# Patient Record
Sex: Female | Born: 1990
Health system: Southern US, Community
[De-identification: ages and names within clinical notes are randomized; demographics above are authoritative.]

## PROBLEM LIST (undated history)

## (undated) ENCOUNTER — Inpatient Hospital Stay (HOSPITAL_COMMUNITY): Payer: Self-pay

## (undated) DIAGNOSIS — Z3481 Encounter for supervision of other normal pregnancy, first trimester: Secondary | ICD-10-CM

## (undated) DIAGNOSIS — F329 Major depressive disorder, single episode, unspecified: Secondary | ICD-10-CM

## (undated) DIAGNOSIS — O1490 Unspecified pre-eclampsia, unspecified trimester: Secondary | ICD-10-CM

## (undated) DIAGNOSIS — F32A Depression, unspecified: Secondary | ICD-10-CM

## (undated) DIAGNOSIS — R011 Cardiac murmur, unspecified: Secondary | ICD-10-CM

## (undated) DIAGNOSIS — E7800 Pure hypercholesterolemia, unspecified: Secondary | ICD-10-CM

## (undated) HISTORY — DX: Cardiac murmur, unspecified: R01.1

## (undated) HISTORY — DX: Major depressive disorder, single episode, unspecified: F32.9

## (undated) HISTORY — DX: Unspecified pre-eclampsia, unspecified trimester: O14.90

## (undated) HISTORY — DX: Encounter for supervision of other normal pregnancy, first trimester: Z34.81

## (undated) HISTORY — DX: Pure hypercholesterolemia, unspecified: E78.00

## (undated) HISTORY — PX: WISDOM TOOTH EXTRACTION: SHX21

## (undated) HISTORY — DX: Depression, unspecified: F32.A

---
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
## 2012-04-23 ENCOUNTER — Emergency Department (HOSPITAL_COMMUNITY)
Admission: EM | Admit: 2012-04-23 | Discharge: 2012-04-23 | Disposition: A | Discharge status: Home / Self Care | Payer: Medicaid Other | Source: Home / Self Care

## 2012-06-05 ENCOUNTER — Encounter: Payer: Medicaid Other | Admitting: Family

## 2012-12-11 ENCOUNTER — Encounter: Payer: Self-pay | Admitting: Obstetrics & Gynecology

## 2012-12-11 ENCOUNTER — Ambulatory Visit (INDEPENDENT_AMBULATORY_CARE_PROVIDER_SITE_OTHER): Payer: Medicaid Other

## 2012-12-30 ENCOUNTER — Ambulatory Visit (INDEPENDENT_AMBULATORY_CARE_PROVIDER_SITE_OTHER): Payer: Medicaid Other

## 2012-12-30 ENCOUNTER — Encounter: Payer: Self-pay | Admitting: Advanced Practice Midwife

## 2012-12-30 ENCOUNTER — Ambulatory Visit (INDEPENDENT_AMBULATORY_CARE_PROVIDER_SITE_OTHER): Payer: Medicaid Other | Admitting: Advanced Practice Midwife

## 2012-12-30 ENCOUNTER — Other Ambulatory Visit: Payer: Medicaid Other

## 2012-12-30 ENCOUNTER — Other Ambulatory Visit: Payer: Self-pay | Admitting: Advanced Practice Midwife

## 2012-12-30 VITALS — BP 116/69 | Temp 98.3°F | Ht 63.5 in | Wt 148.2 lb

## 2012-12-30 DIAGNOSIS — Z3481 Encounter for supervision of other normal pregnancy, first trimester: Secondary | ICD-10-CM

## 2012-12-30 DIAGNOSIS — O365990 Maternal care for other known or suspected poor fetal growth, unspecified trimester, not applicable or unspecified: Secondary | ICD-10-CM

## 2012-12-30 DIAGNOSIS — N926 Irregular menstruation, unspecified: Secondary | ICD-10-CM

## 2012-12-30 DIAGNOSIS — O365911 Maternal care for other known or suspected poor fetal growth, first trimester, fetus 1: Secondary | ICD-10-CM

## 2012-12-30 DIAGNOSIS — Z8349 Family history of other endocrine, nutritional and metabolic diseases: Secondary | ICD-10-CM

## 2012-12-30 DIAGNOSIS — Z3480 Encounter for supervision of other normal pregnancy, unspecified trimester: Secondary | ICD-10-CM | POA: Insufficient documentation

## 2012-12-30 DIAGNOSIS — Z3201 Encounter for pregnancy test, result positive: Secondary | ICD-10-CM

## 2012-12-30 LAB — POCT URINALYSIS DIPSTICK
Blood, UA: NEGATIVE
Glucose, UA: NEGATIVE
Ketones, UA: NEGATIVE
Spec Grav, UA: 1.01
Urobilinogen, UA: NEGATIVE

## 2012-12-30 LAB — US OB COMP LESS 14 WKS

## 2012-12-30 NOTE — Addendum Note (Signed)
Addended by: George Hugh on: 12/30/2012 03:50 PM   Modules accepted: Orders

## 2012-12-30 NOTE — Progress Notes (Signed)
  Pulse- 83  Subjective:    Erin Ruiz is a G2P1001 [redacted]w[redacted]d being seen today for her first obstetrical visit.  Her obstetrical history is significant for NA. Patient does intend to breast feed. Pregnancy history fully reviewed.  Patient reports no complaints.  Filed Vitals:   12/30/12 1321 12/30/12 1322  BP: 116/69   Temp: 98.3 F (36.8 C)   Height:  5' 3.5" (1.613 m)  Weight: 148 lb 3.2 oz (67.223 kg)     HISTORY: OB History  Gravida Para Term Preterm AB SAB TAB Ectopic Multiple Living  2 1 1       1     # Outcome Date GA Lbr Len/2nd Weight Sex Delivery Anes PTL Lv  2 CUR           1 TRM 12/30/09 [redacted]w[redacted]d  7 lb 1 oz (3.204 kg) F SVD None  Y     Comments: No complications     Past Medical History  Diagnosis Date  . Heart murmur    Past Surgical History  Procedure Laterality Date  . Tonsillectomy     Family History  Problem Relation Age of Onset  . Hypertension Mother   . Asthma Father      Exam    Filed Vitals:   12/30/12 1321  BP: 116/69  Temp: 98.3 F (36.8 C)   Filed Vitals:   12/30/12 1322  Height: 5' 3.5" (1.613 m)  Weight:    Pelvic US pending   Assessment:    Pregnancy: G2P1001 Patient Active Problem List   Diagnosis Date Noted  . Irregular menses 12/30/2012  . Supervision of other normal pregnancy 12/30/2012        Plan:     Initial labs drawn. Prenatal vitamins. Problem list reviewed and updated. Genetic Screening discussed Quad Screen: at subsequent visits.  Ultrasound discussed; fetal survey: requested.  Follow up in 4 weeks. 80% of 30 min visit spent on counseling and coordination of care.  Patient needs GC/Ct and Pap NV (unable to today due to toddler in room) Dating [place] pending CF ordered w/ NOB labs due to family hx Make certain to assess murmur w/ PE NV.    Erin Ruiz 12/30/2012

## 2012-12-31 ENCOUNTER — Encounter: Payer: Self-pay | Admitting: Advanced Practice Midwife

## 2012-12-31 LAB — OBSTETRIC PANEL
Basophils Absolute: 0 10*3/uL (ref 0.0–0.1)
Eosinophils Relative: 1 % (ref 0–5)
Hemoglobin: 12.2 g/dL (ref 12.0–15.0)
Hepatitis B Surface Ag: NEGATIVE
Lymphocytes Relative: 19 % (ref 12–46)
Lymphs Abs: 2.1 10*3/uL (ref 0.7–4.0)
MCV: 88.3 fL (ref 78.0–100.0)
Neutro Abs: 7.7 10*3/uL (ref 1.7–7.7)
Neutrophils Relative %: 73 % (ref 43–77)
Platelets: 214 10*3/uL (ref 150–400)
RBC: 4.03 MIL/uL (ref 3.87–5.11)
RDW: 13.3 % (ref 11.5–15.5)
Rubella: 1.92 Index — ABNORMAL HIGH (ref <0.90)
WBC: 10.6 10*3/uL — ABNORMAL HIGH (ref 4.0–10.5)

## 2012-12-31 LAB — CULTURE, OB URINE: Colony Count: NO GROWTH

## 2012-12-31 LAB — VITAMIN D 25 HYDROXY (VIT D DEFICIENCY, FRACTURES): Vit D, 25-Hydroxy: 22 ng/mL — ABNORMAL LOW (ref 30–89)

## 2012-12-31 LAB — HIV ANTIBODY (ROUTINE TESTING W REFLEX): HIV: NONREACTIVE

## 2013-01-01 LAB — CYSTIC FIBROSIS DIAGNOSTIC STUDY

## 2013-01-06 ENCOUNTER — Other Ambulatory Visit: Payer: Medicaid Other

## 2013-01-08 ENCOUNTER — Other Ambulatory Visit: Payer: Self-pay

## 2013-01-23 LAB — OB RESULTS CONSOLE GC/CHLAMYDIA
Chlamydia: NEGATIVE
Gonorrhea: NEGATIVE

## 2013-01-27 ENCOUNTER — Ambulatory Visit (INDEPENDENT_AMBULATORY_CARE_PROVIDER_SITE_OTHER): Payer: Medicaid Other | Admitting: Advanced Practice Midwife

## 2013-01-27 VITALS — BP 113/72 | Temp 98.3°F | Wt 155.6 lb

## 2013-01-27 DIAGNOSIS — Z3480 Encounter for supervision of other normal pregnancy, unspecified trimester: Secondary | ICD-10-CM

## 2013-01-27 DIAGNOSIS — Z3481 Encounter for supervision of other normal pregnancy, first trimester: Secondary | ICD-10-CM

## 2013-01-27 LAB — POCT URINALYSIS DIPSTICK
Blood, UA: NEGATIVE
Ketones, UA: NEGATIVE
Leukocytes, UA: NEGATIVE
Protein, UA: NEGATIVE
Urobilinogen, UA: NEGATIVE
pH, UA: 5

## 2013-01-27 NOTE — Progress Notes (Signed)
Pulse: 91

## 2013-01-27 NOTE — Progress Notes (Signed)
Routine Obstetrical Visit  Subjective:    Erin Ruiz is being seen today for her routine obstetrical visit. She is at [redacted]w[redacted]d gestation.   Just returned from visiting her family in [place].  Had a normal pap smear 1 year ago, no history of abnormals.   Patient reports no complaints.   Objective:     BP 113/72  Temp(Src) 98.3 F (36.8 C)  Wt 155 lb 9.6 oz (70.58 kg)  LMP 10/27/2012 Physical Exam  Exam Physical Examination: General appearance - alert, well appearing, and in no distress Mental status - alert, oriented to person, place, and time Eyes - pupils equal and reactive, extraocular eye movements intact Nose - normal and patent, no erythema, discharge or polyps Mouth - mucous membranes moist, pharynx normal without lesions Neck - supple, no significant adenopathy Lymphatics - no palpable lymphadenopathy, no hepatosplenomegaly Chest - clear to auscultation, no wheezes, rales or rhonchi, symmetric air entry Heart - normal rate, regular rhythm, normal S1, S2, no murmurs, rubs, clicks or gallops Abdomen - soft, nontender, nondistended, no masses or organomegaly Breasts - breasts appear normal, no suspicious masses, no skin or nipple changes or axillary nodes Musculoskeletal - no joint tenderness, deformity or swelling Skin - normal coloration and turgor, no rashes, no suspicious skin lesions noted    Assessment:    Pregnancy: G2P1001 Patient Active Problem List   Diagnosis Date Noted  . Irregular menses 12/30/2012  . Supervision of other normal pregnancy 12/30/2012       Plan:     Prenatal vitamins. Problem list reviewed and updated.  Plan quad NV. Follow up in 4 weeks. Urine GC/CT today Pap deferred, due in 2 years.   80% of 20 min visit spent on counseling and coordination of care.     Erin Ruiz 01/27/2013

## 2013-01-28 LAB — GC/CHLAMYDIA PROBE AMP: CT Probe RNA: NEGATIVE

## 2013-02-24 ENCOUNTER — Encounter: Payer: Self-pay | Admitting: Advanced Practice Midwife

## 2013-02-24 ENCOUNTER — Ambulatory Visit (INDEPENDENT_AMBULATORY_CARE_PROVIDER_SITE_OTHER): Payer: Medicaid Other | Admitting: Advanced Practice Midwife

## 2013-02-24 VITALS — BP 118/63 | Temp 98.1°F | Wt 164.0 lb

## 2013-02-24 DIAGNOSIS — Z3480 Encounter for supervision of other normal pregnancy, unspecified trimester: Secondary | ICD-10-CM

## 2013-02-24 DIAGNOSIS — Z3482 Encounter for supervision of other normal pregnancy, second trimester: Secondary | ICD-10-CM

## 2013-02-24 LAB — POCT URINALYSIS DIPSTICK
Blood, UA: NEGATIVE
Glucose, UA: NEGATIVE
Protein, UA: NEGATIVE
Spec Grav, UA: 1.02
Urobilinogen, UA: NEGATIVE

## 2013-02-24 MED ORDER — OB COMPLETE PETITE 35-5-1-200 MG PO CAPS: 1.0000 | ORAL_CAPSULE | Freq: Every day | ORAL | Status: DC

## 2013-02-24 NOTE — Progress Notes (Signed)
Patient doing well. Discussed comfort measures. Quad screen today and scheduled [place]. RTC in 4 weeks. Daily Doe Wilson Singer CNM

## 2013-02-24 NOTE — Progress Notes (Signed)
HR - 101 Pt in office today for routine OB visit, reports having frequent headaches recently, having lower abd pressure

## 2013-03-02 LAB — AFP, QUAD SCREEN
AFP: 30.6 IU/mL
Age Alone: 1:1150 {titer}
Curr Gest Age: 16.1 wks.days
Down Syndrome Scr Risk Est: 1:35500 {titer}
HCG, Total: 20458 m[IU]/mL
INH: 133.5 pg/mL
MoM for INH: 0.77
MoM for hCG: 0.89
Open Spina bifida: NEGATIVE
Osb Risk: 1:11600 {titer}

## 2013-03-05 NOTE — L&D Delivery Note (Signed)
Delivery Note At 2:24 PM a viable female was delivered via Vaginal, Spontaneous Delivery (Presentation: Left Occiput Anterior).  APGAR: 9, 9; weight .   Placenta status: Intact, Spontaneous.  Cord: 3 vessels with the following complications: None.  Cord pH: none  Anesthesia: Epidural  Episiotomy: None Lacerations: none Suture Repair: none Est. Blood Loss (mL): 350  Mom to postpartum.  Baby to Couplet care / Skin to Skin.  Brock Bad 07/30/2013, 2:54 PM

## 2013-03-09 ENCOUNTER — Other Ambulatory Visit: Payer: Self-pay | Admitting: *Deleted

## 2013-03-09 DIAGNOSIS — Z1389 Encounter for screening for other disorder: Secondary | ICD-10-CM

## 2013-03-11 ENCOUNTER — Other Ambulatory Visit: Payer: Medicaid Other

## 2013-03-11 ENCOUNTER — Ambulatory Visit (INDEPENDENT_AMBULATORY_CARE_PROVIDER_SITE_OTHER): Payer: Medicaid Other

## 2013-03-11 ENCOUNTER — Encounter: Payer: Self-pay | Admitting: Obstetrics & Gynecology

## 2013-03-11 DIAGNOSIS — Z363 Encounter for antenatal screening for malformations: Secondary | ICD-10-CM

## 2013-03-11 DIAGNOSIS — Z1389 Encounter for screening for other disorder: Secondary | ICD-10-CM

## 2013-03-11 DIAGNOSIS — O365990 Maternal care for other known or suspected poor fetal growth, unspecified trimester, not applicable or unspecified: Secondary | ICD-10-CM

## 2013-03-11 DIAGNOSIS — Z3481 Encounter for supervision of other normal pregnancy, first trimester: Secondary | ICD-10-CM

## 2013-03-11 LAB — US OB DETAIL + 14 WK

## 2013-03-24 ENCOUNTER — Ambulatory Visit (INDEPENDENT_AMBULATORY_CARE_PROVIDER_SITE_OTHER): Payer: Medicaid Other | Admitting: Advanced Practice Midwife

## 2013-03-24 VITALS — BP 121/74 | Temp 98.1°F | Wt 171.0 lb

## 2013-03-24 DIAGNOSIS — R51 Headache: Secondary | ICD-10-CM

## 2013-03-24 DIAGNOSIS — R519 Headache, unspecified: Secondary | ICD-10-CM

## 2013-03-24 DIAGNOSIS — Z3480 Encounter for supervision of other normal pregnancy, unspecified trimester: Secondary | ICD-10-CM

## 2013-03-24 DIAGNOSIS — O26899 Other specified pregnancy related conditions, unspecified trimester: Secondary | ICD-10-CM

## 2013-03-24 LAB — POCT URINALYSIS DIPSTICK
Bilirubin, UA: NEGATIVE
GLUCOSE UA: NEGATIVE
Ketones, UA: NEGATIVE
NITRITE UA: NEGATIVE
PH UA: 8
Protein, UA: NEGATIVE
RBC UA: NEGATIVE
Spec Grav, UA: 1.01
Urobilinogen, UA: NEGATIVE

## 2013-03-24 MED ORDER — BUTALBITAL-APAP-CAFFEINE 50-325-40 MG PO TABS: 1.0000 | ORAL_TABLET | Freq: Four times a day (QID) | ORAL | Status: DC | PRN

## 2013-03-24 NOTE — Progress Notes (Signed)
Subjective: Erin Ruiz is a 23 y.o. at 20 weeks by early ultrasound  Patient denies vaginal leaking of fluid or bleeding, denies contractions.  Reports positive fetal movment. Expecting a girl.  Patient reports increased anxiety thinking about delivery. Reports she doesn't want anyone other than her husband with her in the delivery room. She reports her in laws expect to be there and that is terrifying to her. Reports she had a bad first experience where the nursing staff was mean to her, she was crying and felt very anxious. She would like the epidural to help w/ her pain and anxiety but her partner does not want her to get one because it is bad for the baby.   Her husband has been incarcerated. Her mother is currently incarcerated and should get out by April. Her immediate family is not very helpful.   Patient reports daily HA, in the back of her neck and occasionally on her forehead. It last most of the day. Not relieved w/ 1000 mg of tylenol, she generally takes that once daily.   Objective: Filed Vitals:   03/24/13 1043  BP: 121/74  Temp: 98.1 F (36.7 C)   140 FHR 20 Fundal Height Fetal Position unknown  Assessment: Patient Active Problem List   Diagnosis Date Noted  . Irregular menses 12/30/2012  . Supervision of other normal pregnancy 12/30/2012    Plan: Patient to return to clinic in 4 weeks Reviewed measures to help w/ HA, encouraged hydration. Fioricet #10 given. Avoid w/ tylenol, don't take at bedtime.  Reviewed warning signs in pregnancy. Patient to call with concerns PRN. Reviewed triage location. Patient may ask hospital staff to ask family to leave if necessary during delivery. Discussed pros and cons of epidural in labor and discussed if it helped reduce her anxiety it may be beneficial in the labor process. Discuss GCT NV. Reviewed US.  Offie Pickron Wilson SingerWren CNM

## 2013-03-24 NOTE — Progress Notes (Signed)
Pulse 96 Pt states that she is having headaches daily.  Pt has been taking tylenol that is having little relief now. Pt is having some pain in her lower right groin pain.

## 2013-04-21 ENCOUNTER — Encounter: Payer: Medicaid Other | Admitting: Advanced Practice Midwife

## 2013-04-23 ENCOUNTER — Ambulatory Visit (INDEPENDENT_AMBULATORY_CARE_PROVIDER_SITE_OTHER): Payer: Medicaid Other | Admitting: Obstetrics

## 2013-04-23 VITALS — BP 123/70 | Temp 97.3°F | Wt 180.0 lb

## 2013-04-23 DIAGNOSIS — Z3480 Encounter for supervision of other normal pregnancy, unspecified trimester: Secondary | ICD-10-CM

## 2013-04-23 LAB — POCT URINALYSIS DIPSTICK
Bilirubin, UA: NEGATIVE
Blood, UA: NEGATIVE
Glucose, UA: NEGATIVE
Ketones, UA: NEGATIVE
Leukocytes, UA: NEGATIVE
Nitrite, UA: NEGATIVE
Protein, UA: NEGATIVE
SPEC GRAV UA: 1.015
Urobilinogen, UA: NEGATIVE
pH, UA: 5

## 2013-04-23 NOTE — Progress Notes (Signed)
Pulse: 90

## 2013-04-24 ENCOUNTER — Encounter: Payer: Self-pay | Admitting: Obstetrics

## 2013-04-27 ENCOUNTER — Other Ambulatory Visit (INDEPENDENT_AMBULATORY_CARE_PROVIDER_SITE_OTHER): Payer: Medicaid Other

## 2013-04-27 ENCOUNTER — Other Ambulatory Visit: Payer: Self-pay | Admitting: Obstetrics

## 2013-04-27 VITALS — BP 111/72 | HR 88 | Temp 98.2°F | Ht 63.0 in | Wt 184.0 lb

## 2013-04-27 DIAGNOSIS — N390 Urinary tract infection, site not specified: Secondary | ICD-10-CM

## 2013-04-27 DIAGNOSIS — R399 Unspecified symptoms and signs involving the genitourinary system: Secondary | ICD-10-CM

## 2013-04-27 DIAGNOSIS — R3989 Other symptoms and signs involving the genitourinary system: Secondary | ICD-10-CM

## 2013-04-27 LAB — POCT URINALYSIS DIPSTICK: pH, UA: 8

## 2013-04-27 MED ORDER — NITROFURANTOIN MONOHYD MACRO 100 MG PO CAPS: 100.0000 mg | ORAL_CAPSULE | Freq: Two times a day (BID) | ORAL | Status: DC

## 2013-04-27 NOTE — Progress Notes (Signed)
Patient states she is having burning with urination and when she stands up. Patient denies any itching, irritation, or vaginal odor. Patient states her vaginal discharge is the same it has been. Patient states urgency and frequency is no more then normal. Patient states it feels like she has to go but she doesn't. Patient states she has been feeling very dehydrated lately. Patient states she drinks 4-6 of the 12oz bottles of water a day, a glass of milk and 2 glasses of juice.

## 2013-04-27 NOTE — Addendum Note (Signed)
Addended by: Marya LandryFOSTER, Ules Marsala D on: 04/27/2013 01:41 PM   Modules accepted: Orders

## 2013-04-28 LAB — CULTURE, OB URINE
Colony Count: NO GROWTH
Organism ID, Bacteria: NO GROWTH

## 2013-05-05 ENCOUNTER — Other Ambulatory Visit: Payer: Medicaid Other

## 2013-05-05 ENCOUNTER — Ambulatory Visit (INDEPENDENT_AMBULATORY_CARE_PROVIDER_SITE_OTHER): Payer: Medicaid Other | Admitting: Advanced Practice Midwife

## 2013-05-05 VITALS — BP 103/66 | Temp 98.1°F | Wt 188.0 lb

## 2013-05-05 DIAGNOSIS — Z3480 Encounter for supervision of other normal pregnancy, unspecified trimester: Secondary | ICD-10-CM

## 2013-05-05 DIAGNOSIS — R300 Dysuria: Secondary | ICD-10-CM

## 2013-05-05 LAB — CBC
HCT: 35.3 % — ABNORMAL LOW (ref 36.0–46.0)
HEMOGLOBIN: 11.8 g/dL — AB (ref 12.0–15.0)
MCH: 29.8 pg (ref 26.0–34.0)
MCHC: 33.4 g/dL (ref 30.0–36.0)
MCV: 89.1 fL (ref 78.0–100.0)
Platelets: 187 10*3/uL (ref 150–400)
RBC: 3.96 MIL/uL (ref 3.87–5.11)
RDW: 13 % (ref 11.5–15.5)
WBC: 10 10*3/uL (ref 4.0–10.5)

## 2013-05-05 LAB — POCT URINALYSIS DIPSTICK
Bilirubin, UA: NEGATIVE
Glucose, UA: NEGATIVE
Ketones, UA: NEGATIVE
Leukocytes, UA: NEGATIVE
Nitrite, UA: NEGATIVE
PROTEIN UA: NEGATIVE
RBC UA: NEGATIVE
SPEC GRAV UA: 1.01
UROBILINOGEN UA: NEGATIVE
pH, UA: 7

## 2013-05-05 NOTE — Progress Notes (Signed)
Pulse:80 Patient denise any concerns.

## 2013-05-05 NOTE — Progress Notes (Signed)
Subjective: Erin Ruiz is a 23 y.o. at 26 weeks by early ultrasound  Patient denies vaginal leaking of fluid or bleeding, denies contractions.  Reports positive fetal movment.  Denies concerns today. Reports improvement of symptoms following antibiotic.  Objective: Filed Vitals:   05/05/13 0929  BP: 103/66  Temp: 98.1 F (36.7 C)   150 FHR 26 Fundal Height Fetal Position cephalic  Assessment: Patient Active Problem List   Diagnosis Date Noted  . Urinary tract infection, site not specified 04/27/2013  . Irregular menses 12/30/2012  . Supervision of other normal pregnancy 12/30/2012    Plan: Patient to return to clinic in 2-4 weeks S/P treatment for suspected UTI, improvement of symptoms. (UC was negative) Continue to monitor.  Glucose screening today, review NV Reviewed comfort measures in pregnancy Reviewed warning signs in pregnancy. Patient to call with concerns PRN. Reviewed triage location.  Susa Bones Wilson SingerWren CNM

## 2013-05-06 LAB — GLUCOSE TOLERANCE, 2 HOURS W/ 1HR
GLUCOSE, 2 HOUR: 106 mg/dL (ref 70–139)
Glucose, 1 hour: 107 mg/dL (ref 70–170)
Glucose, Fasting: 63 mg/dL — ABNORMAL LOW (ref 70–99)

## 2013-05-06 LAB — CULTURE, OB URINE
Colony Count: NO GROWTH
Organism ID, Bacteria: NO GROWTH

## 2013-05-06 LAB — HIV ANTIBODY (ROUTINE TESTING W REFLEX): HIV: NONREACTIVE

## 2013-05-06 LAB — RPR

## 2013-05-07 ENCOUNTER — Other Ambulatory Visit: Payer: Medicaid Other

## 2013-05-19 ENCOUNTER — Encounter: Payer: Self-pay | Admitting: Obstetrics

## 2013-05-19 ENCOUNTER — Encounter: Payer: Medicaid Other | Admitting: Advanced Practice Midwife

## 2013-05-19 ENCOUNTER — Ambulatory Visit (INDEPENDENT_AMBULATORY_CARE_PROVIDER_SITE_OTHER): Payer: Medicaid Other | Admitting: Obstetrics

## 2013-05-19 VITALS — BP 116/73 | Temp 97.6°F | Wt 193.0 lb

## 2013-05-19 DIAGNOSIS — Z3480 Encounter for supervision of other normal pregnancy, unspecified trimester: Secondary | ICD-10-CM

## 2013-05-19 LAB — POCT URINALYSIS DIPSTICK
Bilirubin, UA: NEGATIVE
Blood, UA: NEGATIVE
Ketones, UA: NEGATIVE
Leukocytes, UA: NEGATIVE
Nitrite, UA: NEGATIVE
PH UA: 6.5
Spec Grav, UA: 1.01
UROBILINOGEN UA: NEGATIVE

## 2013-05-19 NOTE — Progress Notes (Signed)
Pulse: 91 Patient states she is having some pelvic pain. Patient states she was having some period like cramps Thursday, Patient states she went home and laid down and felt better. Patient states she hasn't had any since.

## 2013-06-02 ENCOUNTER — Ambulatory Visit (INDEPENDENT_AMBULATORY_CARE_PROVIDER_SITE_OTHER): Payer: Medicaid Other | Admitting: Obstetrics

## 2013-06-02 VITALS — BP 123/77 | Temp 98.2°F | Wt 192.0 lb

## 2013-06-02 DIAGNOSIS — Z3480 Encounter for supervision of other normal pregnancy, unspecified trimester: Secondary | ICD-10-CM

## 2013-06-02 LAB — POCT URINALYSIS DIPSTICK
Bilirubin, UA: NEGATIVE
Blood, UA: NEGATIVE
Glucose, UA: NEGATIVE
Ketones, UA: NEGATIVE
Nitrite, UA: NEGATIVE
PROTEIN UA: NEGATIVE
SPEC GRAV UA: 1.01
UROBILINOGEN UA: NEGATIVE
pH, UA: 6

## 2013-06-02 NOTE — Progress Notes (Signed)
Pulse 93 Pt states that she has been having increase in lower pelvic cramps.  Pt has also had some contractions.  Pt states that this has gotten worse over the past few days.  Pt states as though the cramps are like bad menstral cramps.

## 2013-06-06 ENCOUNTER — Encounter: Payer: Self-pay | Admitting: Obstetrics

## 2013-06-14 ENCOUNTER — Encounter (HOSPITAL_COMMUNITY): Payer: Self-pay | Admitting: Emergency Medicine

## 2013-06-14 ENCOUNTER — Emergency Department (HOSPITAL_COMMUNITY)
Admission: EM | Admit: 2013-06-14 | Discharge: 2013-06-14 | Disposition: A | Discharge status: Home / Self Care | Payer: Medicaid Other | Source: Home / Self Care

## 2013-06-14 DIAGNOSIS — H918X9 Other specified hearing loss, unspecified ear: Secondary | ICD-10-CM

## 2013-06-14 DIAGNOSIS — H6120 Impacted cerumen, unspecified ear: Secondary | ICD-10-CM

## 2013-06-14 NOTE — Discharge Instructions (Signed)
Cerumen Impaction A cerumen impaction is when the wax in your ear forms a plug. This plug usually causes reduced hearing. Sometimes it also causes an earache or dizziness. Removing a cerumen impaction can be difficult and painful. The wax sticks to the ear canal. The canal is sensitive and bleeds easily. If you try to remove a heavy wax buildup with a cotton tipped swab, you may push it in further. Irrigation with water, suction, and small ear curettes may be used to clear out the wax. If the impaction is fixed to the skin in the ear canal, ear drops may be needed for a few days to loosen the wax. People who build up a lot of wax frequently can use ear wax removal products available in your local drugstore. SEEK MEDICAL CARE IF:  You develop an earache, increased hearing loss, or marked dizziness. Document Released: 03/29/2004 Document Revised: 05/14/2011 Document Reviewed: 05/19/2009 Exit[employer] Patient Information 2014 [employer].  

## 2013-06-14 NOTE — ED Provider Notes (Signed)
Medical screening examination/treatment/procedure(s) were performed by resident physician or non-physician practitioner and as supervising physician I was immediately available for consultation/collaboration.   Ancelmo Hunt DOUGLAS MD.   Gustave Lindeman D Joson Sapp, MD 06/14/13 2043 

## 2013-06-14 NOTE — ED Provider Notes (Signed)
CSN: [id number]     Arrival date & time 06/14/13  1031 History   First MD Initiated Contact with Patient 06/14/13 1224     Chief Complaint  Patient presents with  . Cerumen Impaction   (Consider location/radiation/quality/duration/timing/severity/associated sxs/prior Treatment) HPI Comments: Decreased hearing L ear after cleaning L ear with Q tip last night. No pain   Past Medical History  Diagnosis Date  . Heart murmur    Past Surgical History  Procedure Laterality Date  . Tonsillectomy     Family History  Problem Relation Age of Onset  . Hypertension Mother   . Asthma Father    History  Substance Use Topics  . Smoking status: Former Games developermoker  . Smokeless tobacco: Not on file  . Alcohol Use: No   OB History   Grav Para Term Preterm Abortions TAB SAB Ect Mult Living   2 1 1       1      Review of Systems  Constitutional: Negative.   HENT: Positive for hearing loss. Negative for ear discharge and ear pain.   Respiratory: Negative.     Allergies  Review of patient's allergies indicates no known allergies.  Home Medications   Current Outpatient Rx  Name  Route  Sig  Dispense  Refill  . Prenat-FeCbn-FeAspGl-FA-Omega (OB COMPLETE PETITE) 35-5-1-200 MG CAPS   Oral   Take 1 tablet by mouth daily.   60 capsule   6   . acetaminophen (TYLENOL) 500 MG tablet   Oral   Take 500 mg by mouth every 6 (six) hours as needed.         . butalbital-acetaminophen-caffeine (FIORICET) 50-325-40 MG per tablet   Oral   Take 1-2 tablets by mouth every 6 (six) hours as needed for headache.   10 tablet   0    BP 116/73  Pulse 96  Temp(Src) 98.2 F (36.8 C) (Oral)  Resp 18  SpO2 100%  LMP 10/27/2012 Physical Exam  Nursing note and vitals reviewed. Constitutional: She is oriented to person, place, and time. She appears well-developed and well-nourished. No distress.  HENT:  Right Ear: External ear normal.  Mouth/Throat: Oropharynx is clear and moist. No oropharyngeal  exudate.  L TM, post irrigation, mildly and partially injected. No effusion. EAC clear.  Neck: Neck supple.  Neurological: She is alert and oriented to person, place, and time.  Skin: Skin is warm and dry.    ED Course  Procedures (including critical care time) Labs Review Labs Reviewed - No data to display Imaging Review No results found.   MDM   1. Hearing loss secondary to cerumen impaction     L ear irrigated till clear Partial TM with minor erythema most likely due to pronged pressure of cerumen. EAC now clear    Hayden Rasmussenavid Riki Gehring, NP 06/14/13 1236

## 2013-06-14 NOTE — ED Notes (Signed)
States used Q-tip yesterday to clean left ear when she had sudden onset left hearing loss.  Has used olive oil without any relief.  Ear canal completely blocked.

## 2013-06-15 ENCOUNTER — Ambulatory Visit (INDEPENDENT_AMBULATORY_CARE_PROVIDER_SITE_OTHER): Payer: Medicaid Other | Admitting: Obstetrics

## 2013-06-15 ENCOUNTER — Encounter: Payer: Self-pay | Admitting: Obstetrics

## 2013-06-15 VITALS — BP 115/72 | Temp 98.4°F | Wt 195.0 lb

## 2013-06-15 DIAGNOSIS — Z3480 Encounter for supervision of other normal pregnancy, unspecified trimester: Secondary | ICD-10-CM

## 2013-06-15 LAB — POCT URINALYSIS DIPSTICK
BILIRUBIN UA: NEGATIVE
Glucose, UA: NEGATIVE
KETONES UA: NEGATIVE
Leukocytes, UA: NEGATIVE
Nitrite, UA: NEGATIVE
Protein, UA: NEGATIVE
RBC UA: NEGATIVE
SPEC GRAV UA: 1.01
Urobilinogen, UA: NEGATIVE
pH, UA: 6.5

## 2013-06-15 NOTE — Progress Notes (Signed)
Pulse 88 Pt is doing well

## 2013-06-16 ENCOUNTER — Encounter: Payer: Medicaid Other | Admitting: Obstetrics

## 2013-06-29 ENCOUNTER — Encounter: Payer: Self-pay | Admitting: Obstetrics & Gynecology

## 2013-06-29 ENCOUNTER — Ambulatory Visit (INDEPENDENT_AMBULATORY_CARE_PROVIDER_SITE_OTHER): Payer: Medicaid Other | Admitting: Obstetrics & Gynecology

## 2013-06-29 VITALS — BP 124/78 | HR 102 | Temp 98.0°F | Wt 197.0 lb

## 2013-06-29 DIAGNOSIS — Z3480 Encounter for supervision of other normal pregnancy, unspecified trimester: Secondary | ICD-10-CM

## 2013-06-29 DIAGNOSIS — O365990 Maternal care for other known or suspected poor fetal growth, unspecified trimester, not applicable or unspecified: Secondary | ICD-10-CM

## 2013-06-29 NOTE — Addendum Note (Signed)
Addended by: Glendell DockerKNIGHT, Ezrie Bunyan on: 06/29/2013 03:54 PM   Modules accepted: Orders

## 2013-06-29 NOTE — Progress Notes (Signed)
Subjective:    Erin Ruiz is a 23 y.o. female being seen today for her obstetrical visit. She is at 3655w0d gestation. Patient reports no complaints. Fetal movement: normal.  Problem List Items Addressed This Visit   Supervision of other normal pregnancy - Primary     Patient Active Problem List   Diagnosis Date Noted  . Urinary tract infection, site not specified 04/27/2013  . Irregular menses 12/30/2012  . Supervision of other normal pregnancy 12/30/2012   Objective:    BP 124/78  Pulse 102  Temp(Src) 98 F (36.7 C)  Wt 89.359 kg (197 lb)  LMP 10/27/2012 FHT:  120 BPM  Uterine Size: size equals dates  Presentation: cephalic     Assessment:    Pregnancy @ 9055w0d weeks   Plan:     labs reviewed, problem list updated Pediatrician: discussed. Infant feeding: plans to breastfeed. Maternity leave: discussed. Cigarette smoking: quit.  Follow up in 2 Weeks.

## 2013-07-07 ENCOUNTER — Ambulatory Visit (INDEPENDENT_AMBULATORY_CARE_PROVIDER_SITE_OTHER): Payer: Medicaid Other

## 2013-07-07 ENCOUNTER — Other Ambulatory Visit: Payer: Self-pay | Admitting: Obstetrics & Gynecology

## 2013-07-07 DIAGNOSIS — O365990 Maternal care for other known or suspected poor fetal growth, unspecified trimester, not applicable or unspecified: Secondary | ICD-10-CM

## 2013-07-07 DIAGNOSIS — Z3480 Encounter for supervision of other normal pregnancy, unspecified trimester: Secondary | ICD-10-CM

## 2013-07-08 ENCOUNTER — Encounter: Payer: Self-pay | Admitting: Obstetrics & Gynecology

## 2013-07-08 LAB — US OB FOLLOW UP

## 2013-07-09 ENCOUNTER — Encounter: Payer: Self-pay | Admitting: Advanced Practice Midwife

## 2013-07-09 LAB — US OB FOLLOW UP

## 2013-07-14 ENCOUNTER — Encounter: Payer: Self-pay | Admitting: Advanced Practice Midwife

## 2013-07-14 ENCOUNTER — Encounter: Payer: Medicaid Other | Admitting: Advanced Practice Midwife

## 2013-07-14 ENCOUNTER — Ambulatory Visit (INDEPENDENT_AMBULATORY_CARE_PROVIDER_SITE_OTHER): Payer: Medicaid Other | Admitting: Advanced Practice Midwife

## 2013-07-14 VITALS — BP 132/81 | HR 94 | Temp 98.0°F | Wt 202.0 lb

## 2013-07-14 DIAGNOSIS — Z3480 Encounter for supervision of other normal pregnancy, unspecified trimester: Secondary | ICD-10-CM

## 2013-07-14 LAB — POCT URINALYSIS DIPSTICK
Blood, UA: NEGATIVE
GLUCOSE UA: NEGATIVE
Ketones, UA: NEGATIVE
Leukocytes, UA: NEGATIVE
Nitrite, UA: NEGATIVE
Protein, UA: NEGATIVE
SPEC GRAV UA: 1.02
pH, UA: 6

## 2013-07-14 NOTE — Progress Notes (Signed)
Subjective: Erin Ruiz is a 23 y.o. at 36 weeks by early ultrasound  Patient denies vaginal leaking of fluid or bleeding, denies contractions.  Reports positive fetal movment.  Denies concerns today. Doing well. Ready for delivery.  Objective: Filed Vitals:   07/14/13 1515  BP: 132/81  Pulse: 94  Temp: 98 F (36.7 C)   140 FHR 36 Fundal Height Fetal Position cephalic 2.5/70/-3  Assessment: Patient Active Problem List   Diagnosis Date Noted  . Urinary tract infection, site not specified 04/27/2013  . Supervision of other normal pregnancy 12/30/2012    Plan: Patient to return to clinic in 1 weeks GBS today Reviewed warning signs in pregnancy. Patient to call with concerns PRN. Reviewed triage location.   Laverta Harnisch Wilson SingerWren CNM

## 2013-07-16 LAB — STREP B DNA PROBE: STREP GROUP B AG: NOT DETECTED

## 2013-07-18 ENCOUNTER — Inpatient Hospital Stay (HOSPITAL_COMMUNITY)
Admission: AD | Admit: 2013-07-18 | Discharge: 2013-07-18 | Disposition: A | Discharge status: Home / Self Care | Payer: Medicaid Other | Source: Ambulatory Visit | Attending: Obstetrics | Admitting: Obstetrics

## 2013-07-18 ENCOUNTER — Encounter (HOSPITAL_COMMUNITY): Payer: Self-pay

## 2013-07-18 DIAGNOSIS — O99891 Other specified diseases and conditions complicating pregnancy: Secondary | ICD-10-CM | POA: Insufficient documentation

## 2013-07-18 DIAGNOSIS — Z87891 Personal history of nicotine dependence: Secondary | ICD-10-CM | POA: Insufficient documentation

## 2013-07-18 DIAGNOSIS — O9989 Other specified diseases and conditions complicating pregnancy, childbirth and the puerperium: Principal | ICD-10-CM

## 2013-07-18 DIAGNOSIS — L0591 Pilonidal cyst without abscess: Secondary | ICD-10-CM | POA: Insufficient documentation

## 2013-07-18 DIAGNOSIS — M549 Dorsalgia, unspecified: Secondary | ICD-10-CM | POA: Insufficient documentation

## 2013-07-18 MED ORDER — OXYCODONE-ACETAMINOPHEN 5-325 MG PO TABS
2.0000 | ORAL_TABLET | ORAL | Status: AC
  Administered 2013-07-18: 2 via ORAL
  Filled 2013-07-18: qty 2

## 2013-07-18 MED ORDER — OXYCODONE-ACETAMINOPHEN 5-325 MG PO TABS: 1.0000 | ORAL_TABLET | Freq: Four times a day (QID) | ORAL | Status: DC | PRN

## 2013-07-18 NOTE — Discharge Instructions (Signed)
Pilonidal Cyst A pilonidal cyst occurs when hairs get trapped (ingrown) beneath the skin in the crease between the buttocks over your sacrum (the bone under that crease). Pilonidal cysts are most common in young men with a lot of body hair. When the cyst is ruptured (breaks) or leaking, fluid from the cyst may cause burning and itching. If the cyst becomes infected, it causes a painful swelling filled with pus (abscess). The pus and trapped hairs need to be removed (often by lancing) so that the infection can heal. However, recurrence is common and an operation may be needed to remove the cyst. HOME CARE INSTRUCTIONS   If the cyst was NOT INFECTED:  Keep the area clean and dry. Bathe or shower daily. Wash the area well with a germ-killing soap. Warm tub baths may help prevent infection and help with drainage. Dry the area well with a towel.  Avoid tight clothing to keep area as moisture free as possible.  Keep area between buttocks as free of hair as possible. A depilatory may be used.  If the cyst WAS INFECTED and needed to be drained:  Your caregiver packed the wound with gauze to keep the wound open. This allows the wound to heal from the inside outwards and continue draining.  Return for a wound check in 1 day or as suggested.  If you take tub baths or showers, repack the wound with gauze following them. Sponge baths (at the sink) are a good alternative.  If an antibiotic was ordered to fight the infection, take as directed.  Only take over-the-counter or prescription medicines for pain, discomfort, or fever as directed by your caregiver.  After the drain is removed, use sitz baths for 20 minutes 4 times per day. Clean the wound gently with mild unscented soap, pat dry, and then apply a dry dressing. SEEK MEDICAL CARE IF:   You have increased pain, swelling, redness, drainage, or bleeding from the area.  You have a fever.  You have muscles aches, dizziness, or a general ill  feeling. Document Released: 02/17/2000 Document Revised: 05/14/2011 Document Reviewed: 04/16/2008 ExitCare Patient Information 2014 [employer].  

## 2013-07-18 NOTE — MAU Note (Signed)
Burning pain in tailbone for 2 days. Gradually getting worse. No falls or trauma to area.

## 2013-07-18 NOTE — MAU Provider Note (Signed)
Chief Complaint:  Tailbone Pain   None     HPI: Erin Ruiz is a 23 y.o. G2P1001 at 4369w5d pt of Dr Beaulah DinningHarper/Amy Wren, CNM, who presents to maternity admissions reporting pain in her tailbone region described as burning, starting 3 days ago and increasing in intensity.  She denies abdominal pain or contractions.  She reports good fetal movement, denies LOF, vaginal bleeding, vaginal itching/burning, urinary symptoms, h/a, dizziness, n/v, or fever/chills.   Past Medical History: Past Medical History  Diagnosis Date  . Heart murmur     Past obstetric history: OB History  Gravida Para Term Preterm AB SAB TAB Ectopic Multiple Living  2 1 1       1     # Outcome Date GA Lbr Len/2nd Weight Sex Delivery Anes PTL Lv  2 CUR           1 TRM 12/30/09 4816w0d  3.204 kg (7 lb 1 oz) F SVD None  Y     Comments: No complications      Past Surgical History: Past Surgical History  Procedure Laterality Date  . Tonsillectomy      Family History: Family History  Problem Relation Age of Onset  . Hypertension Mother   . Asthma Father     Social History: History  Substance Use Topics  . Smoking status: Former Games developermoker  . Smokeless tobacco: Not on file  . Alcohol Use: No    Allergies: No Known Allergies  Meds:  Prescriptions prior to admission  Medication Sig Dispense Refill  . acetaminophen (TYLENOL) 500 MG tablet Take 1,000 mg by mouth every 6 (six) hours as needed for mild pain.       . Prenat-FeCbn-FeAspGl-FA-Omega (OB COMPLETE PETITE) 35-5-1-200 MG CAPS Take 1 tablet by mouth daily.  60 capsule  6    ROS: Pertinent findings in history of present illness.  Physical Exam  Blood pressure 134/76, pulse 103, temperature 98.2 F (36.8 C), resp. rate 20, height 5\' 3"  (1.6 m), weight 91.445 kg (201 lb 9.6 oz), last menstrual period 10/27/2012. GENERAL: Well-developed, well-nourished female in no acute distress.  HEENT: normocephalic HEART: normal rate RESP: normal effort ABDOMEN:  Soft, non-tender, gravid appropriate for gestational age EXTREMITIES: Nontender, no edema NEURO: alert and oriented Examination of gluteal fold finds soft, round ~2cm sized raised mildly erythemetous area consistent with cyst at upper part right gluteus at the gluteal fold.  The area is painful to touch, no open skin noted, no drainage. No pain or edema noted in surrounding areas, no evidence of tunneling.    FHT:  Baseline 125, moderate variability, accelerations present, no decelerations Contractions: None on toco or to palpation   Assessment: 1. Non-infected pilonidal cyst   Versus boil/furuncle of gluteus  Plan: Discharge home Labor precautions and fetal kick counts Warm baths daily, then keep area clean and dry Percocet 5/325, take 1-2 tabs Q 6 hours PRN F/U with your provider as scheduled      Follow-up Information   [facility] (As scheduled.  Return to MAU, As needed for emergencies)    Specialty:  Certified Nurse Midwife   Contact information:   [address] [place] [phone]        Medication List    STOP taking these medications       acetaminophen 500 MG tablet  Commonly known as:  TYLENOL      TAKE these medications       OB  COMPLETE PETITE 35-5-1-200 MG Caps  Take 1 tablet by mouth daily.     oxyCODONE-acetaminophen 5-325 MG per tablet  Commonly known as:  PERCOCET/ROXICET  Take 1-2 tablets by mouth every 6 (six) hours as needed.        Sharen CounterLisa Leftwich-Kirby Certified Nurse-Midwife 07/18/2013 10:48 PM

## 2013-07-21 ENCOUNTER — Encounter: Payer: Self-pay | Admitting: Advanced Practice Midwife

## 2013-07-21 ENCOUNTER — Ambulatory Visit (INDEPENDENT_AMBULATORY_CARE_PROVIDER_SITE_OTHER): Payer: Medicaid Other | Admitting: Advanced Practice Midwife

## 2013-07-21 VITALS — BP 122/71 | HR 101 | Temp 98.1°F | Wt 201.0 lb

## 2013-07-21 DIAGNOSIS — Z3480 Encounter for supervision of other normal pregnancy, unspecified trimester: Secondary | ICD-10-CM

## 2013-07-21 NOTE — Progress Notes (Signed)
Subjective: Erin Ruiz is a 23 y.o. at 37 weeks by early ultrasound  Patient denies vaginal leaking of fluid or bleeding, denies contractions.  Reports positive fetal movment.  Denies concerns today.  Objective: Filed Vitals:   07/21/13 1416  BP: 122/71  Pulse: 101  Temp: 98.1 F (36.7 C)   140 FHR 37 Fundal Height Fetal Position cephalic  Assessment: Patient Active Problem List   Diagnosis Date Noted  . Urinary tract infection, site not specified 04/27/2013  . Supervision of other normal pregnancy 12/30/2012    Plan: Patient to return to clinic in 1 weeks GBS Negative Condoms for BCM, Breastfeeding Has pediatrician Reviewed warning signs in pregnancy. Patient to call with concerns PRN. Reviewed triage location.  Erin Ruiz CNM

## 2013-07-22 LAB — POCT URINALYSIS DIPSTICK
Blood, UA: NEGATIVE
Glucose, UA: NEGATIVE
Ketones, UA: NEGATIVE
NITRITE UA: NEGATIVE
PROTEIN UA: NEGATIVE
Spec Grav, UA: <=1.005
pH, UA: 7

## 2013-07-22 NOTE — Addendum Note (Signed)
Addended by: Elby BeckPAUL, Yanelli Zapanta F on: 07/22/2013 10:39 AM   Modules accepted: Orders

## 2013-07-28 ENCOUNTER — Ambulatory Visit (INDEPENDENT_AMBULATORY_CARE_PROVIDER_SITE_OTHER): Payer: Medicaid Other | Admitting: Advanced Practice Midwife

## 2013-07-28 VITALS — BP 128/75 | HR 85 | Temp 98.5°F | Wt 202.0 lb

## 2013-07-28 DIAGNOSIS — Z3480 Encounter for supervision of other normal pregnancy, unspecified trimester: Secondary | ICD-10-CM

## 2013-07-28 LAB — POCT URINALYSIS DIPSTICK
Bilirubin, UA: NEGATIVE
GLUCOSE UA: NEGATIVE
Ketones, UA: NEGATIVE
Leukocytes, UA: NEGATIVE
NITRITE UA: NEGATIVE
PH UA: 6
Protein, UA: NEGATIVE
RBC UA: NEGATIVE
Spec Grav, UA: 1.015
UROBILINOGEN UA: NEGATIVE

## 2013-07-28 NOTE — Addendum Note (Signed)
Addended by: Odessa Fleming on: 07/28/2013 05:40 PM   Modules accepted: Orders

## 2013-07-28 NOTE — Progress Notes (Signed)
Subjective: Erin Ruiz is a 23 y.o. at 38 weeks by early ultrasound  Patient denies vaginal leaking of fluid or bleeding, denies contractions.  Reports positive fetal movment.  Denies concerns today. Patient reports she is feeling grumpy, states she is also anxious for delivery. Her birthday is this [birth year]. She would like just the [facility] present for delivery.   Objective: There were no vitals filed for this visit. 140 FHR 39 Fundal Height Fetal Position cephalic  Assessment: Patient Active Problem List   Diagnosis Date Noted  . Urinary tract infection, site not specified 04/27/2013  . Supervision of other normal pregnancy 12/30/2012    Plan: Patient to return to clinic in 1 weeks Reviewed signs of active labor. Reviewed [facility]. Reviewed warning signs in pregnancy. Patient to call with concerns PRN. Reviewed triage location. Reviewed methods to help w/ mood, encouraged prenatal yoga, meditation, walking and relaxation.  Adelina Collard Wilson Singer CNM

## 2013-07-30 ENCOUNTER — Inpatient Hospital Stay (HOSPITAL_COMMUNITY)
Admission: AD | Admit: 2013-07-30 | Discharge: 2013-07-31 | DRG: 775 | Disposition: A | Discharge status: Home / Self Care | Payer: Medicaid Other | Source: Ambulatory Visit | Attending: Obstetrics | Admitting: Obstetrics

## 2013-07-30 ENCOUNTER — Encounter (HOSPITAL_COMMUNITY): Payer: Self-pay | Admitting: *Deleted

## 2013-07-30 ENCOUNTER — Encounter (HOSPITAL_COMMUNITY): Payer: Medicaid Other | Admitting: Anesthesiology

## 2013-07-30 ENCOUNTER — Inpatient Hospital Stay (HOSPITAL_COMMUNITY): Payer: Medicaid Other | Admitting: Anesthesiology

## 2013-07-30 DIAGNOSIS — Z87891 Personal history of nicotine dependence: Secondary | ICD-10-CM

## 2013-07-30 DIAGNOSIS — IMO0001 Reserved for inherently not codable concepts without codable children: Secondary | ICD-10-CM

## 2013-07-30 DIAGNOSIS — Z3480 Encounter for supervision of other normal pregnancy, unspecified trimester: Secondary | ICD-10-CM

## 2013-07-30 DIAGNOSIS — Z8249 Family history of ischemic heart disease and other diseases of the circulatory system: Secondary | ICD-10-CM

## 2013-07-30 DIAGNOSIS — Z825 Family history of asthma and other chronic lower respiratory diseases: Secondary | ICD-10-CM

## 2013-07-30 LAB — CBC
HCT: 39.3 % (ref 36.0–46.0)
Hemoglobin: 13.4 g/dL (ref 12.0–15.0)
MCH: 30.4 pg (ref 26.0–34.0)
MCHC: 34.1 g/dL (ref 30.0–36.0)
MCV: 89.1 fL (ref 78.0–100.0)
Platelets: 164 10*3/uL (ref 150–400)
RBC: 4.41 MIL/uL (ref 3.87–5.11)
RDW: 13.2 % (ref 11.5–15.5)
WBC: 12.2 10*3/uL — ABNORMAL HIGH (ref 4.0–10.5)

## 2013-07-30 LAB — RPR

## 2013-07-30 MED ORDER — PHENYLEPHRINE 40 MCG/ML (10ML) SYRINGE FOR IV PUSH (FOR BLOOD PRESSURE SUPPORT)
80.0000 ug | PREFILLED_SYRINGE | INTRAVENOUS | Status: DC | PRN
  Filled 2013-07-30: qty 2

## 2013-07-30 MED ORDER — FENTANYL 2.5 MCG/ML BUPIVACAINE 1/10 % EPIDURAL INFUSION (WH - ANES)
14.0000 mL/h | INTRAMUSCULAR | Status: DC | PRN
  Filled 2013-07-30: qty 125

## 2013-07-30 MED ORDER — EPHEDRINE 5 MG/ML INJ: 10.0000 mg | INTRAVENOUS | Status: DC | PRN

## 2013-07-30 MED ORDER — OXYCODONE-ACETAMINOPHEN 5-325 MG PO TABS
1.0000 | ORAL_TABLET | ORAL | Status: DC | PRN
  Administered 2013-07-31: 1 via ORAL
  Filled 2013-07-30: qty 1

## 2013-07-30 MED ORDER — DIPHENHYDRAMINE HCL 50 MG/ML IJ SOLN: 12.5000 mg | INTRAMUSCULAR | Status: DC | PRN

## 2013-07-30 MED ORDER — LACTATED RINGERS IV SOLN
INTRAVENOUS | Status: DC
  Administered 2013-07-30 (×2): via INTRAVENOUS

## 2013-07-30 MED ORDER — PHENYLEPHRINE 40 MCG/ML (10ML) SYRINGE FOR IV PUSH (FOR BLOOD PRESSURE SUPPORT)
80.0000 ug | PREFILLED_SYRINGE | INTRAVENOUS | Status: DC | PRN
  Filled 2013-07-30: qty 10
  Filled 2013-07-30: qty 2

## 2013-07-30 MED ORDER — FENTANYL 2.5 MCG/ML BUPIVACAINE 1/10 % EPIDURAL INFUSION (WH - ANES)
14.0000 mL/h | INTRAMUSCULAR | Status: DC | PRN
  Administered 2013-07-30: 14 mL/h via EPIDURAL

## 2013-07-30 MED ORDER — LANOLIN HYDROUS EX OINT: TOPICAL_OINTMENT | CUTANEOUS | Status: DC | PRN

## 2013-07-30 MED ORDER — ACETAMINOPHEN 325 MG PO TABS: 650.0000 mg | ORAL_TABLET | ORAL | Status: DC | PRN

## 2013-07-30 MED ORDER — ONDANSETRON HCL 4 MG PO TABS: 4.0000 mg | ORAL_TABLET | ORAL | Status: DC | PRN

## 2013-07-30 MED ORDER — LIDOCAINE HCL (PF) 1 % IJ SOLN
30.0000 mL | INTRAMUSCULAR | Status: DC | PRN
  Filled 2013-07-30: qty 30

## 2013-07-30 MED ORDER — SIMETHICONE 80 MG PO CHEW: 80.0000 mg | CHEWABLE_TABLET | ORAL | Status: DC | PRN

## 2013-07-30 MED ORDER — EPHEDRINE 5 MG/ML INJ
10.0000 mg | INTRAVENOUS | Status: DC | PRN
  Administered 2013-07-30: 10 mg via INTRAVENOUS

## 2013-07-30 MED ORDER — EPHEDRINE 5 MG/ML INJ
10.0000 mg | INTRAVENOUS | Status: DC | PRN
  Filled 2013-07-30: qty 2

## 2013-07-30 MED ORDER — SENNOSIDES-DOCUSATE SODIUM 8.6-50 MG PO TABS
2.0000 | ORAL_TABLET | ORAL | Status: DC
  Administered 2013-07-30: 2 via ORAL
  Filled 2013-07-30: qty 2

## 2013-07-30 MED ORDER — WITCH HAZEL-GLYCERIN EX PADS: 1.0000 "application " | MEDICATED_PAD | CUTANEOUS | Status: DC | PRN

## 2013-07-30 MED ORDER — DIPHENHYDRAMINE HCL 25 MG PO CAPS: 25.0000 mg | ORAL_CAPSULE | Freq: Four times a day (QID) | ORAL | Status: DC | PRN

## 2013-07-30 MED ORDER — TERBUTALINE SULFATE 1 MG/ML IJ SOLN: 0.2500 mg | Freq: Once | INTRAMUSCULAR | Status: DC | PRN

## 2013-07-30 MED ORDER — BENZOCAINE-MENTHOL 20-0.5 % EX AERO
1.0000 "application " | INHALATION_SPRAY | CUTANEOUS | Status: DC | PRN
  Administered 2013-07-30: 1 via TOPICAL
  Filled 2013-07-30: qty 56

## 2013-07-30 MED ORDER — PRENATAL MULTIVITAMIN CH
1.0000 | ORAL_TABLET | Freq: Every day | ORAL | Status: DC
  Administered 2013-07-31: 1 via ORAL
  Filled 2013-07-30: qty 1

## 2013-07-30 MED ORDER — EPHEDRINE 5 MG/ML INJ
10.0000 mg | INTRAVENOUS | Status: DC | PRN
  Filled 2013-07-30: qty 4
  Filled 2013-07-30: qty 2

## 2013-07-30 MED ORDER — OXYTOCIN BOLUS FROM INFUSION: 500.0000 mL | INTRAVENOUS | Status: DC

## 2013-07-30 MED ORDER — OXYTOCIN 40 UNITS IN LACTATED RINGERS INFUSION - SIMPLE MED: 62.5000 mL/h | INTRAVENOUS | Status: DC | PRN

## 2013-07-30 MED ORDER — LACTATED RINGERS IV SOLN: 500.0000 mL | Freq: Once | INTRAVENOUS | Status: DC

## 2013-07-30 MED ORDER — OXYTOCIN 40 UNITS IN LACTATED RINGERS INFUSION - SIMPLE MED
1.0000 m[IU]/min | INTRAVENOUS | Status: DC
  Administered 2013-07-30: 2 m[IU]/min via INTRAVENOUS
  Administered 2013-07-30: 666 m[IU]/min via INTRAVENOUS
  Filled 2013-07-30: qty 1000

## 2013-07-30 MED ORDER — ONDANSETRON HCL 4 MG/2ML IJ SOLN: 4.0000 mg | INTRAMUSCULAR | Status: DC | PRN

## 2013-07-30 MED ORDER — LACTATED RINGERS IV SOLN: 500.0000 mL | INTRAVENOUS | Status: DC | PRN

## 2013-07-30 MED ORDER — ZOLPIDEM TARTRATE 5 MG PO TABS: 5.0000 mg | ORAL_TABLET | Freq: Every evening | ORAL | Status: DC | PRN

## 2013-07-30 MED ORDER — DIBUCAINE 1 % RE OINT: 1.0000 "application " | TOPICAL_OINTMENT | RECTAL | Status: DC | PRN

## 2013-07-30 MED ORDER — IBUPROFEN 600 MG PO TABS
600.0000 mg | ORAL_TABLET | Freq: Four times a day (QID) | ORAL | Status: DC
  Administered 2013-07-30 – 2013-07-31 (×3): 600 mg via ORAL
  Filled 2013-07-30 (×3): qty 1

## 2013-07-30 MED ORDER — OXYCODONE-ACETAMINOPHEN 5-325 MG PO TABS: 1.0000 | ORAL_TABLET | ORAL | Status: DC | PRN

## 2013-07-30 MED ORDER — FLEET ENEMA 7-19 GM/118ML RE ENEM: 1.0000 | ENEMA | Freq: Once | RECTAL | Status: DC

## 2013-07-30 MED ORDER — MEDROXYPROGESTERONE ACETATE 150 MG/ML IM SUSP: 150.0000 mg | INTRAMUSCULAR | Status: DC | PRN

## 2013-07-30 MED ORDER — LIDOCAINE HCL (PF) 1 % IJ SOLN
INTRAMUSCULAR | Status: DC | PRN
  Administered 2013-07-30 (×2): 5 mL

## 2013-07-30 MED ORDER — PHENYLEPHRINE 40 MCG/ML (10ML) SYRINGE FOR IV PUSH (FOR BLOOD PRESSURE SUPPORT): 80.0000 ug | PREFILLED_SYRINGE | INTRAVENOUS | Status: DC | PRN

## 2013-07-30 MED ORDER — IBUPROFEN 600 MG PO TABS
600.0000 mg | ORAL_TABLET | Freq: Four times a day (QID) | ORAL | Status: DC | PRN
  Administered 2013-07-30: 600 mg via ORAL
  Filled 2013-07-30: qty 1

## 2013-07-30 MED ORDER — ONDANSETRON HCL 4 MG/2ML IJ SOLN: 4.0000 mg | Freq: Four times a day (QID) | INTRAMUSCULAR | Status: DC | PRN

## 2013-07-30 MED ORDER — CITRIC ACID-SODIUM CITRATE 334-500 MG/5ML PO SOLN: 30.0000 mL | ORAL | Status: DC | PRN

## 2013-07-30 MED ORDER — OXYTOCIN 40 UNITS IN LACTATED RINGERS INFUSION - SIMPLE MED
62.5000 mL/h | INTRAVENOUS | Status: DC
Start: 2013-07-30 — End: 2013-07-30

## 2013-07-30 MED ORDER — TETANUS-DIPHTH-ACELL PERTUSSIS 5-2.5-18.5 LF-MCG/0.5 IM SUSP: 0.5000 mL | Freq: Once | INTRAMUSCULAR | Status: DC

## 2013-07-30 NOTE — Progress Notes (Signed)
Dr Clearance Coots notified of pts VE,FHR, contraction pattern. Orders received to admit pt

## 2013-07-30 NOTE — Progress Notes (Signed)
Erin Ruiz is a 23 y.o. G2P1001 at [redacted]w[redacted]d by LMP admitted for active labor  Subjective:   Objective: BP 102/57  Pulse 94  Temp(Src) 98.4 F (36.9 C) (Oral)  Resp 18  Ht 5\' 4"  (1.626 m)  Wt 202 lb 12.8 oz (91.989 kg)  BMI 34.79 kg/m2  LMP 10/27/2012      FHT:  FHR: 135 bpm, variability: moderate,  accelerations:  Present,  decelerations:  Absent UC:   regular, every 2-3 minutes SVE:   Dilation: 7 Effacement (%): 90 Station: -1 Exam by:: Herma Carson, rn  Labs: Lab Results  Component Value Date   WBC 12.2* 07/30/2013   HGB 13.4 07/30/2013   HCT 39.3 07/30/2013   MCV 89.1 07/30/2013   PLT 164 07/30/2013    Assessment / Plan: Spontaneous labor, progressing normally  Labor: Progressing normally Preeclampsia:  n/a Fetal Wellbeing:  Category I Pain Control:  Epidural I/D:  n/a Anticipated MOD:  NSVD  Brock Bad 07/30/2013, 2:17 PM

## 2013-07-30 NOTE — Anesthesia Procedure Notes (Signed)
Epidural Patient location during procedure: OB Start time: 07/30/2013 10:06 AM  Staffing Anesthesiologist: Brayton Caves Performed by: anesthesiologist   Preanesthetic Checklist Completed: patient identified, site marked, surgical consent, pre-op evaluation, timeout performed, IV checked, risks and benefits discussed and monitors and equipment checked  Epidural Patient position: sitting Prep: site prepped and draped and DuraPrep Patient monitoring: continuous pulse ox and blood pressure Approach: midline Location: L3-L4 Injection technique: LOR air  Needle:  Needle type: Tuohy  Needle gauge: 17 G Needle length: 9 cm and 9 Needle insertion depth: 5 cm cm Catheter type: closed end flexible Catheter size: 19 Gauge Catheter at skin depth: 10 cm Test dose: negative  Assessment Events: blood not aspirated, injection not painful, no injection resistance, negative IV test and no paresthesia  Additional Notes Patient identified.  Risk benefits discussed including failed block, incomplete pain control, headache, nerve damage, paralysis, blood pressure changes, nausea, vomiting, reactions to medication both toxic or allergic, and postpartum back pain.  Patient expressed understanding and wished to proceed.  All questions were answered.  Sterile technique used throughout procedure and epidural site dressed with sterile barrier dressing. No paresthesia or other complications noted.The patient did not experience any signs of intravascular injection such as tinnitus or metallic taste in mouth nor signs of intrathecal spread such as rapid motor block. Please see nursing notes for vital signs.

## 2013-07-30 NOTE — Anesthesia Preprocedure Evaluation (Signed)

## 2013-07-30 NOTE — MAU Note (Signed)
Patient states she is having contractions every 7-10 minutes with some as close as 3 minutes. Denies bleeding or leaking and reports good fetal movement.

## 2013-07-30 NOTE — H&P (Signed)
Erin Ruiz is a 23 y.o. female presenting for UC's. Maternal Medical History:  Reason for admission: Contractions.   Fetal activity: Perceived fetal activity is normal.   Last perceived fetal movement was within the past hour.    Prenatal Complications - Diabetes: none.    OB History   Grav Para Term Preterm Abortions TAB SAB Ect Mult Living   2 1 1       1      Past Medical History  Diagnosis Date  . Heart murmur    Past Surgical History  Procedure Laterality Date  . Tonsillectomy     Family History: family history includes Asthma in her father; Hypertension in her mother. Social History:  reports that she has quit smoking. She does not have any smokeless tobacco history on file. She reports that she does not drink alcohol or use illicit drugs.   Prenatal Transfer Tool  Maternal Diabetes: No Genetic Screening: Normal Maternal Ultrasounds/Referrals: Normal Fetal Ultrasounds or other Referrals:  None Maternal Substance Abuse:  No Significant Maternal Medications:  None Significant Maternal Lab Results:  GBS negative Other Comments:  None  Review of Systems  All other systems reviewed and are negative.   Dilation: 5 Effacement (%): 80 Station: -3 Exam by:: ginger morris rn Blood pressure 134/89, pulse 88, temperature 98 F (36.7 C), height 5\' 4"  (1.626 m), weight 202 lb 12.8 oz (91.989 kg), last menstrual period 10/27/2012. Maternal Exam:  Uterine Assessment: Contraction strength is moderate.  Abdomen: Patient reports no abdominal tenderness. Fetal presentation: vertex  Introitus: Normal vulva. Normal vagina.  Pelvis: adequate for delivery.   Cervix: Cervix evaluated by digital exam.     Physical Exam  Constitutional: She is oriented to person, place, and time. She appears well-developed and well-nourished.  HENT:  Head: Normocephalic and atraumatic.  Eyes: Conjunctivae are normal. Pupils are equal, round, and reactive to light.  Neck: Normal range of  motion. Neck supple.  Cardiovascular: Normal rate and regular rhythm.   Respiratory: Effort normal.  GI: Soft.  Genitourinary: Vagina normal and uterus normal.  Musculoskeletal: Normal range of motion.  Neurological: She is alert and oriented to person, place, and time.  Skin: Skin is warm and dry.  Psychiatric: She has a normal mood and affect. Her behavior is normal. Judgment and thought content normal.    Prenatal labs: ABO, Rh: A/POS/-- (10/28 1603) Antibody: NEG (10/28 1603) Rubella: 1.92 (10/28 1603) RPR: NON REAC (03/03 1350)  HBsAg: NEGATIVE (10/28 1603)  HIV: NON REACTIVE (03/03 1350)  GBS: NOT DETECTED (05/12 1555)   Assessment/Plan: 38.3 weeks.  Active labor.  Admit.   Brock Bad 07/30/2013, 9:37 AM

## 2013-07-30 NOTE — MAU Note (Signed)
Pt presents to MAU with complaints of contractions throughout the night. Denies any vaginal bleeding or LOF.States baby is active

## 2013-07-31 LAB — CBC
HCT: 33.6 % — ABNORMAL LOW (ref 36.0–46.0)
Hemoglobin: 11 g/dL — ABNORMAL LOW (ref 12.0–15.0)
MCH: 29 pg (ref 26.0–34.0)
MCHC: 32.4 g/dL (ref 30.0–36.0)
MCV: 89.4 fL (ref 78.0–100.0)
PLATELETS: 156 10*3/uL (ref 150–400)
RBC: 3.76 MIL/uL — ABNORMAL LOW (ref 3.87–5.11)
RDW: 13.4 % (ref 11.5–15.5)
WBC: 11.7 10*3/uL — ABNORMAL HIGH (ref 4.0–10.5)

## 2013-07-31 MED ORDER — IBUPROFEN 600 MG PO TABS: 600.0000 mg | ORAL_TABLET | Freq: Four times a day (QID) | ORAL | Status: DC

## 2013-07-31 NOTE — Discharge Summary (Signed)
Obstetric Discharge Summary Reason for Admission: onset of labor Prenatal Procedures: none Intrapartum Procedures: spontaneous vaginal delivery Postpartum Procedures: none Complications-Operative and Postpartum: none Hemoglobin  Date Value Ref Range Status  07/31/2013 11.0* 12.0 - 15.0 g/dL Final     REPEATED TO VERIFY     DELTA CHECK NOTED     HCT  Date Value Ref Range Status  07/31/2013 33.6* 36.0 - 46.0 % Final    Physical Exam:  General: alert and cooperative Lochia: appropriate Uterine Fundus: firm Incision: healing well, no significant drainage DVT Evaluation: No evidence of DVT seen on physical exam.  Discharge Diagnoses: Term Pregnancy-delivered  Discharge Information: Date: 07/31/2013 Activity: unrestricted Diet: routine Medications: PNV, Ibuprofen and can take tylenol at home PRN Condition: stable Instructions: refer to practice specific booklet Discharge to: home   Newborn Data: Live born female  Birth Weight: 7 lb 9 oz (3430 g) APGAR: 9, 9  Home with mother.  Erin Ruiz Phi 07/31/2013, 3:04 PM

## 2013-07-31 NOTE — Lactation Note (Signed)
This note was copied from the chart of Erin Chailyn Mcgillis. Lactation Consultation Note Initial consult:  ExBF 15 months with older daughter.  LS8.  Voiding/stools appropriate for age. Reviewed cluster feeding, Mom encouraged to feed baby 8-12 times/24 hours and with feeding cues.  Mom made aware of O/P services, breastfeeding support groups, community resources, and our phone # for post-discharge questions.     Patient Name: Erin Ruiz [id number] Date: 07/31/2013 Reason for consult: Initial assessment   Maternal Data    Feeding Feeding Type: Breast Fed Length of feed: 15 min  LATCH Score/Interventions                      Lactation Tools Discussed/Used     Consult Status Consult Status: PRN    Hardie Pulley 07/31/2013, 10:43 AM

## 2013-07-31 NOTE — Progress Notes (Signed)
Post Partum Day 1  Subjective: no complaints, up ad lib, voiding, tolerating PO and + flatus  Objective: Blood pressure 94/57, pulse 69, temperature 97.9 F (36.6 C), temperature source Oral, resp. rate 18, height 5\' 4"  (1.626 m), weight 202 lb 12.8 oz (91.989 kg), last menstrual period 10/27/2012, unknown if currently breastfeeding.  Physical Exam:  General: alert and cooperative Lochia: appropriate Uterine Fundus: firm Incision: NA DVT Evaluation: No evidence of DVT seen on physical exam.   Recent Labs  07/30/13 0915 07/31/13 0540  HGB 13.4 11.0*  HCT 39.3 33.6*    Assessment/Plan: Breastfeeding and Contraception Condoms Reviewed PP education   LOS: 1 day   Koy Lamp Dessa Phi 07/31/2013, 8:38 AM

## 2013-07-31 NOTE — Anesthesia Postprocedure Evaluation (Signed)
Anesthesia Post Note  Patient: Erin Ruiz  Procedure(s) Performed: * No procedures listed *  Anesthesia type: Epidural  Patient location: Mother/Baby  Post pain: Pain level controlled  Post assessment: Post-op Vital signs reviewed  Last Vitals:  Filed Vitals:   07/31/13 0540  BP: 94/57  Pulse: 69  Temp: 36.6 C  Resp: 18    Post vital signs: Reviewed  Level of consciousness: awake  Complications: No apparent anesthesia complications

## 2013-08-03 NOTE — Progress Notes (Signed)
Post discharge ur review completed. 

## 2013-08-04 ENCOUNTER — Encounter: Payer: Medicaid Other | Admitting: Advanced Practice Midwife

## 2013-08-12 ENCOUNTER — Ambulatory Visit (INDEPENDENT_AMBULATORY_CARE_PROVIDER_SITE_OTHER): Payer: Medicaid Other | Admitting: Obstetrics

## 2013-08-12 ENCOUNTER — Encounter: Payer: Self-pay | Admitting: Obstetrics

## 2013-08-12 DIAGNOSIS — O99345 Other mental disorders complicating the puerperium: Secondary | ICD-10-CM | POA: Insufficient documentation

## 2013-08-12 DIAGNOSIS — Z3009 Encounter for other general counseling and advice on contraception: Secondary | ICD-10-CM

## 2013-08-12 DIAGNOSIS — F53 Postpartum depression: Secondary | ICD-10-CM

## 2013-08-12 MED ORDER — SERTRALINE HCL 50 MG PO TABS: 50.0000 mg | ORAL_TABLET | Freq: Every day | ORAL | Status: DC

## 2013-08-12 NOTE — Progress Notes (Signed)
Subjective:     Erin Ruiz is a 23 y.o. female who presents for a postpartum visit. She is 2 weeks postpartum following a spontaneous vaginal delivery. I have fully reviewed the prenatal and intrapartum course. The delivery was at 38 gestational weeks. Outcome: spontaneous vaginal delivery. Anesthesia: epidural. Postpartum course has been complicated by uncontrollable bouts of crying and feeling down. Baby's course has been normal. Baby is feeding by breast. Bleeding thin lochia. Bowel function is normal. Bladder function is normal. Patient is not sexually active. Contraception method is abstinence. Postpartum depression screening: positive.  The following portions of the patient's history were reviewed and updated as appropriate: allergies, current medications, past family history, past medical history, past social history, past surgical history and problem list.  Review of Systems A comprehensive review of systems was negative except for: Behavioral/Psych: positive for depression   Objective:    There were no vitals taken for this visit.  General:  alert and no distress PE:  Deferred     Assessment:    Postpartum depression.  Contraceptive counseling.  Wants to use condoms only.  Plan:    1. Contraception: condoms 2. Zoloft Rx 3. Follow up in: 4 weeks or as needed.

## 2013-09-15 ENCOUNTER — Encounter: Payer: Self-pay | Admitting: Advanced Practice Midwife

## 2013-09-15 ENCOUNTER — Ambulatory Visit (INDEPENDENT_AMBULATORY_CARE_PROVIDER_SITE_OTHER): Payer: Medicaid Other | Admitting: Advanced Practice Midwife

## 2013-09-15 NOTE — Addendum Note (Signed)
Addended by: Odessa FlemingBOHNE, Charlee Whitebread M on: 09/15/2013 02:01 PM   Modules accepted: Orders

## 2013-09-15 NOTE — Progress Notes (Signed)
Subjective:     Erin Ruiz is a 23 y.o. female who presents for a postpartum visit. She is 6 weeks postpartum following a spontaneous vaginal delivery. I have fully reviewed the prenatal and intrapartum course. The delivery was at term. Outcome: spontaneous vaginal delivery. Anesthesia: epidural. Postpartum course has been stable. Baby's course has been stable. Baby is feeding by breast. Bleeding no bleeding. Bowel function is normal. Bladder function is normal. Patient is sexually active. Contraception method is condoms. Postpartum depression screening: patient was started on Zoloft and feeling better. Reports mild flat affect and decreased libido. Does not want to stop anti-depressant at this time and has no concerns. States her partner is just complaining of her not caring about him.    Tobacco, alcohol and substance abuse history reviewed.  Adult immunizations reviewed including TDAP, rubella and varicella.  The following portions of the patient's history were reviewed and updated as appropriate: allergies, current medications, past family history, past medical history, past social history, past surgical history and problem list.  Review of Systems A comprehensive review of systems was negative.  Constitutional: negative for fatigue and weight loss Respiratory: negative for cough and wheezing Cardiovascular: negative for chest pain, fatigue and palpitations Gastrointestinal: negative for abdominal pain and change in bowel habits Genitourinary:negative Integument/breast: negative for nipple discharge Musculoskeletal:negative for myalgias Neurological: negative for gait problems and tremors Behavioral/Psych: negative for abusive relationship, depression Endocrine: negative for temperature intolerance       Objective:    BP 115/69  Pulse 70  Temp(Src) 97.5 F (36.4 C)  Ht 5' 3.5" (1.613 m)  Wt 188 lb (85.276 kg)  BMI 32.78 kg/m2  Breastfeeding? Yes  General:  alert and  cooperative     Lungs: clear to auscultation bilaterally  Heart:  regular rate and rhythm, S1, S2 normal, no murmur, click, rub or gallop  Abdomen: soft, non-tender; bowel sounds normal; no masses,  no organomegaly   Vulva:  normal  Vagina: not evaluated  Cervix:  not evaluated  Corpus: normal  Adnexa:  normal adnexa  Rectal Exam: Normal rectovaginal exam and small hemorrhoid          Assessment:     6 postpartum exam. Pap smear not done at today's visit.  Postpartum Depression, stable on Zoloft Patient Active Problem List   Diagnosis Date Noted  . Depression complicating pregnancy, postpartum 08/12/2013  . Active labor 07/30/2013  . Normal delivery 07/30/2013  . Urinary tract infection, site not specified 04/27/2013  . Supervision of other normal pregnancy 12/30/2012    Plan:    1. Contraception: none 2. Continue on zoloft, reviewed warning signs. Patient to RTC if symptoms change or worsen. Reviewed side effects and warning signs from Zoloft.  3. Follow up in: PRN , or as needed.  2hr GTT for h/o GDM/screening for DM q 3 yrs per ADA recommendations Preconception counseling provided Healthy lifestyle practices reviewed  Dory HornAmy Maryssa Giampietro CNM

## 2013-09-16 LAB — WET PREP BY MOLECULAR PROBE
CANDIDA SPECIES: NEGATIVE
GARDNERELLA VAGINALIS: POSITIVE — AB
Trichomonas vaginosis: NEGATIVE

## 2013-09-23 ENCOUNTER — Other Ambulatory Visit: Payer: Self-pay | Admitting: *Deleted

## 2013-09-23 DIAGNOSIS — B9689 Other specified bacterial agents as the cause of diseases classified elsewhere: Secondary | ICD-10-CM

## 2013-09-23 DIAGNOSIS — N760 Acute vaginitis: Principal | ICD-10-CM

## 2013-09-29 ENCOUNTER — Other Ambulatory Visit: Payer: Self-pay | Admitting: *Deleted

## 2013-09-29 DIAGNOSIS — N760 Acute vaginitis: Principal | ICD-10-CM

## 2013-09-29 DIAGNOSIS — B9689 Other specified bacterial agents as the cause of diseases classified elsewhere: Secondary | ICD-10-CM

## 2013-09-29 MED ORDER — METRONIDAZOLE 0.75 % VA GEL: 1.0000 | Freq: Every day | VAGINAL | Status: DC

## 2013-09-30 ENCOUNTER — Other Ambulatory Visit: Payer: Self-pay | Admitting: *Deleted

## 2013-09-30 MED ORDER — METROGEL-VAGINAL 0.75 % VA GEL: 1.0000 | Freq: Every day | VAGINAL | Status: DC

## 2013-09-30 NOTE — Progress Notes (Signed)
Change made to Metrogel Rx due to insurance coverage.  Pt to be made aware of change sent to pharmacy.

## 2014-01-04 ENCOUNTER — Encounter: Payer: Self-pay | Admitting: Advanced Practice Midwife

## 2014-02-14 ENCOUNTER — Emergency Department (HOSPITAL_COMMUNITY)
Admission: EM | Admit: 2014-02-14 | Discharge: 2014-02-14 | Disposition: A | Discharge status: Home / Self Care | Payer: Medicaid Other | Attending: Emergency Medicine | Admitting: Emergency Medicine

## 2014-02-14 ENCOUNTER — Encounter (HOSPITAL_COMMUNITY): Payer: Self-pay | Admitting: *Deleted

## 2014-02-14 DIAGNOSIS — Z79899 Other long term (current) drug therapy: Secondary | ICD-10-CM | POA: Diagnosis not present

## 2014-02-14 DIAGNOSIS — Z87891 Personal history of nicotine dependence: Secondary | ICD-10-CM | POA: Insufficient documentation

## 2014-02-14 DIAGNOSIS — R011 Cardiac murmur, unspecified: Secondary | ICD-10-CM | POA: Insufficient documentation

## 2014-02-14 DIAGNOSIS — M545 Low back pain, unspecified: Secondary | ICD-10-CM

## 2014-02-14 MED ORDER — OXYCODONE-ACETAMINOPHEN 5-325 MG PO TABS: 1.0000 | ORAL_TABLET | ORAL | Status: DC | PRN

## 2014-02-14 NOTE — ED Notes (Signed)
Declined W/C at D/C and was escorted to lobby by RN. 

## 2014-02-14 NOTE — ED Provider Notes (Signed)
CSN: [id number]     Arrival date & time 02/14/14  1141 History  This chart was scribed for Oswaldo ConroyVictoria Adilene Areola, PA-C, working with Derwood KaplanAnkit Nanavati, MD by Chestine SporeSoijett Blue, ED Scribe. The patient was seen in room TR10C/TR10C at 12:38 PM.    Chief Complaint  Patient presents with  . Back Pain     The history is provided by the patient. No language interpreter was used.   HPI Comments: Erin Ruiz is a 23 y.o. female who presents to the Emergency Department complaining of worsening right low back pain onset 1 month. The back pain goes down to her tailbone. She thought that the pain initially came from her working out. The pain is worsened at night. She reports that the pain increases with movement and bending down. She reports that the pain is also cramping as if her period wants to come. She states that she has tried ibuprofen with mild relief for her symptoms. The pain feels good when it is touched and it relieves the pain. She denies abdominal pain, dysuria, frequency, foul smelling urine, loss of control of bladder/bowels, numbness, tingling, weakness, and any other symptoms.    Past Medical History  Diagnosis Date  . Heart murmur    Past Surgical History  Procedure Laterality Date  . Tonsillectomy     Family History  Problem Relation Age of Onset  . Hypertension Mother   . Asthma Father    History  Substance Use Topics  . Smoking status: Former Games developermoker  . Smokeless tobacco: Not on file  . Alcohol Use: No   OB History    Gravida Para Term Preterm AB TAB SAB Ectopic Multiple Living   2 2 2       2      Review of Systems  Gastrointestinal: Negative for abdominal pain.       No loss of bowel  Genitourinary: Negative for dysuria and frequency.       No loss of urine  Musculoskeletal: Positive for myalgias and back pain.  Neurological: Negative for weakness and numbness.      Allergies  Review of patient's allergies indicates no known allergies.  Home Medications   Prior  to Admission medications   Medication Sig Start Date End Date Taking? Authorizing Provider  METROGEL VAGINAL 0.75 % vaginal gel Place 1 Applicatorful vaginally at bedtime. For 5 days. 09/30/13   Amy Dessa PhiHowell Wren, CNM  oxyCODONE-acetaminophen (PERCOCET/ROXICET) 5-325 MG per tablet Take 1 tablet by mouth every 4 (four) hours as needed for severe pain. 02/14/14   Louann SjogrenVictoria L Louana Fontenot, PA-C  Prenatal Vit-Fe Fumarate-FA (PRENATAL MULTIVITAMIN) TABS tablet Take 1 tablet by mouth daily at 12 noon.    Historical Provider, MD  sertraline (ZOLOFT) 50 MG tablet Take 1 tablet (50 mg total) by mouth daily. 08/12/13   Brock Badharles A [name], MD   BP 117/73 mmHg  Pulse 79  Temp(Src) 97.9 F (36.6 C)  Resp 18  Ht 5' 3.5" (1.613 m)  Wt 193 lb (87.544 kg)  BMI 33.65 kg/m2  SpO2 100%  Physical Exam  Constitutional: She appears well-developed and well-nourished. No distress.  HENT:  Head: Normocephalic and atraumatic.  Eyes: Conjunctivae are normal. Right eye exhibits no discharge. Left eye exhibits no discharge.  Cardiovascular: Normal rate, regular rhythm and normal heart sounds.   Pulmonary/Chest: Effort normal and breath sounds normal. No respiratory distress. She has no wheezes.  Abdominal: Soft. Bowel sounds are normal. She exhibits no distension. There is no tenderness.  Musculoskeletal:  No midline back tenderness, step off or crepitus. Right and left sided lower back tenderness. No CVA tenderness.   Neurological: She is alert. Coordination normal.  Equal muscle tone. 5/5 strength in lower extremities. DTR equal and intact. Negative straight leg test. Normal gait.   Skin: Skin is warm and dry. She is not diaphoretic.  Nursing note and vitals reviewed.   ED Course  Procedures (including critical care time) DIAGNOSTIC STUDIES: Oxygen Saturation is 100% on room air, normal by my interpretation.    COORDINATION OF CARE: 12:49 PM-Discussed treatment plan which includes Percocet Rx, ibuprofen, heating  pads, and referral to chiropractor with pt at bedside and pt agreed to plan.   Labs Review Labs Reviewed - No data to display  Imaging Review No results found.   EKG Interpretation None      MDM   Final diagnoses:  Bilateral low back pain without sciatica   Patient with back pain. No loss of bowel or bladder control. No saddle anesthesia. No fever, night sweats, weight loss, h/o cancer, IVDU. VSS. No neurological deficits and normal neuro exam. Patient can walk but states is painful. No concern for cauda equina.  RICE protocol and pain medicine, percocet indicated and discussed with patient. Patient currently lactating. Discussed the risks versus benefits of taking narcotic pain medicine while breast-feeding. Patient instructed to call her OB/GYN and discussed the risks further before starting the medication. Driving and sedation precautions provided. Patient is afebrile, nontoxic, and in no acute distress. Patient is appropriate for outpatient management and is stable for discharge.  Discussed return precautions with patient. Discussed all results and patient verbalizes understanding and agrees with plan.  I personally performed the services described in this documentation, which was scribed in my presence. The recorded information has been reviewed and is accurate.    Louann SjogrenVictoria L Rupa Lagan, PA-C 02/14/14 1710  Derwood KaplanAnkit Nanavati, MD 02/14/14 [id number]

## 2014-02-14 NOTE — Discharge Instructions (Signed)
Return to the emergency room with worsening of symptoms, new symptoms or with symptoms that are concerning , especially fevers, loss of control of bladder or bowels, numbness or tingling around genital region or anus, weakness. RICE: Rest, Ice (three cycles of 20 mins on, 13mins off at least twice a day), compression/brace, elevation. Heating pad works well for back pain. Ibuprofen $RemoveBeforeD'400mg'[id number]$  (2 tablets $RemoveBe'200mg'NCDOKaxLl$ ) every 5-6 hours for 3-5 days and then as needed for pain. Percocet for severe pain. Call to discuss use of Percocet during breast-feeding with your OB/GYN before taking this medication. Do not operate machinery, drive or drink alcohol while taking narcotics or muscle relaxers. Follow up with PCP/orthopedist if symptoms worsen or are persistent.   Back Injury Prevention Back injuries can be extremely painful and difficult to heal. After having one back injury, you are much more likely to experience another later on. It is important to learn how to avoid injuring or re-injuring your back. The following tips can help you to prevent a back injury. PHYSICAL FITNESS  Exercise regularly and try to develop good tone in your abdominal muscles. Your abdominal muscles provide a lot of the support needed by your back.  Do aerobic exercises (walking, jogging, biking, swimming) regularly.  Do exercises that increase balance and strength (tai chi, yoga) regularly. This can decrease your risk of falling and injuring your back.  Stretch before and after exercising.  Maintain a healthy weight. The more you weigh, the more stress is placed on your back. For every pound of weight, 10 times that amount of pressure is placed on the back. DIET  Talk to your caregiver about how much calcium and vitamin D you need per day. These nutrients help to prevent weakening of the bones (osteoporosis). Osteoporosis can cause broken (fractured) bones that lead to back pain.  Include good sources of calcium in your diet, such  as dairy products, green, leafy vegetables, and products with calcium added (fortified).  Include good sources of vitamin D in your diet, such as milk and foods that are fortified with vitamin D.  Consider taking a nutritional supplement or a multivitamin if needed.  Stop smoking if you smoke. POSTURE  Sit and stand up straight. Avoid leaning forward when you sit or hunching over when you stand.  Choose chairs with good low back (lumbar) support.  If you work at a desk, sit close to your work so you do not need to lean over. Keep your chin tucked in. Keep your neck drawn back and elbows bent at a right angle. Your arms should look like the letter "L."  Sit high and close to the steering wheel when you drive. Add a lumbar support to your car seat if needed.  Avoid sitting or standing in one position for too long. Take breaks to get up, stretch, and walk around at least once every hour. Take breaks if you are driving for long periods of time.  Sleep on your side with your knees slightly bent, or sleep on your back with a pillow under your knees. Do not sleep on your stomach. LIFTING, TWISTING, AND REACHING  Avoid heavy lifting, especially repetitive lifting. If you must do heavy lifting:  Stretch before lifting.  Work slowly.  Rest between lifts.  Use carts and dollies to move objects when possible.  Make several small trips instead of carrying 1 heavy load.  Ask for help when you need it.  Ask for help when moving big, awkward objects.  Follow these  steps when lifting:  Stand with your feet shoulder-width apart.  Get as close to the object as you can. Do not try to pick up heavy objects that are far from your body.  Use handles or lifting straps if they are available.  Bend at your knees. Squat down, but keep your heels off the floor.  Keep your shoulders pulled back, your chin tucked in, and your back straight.  Lift the object slowly, tightening the muscles in your  legs, abdomen, and buttocks. Keep the object as close to the center of your body as possible.  When you put a load down, use these same guidelines in reverse.  Do not:  Lift the object above your waist.  Twist at the waist while lifting or carrying a load. Move your feet if you need to turn, not your waist.  Bend over without bending at your knees.  Avoid reaching over your head, across a table, or for an object on a high surface. OTHER TIPS  Avoid wet floors and keep sidewalks clear of ice to prevent falls.  Do not sleep on a mattress that is too soft or too hard.  Keep items that are used frequently within easy reach.  Put heavier objects on shelves at waist level and lighter objects on lower or higher shelves.  Find ways to decrease your stress, such as exercise, massage, or relaxation techniques. Stress can build up in your muscles. Tense muscles are more vulnerable to injury.  Seek treatment for depression or anxiety if needed. These conditions can increase your risk of developing back pain. SEEK MEDICAL CARE IF:  You injure your back.  You have questions about diet, exercise, or other ways to prevent back injuries. MAKE SURE YOU:  Understand these instructions.  Will watch your condition.  Will get help right away if you are not doing well or get worse. Document Released: 03/29/2004 Document Revised: 05/14/2011 Document Reviewed: 04/02/2011 [facility] Patient Information 2015 [place], [place]. This information is not intended to replace advice given to you by your health care provider. Make sure you discuss any questions you have with your health care provider.

## 2014-02-14 NOTE — ED Notes (Signed)
Pt reports right side lower back pain for weeks, increases with movement and bending down. Denies any urinary symptoms.

## 2014-02-28 ENCOUNTER — Encounter (HOSPITAL_COMMUNITY): Payer: Self-pay | Admitting: *Deleted

## 2014-02-28 ENCOUNTER — Emergency Department (INDEPENDENT_AMBULATORY_CARE_PROVIDER_SITE_OTHER)
Admission: EM | Admit: 2014-02-28 | Discharge: 2014-02-28 | Disposition: A | Discharge status: Home / Self Care | Payer: Medicaid Other | Source: Home / Self Care | Attending: Family Medicine | Admitting: Family Medicine

## 2014-02-28 DIAGNOSIS — M545 Low back pain, unspecified: Secondary | ICD-10-CM

## 2014-02-28 LAB — POCT URINALYSIS DIP (DEVICE)
Bilirubin Urine: NEGATIVE
GLUCOSE, UA: NEGATIVE mg/dL
Ketones, ur: NEGATIVE mg/dL
Nitrite: NEGATIVE
PROTEIN: NEGATIVE mg/dL
SPECIFIC GRAVITY, URINE: 1.025 (ref 1.005–1.030)
Urobilinogen, UA: 0.2 mg/dL (ref 0.0–1.0)
pH: 5.5 (ref 5.0–8.0)

## 2014-02-28 LAB — POCT PREGNANCY, URINE: Preg Test, Ur: NEGATIVE

## 2014-02-28 MED ORDER — NAPROXEN 500 MG PO TABS: 500.0000 mg | ORAL_TABLET | Freq: Two times a day (BID) | ORAL | Status: DC

## 2014-02-28 NOTE — Discharge Instructions (Signed)
Back Exercises °Back exercises help treat and prevent back injuries. The goal of back exercises is to increase the strength of your abdominal and back muscles and the flexibility of your back. These exercises should be started when you no longer have back pain. Back exercises include: °· Pelvic Tilt. Lie on your back with your knees bent. Tilt your pelvis until the lower part of your back is against the floor. Hold this position 5 to 10 sec and repeat 5 to 10 times. °· Knee to Chest. Pull first 1 knee up against your chest and hold for 20 to 30 seconds, repeat this with the other knee, and then both knees. This may be done with the other leg straight or bent, whichever feels better. °· Sit-Ups or Curl-Ups. Bend your knees 90 degrees. Start with tilting your pelvis, and do a partial, slow sit-up, lifting your trunk only 30 to 45 degrees off the floor. Take at least 2 to 3 seconds for each sit-up. Do not do sit-ups with your knees out straight. If partial sit-ups are difficult, simply do the above but with only tightening your abdominal muscles and holding it as directed. °· Hip-Lift. Lie on your back with your knees flexed 90 degrees. Push down with your feet and shoulders as you raise your hips a couple inches off the floor; hold for 10 seconds, repeat 5 to 10 times. °· Back arches. Lie on your stomach, propping yourself up on bent elbows. Slowly press on your hands, causing an arch in your low back. Repeat 3 to 5 times. Any initial stiffness and discomfort should lessen with repetition over time. °· Shoulder-Lifts. Lie face down with arms beside your body. Keep hips and torso pressed to floor as you slowly lift your head and shoulders off the floor. °Do not overdo your exercises, especially in the beginning. Exercises may cause you some mild back discomfort which lasts for a few minutes; however, if the pain is more severe, or lasts for more than 15 minutes, do not continue exercises until you see your caregiver.  Improvement with exercise therapy for back problems is slow.  °See your caregivers for assistance with developing a proper back exercise program. °Document Released: 03/29/2004 Document Revised: 05/14/2011 Document Reviewed: 12/21/2010 °ExitCare® Patient Information ©2015 [employer]. This information is not intended to replace advice given to you by your health care provider. Make sure you discuss any questions you have with your health care provider. ° °Back Pain, Adult °Low back pain is very common. About 1 in 5 people have back pain. The cause of low back pain is rarely dangerous. The pain often gets better over time. About half of people with a sudden onset of back pain feel better in just 2 weeks. About 8 in 10 people feel better by 6 weeks.  °CAUSES °Some common causes of back pain include: °· Strain of the muscles or ligaments supporting the spine. °· Wear and tear (degeneration) of the spinal discs. °· Arthritis. °· Direct injury to the back. °DIAGNOSIS °Most of the time, the direct cause of low back pain is not known. However, back pain can be treated effectively even when the exact cause of the pain is unknown. Answering your caregiver's questions about your overall health and symptoms is one of the most accurate ways to make sure the cause of your pain is not dangerous. If your caregiver needs more information, he or she may order lab work or imaging tests (X-rays or MRIs). However, even if imaging tests show changes in your   back, this usually does not require surgery. HOME CARE INSTRUCTIONS For many people, back pain returns.Since low back pain is rarely dangerous, it is often a condition that people can learn to [facility] LLCmanageon their own.   Remain active. It is stressful on the back to sit or stand in one place. Do not sit, drive, or stand in one place for more than 30 minutes at a time. Take short walks on level surfaces as soon as pain allows.Try to increase the length of time you walk each  day.  Do not stay in bed.Resting more than 1 or 2 days can delay your recovery.  Do not avoid exercise or work.Your body is made to move.It is not dangerous to be active, even though your back may hurt.Your back will likely heal faster if you return to being active before your pain is gone.  Pay attention to your body when you bend and lift. Many people have less discomfortwhen lifting if they bend their knees, keep the load close to their bodies,and avoid twisting. Often, the most comfortable positions are those that put less stress on your recovering back.  Find a comfortable position to sleep. Use a firm mattress and lie on your side with your knees slightly bent. If you lie on your back, put a pillow under your knees.  Only take over-the-counter or prescription medicines as directed by your caregiver. Over-the-counter medicines to reduce pain and inflammation are often the most helpful.Your caregiver may prescribe muscle relaxant drugs.These medicines help dull your pain so you can more quickly return to your normal activities and healthy exercise.  Put ice on the injured area.  Put ice in a plastic bag.  Place a towel between your skin and the bag.  Leave the ice on for 15-20 minutes, 03-04 times a day for the first 2 to 3 days. After that, ice and heat may be alternated to reduce pain and spasms.  Ask your caregiver about trying back exercises and gentle massage. This may be of some benefit.  Avoid feeling anxious or stressed.Stress increases muscle tension and can worsen back pain.It is important to recognize when you are anxious or stressed and learn ways to manage it.Exercise is a great option. SEEK MEDICAL CARE IF:  You have pain that is not relieved with rest or medicine.  You have pain that does not improve in 1 week.  You have new symptoms.  You are generally not feeling well. SEEK IMMEDIATE MEDICAL CARE IF:   You have pain that radiates from your back into  your legs.  You develop new bowel or bladder control problems.  You have unusual weakness or numbness in your arms or legs.  You develop nausea or vomiting.  You develop abdominal pain.  You feel faint. Document Released: 02/19/2005 Document Revised: 08/21/2011 Document Reviewed: 06/23/2013 [facility] Patient Information 2015 [place], [place]. This information is not intended to replace advice given to you by your health care provider. Make sure you discuss any questions you have with your health care provider.  Sacroiliac Joint Dysfunction The sacroiliac joint connects the lower part of the spine (the sacrum) with the bones of the pelvis. CAUSES  Sometimes, there is no obvious reason for sacroiliac joint dysfunction. Other times, it may occur   During pregnancy.  After injury, such as:  Car accidents.  Sport-related injuries.  Work-related injuries.  Due to one leg being shorter than the other.  Due to other conditions that affect the joints, such as:  Rheumatoid arthritis.  Gout.  Psoriasis.  Joint infection (septic arthritis). SYMPTOMS  Symptoms may include:  Pain in the:  Lower back.  Buttocks.  Groin.  Thighs and legs.  Difficult sitting, standing, walking, lying, bending or lifting. DIAGNOSIS  A number of tests may be used to help diagnose the cause of sacroiliac joint dysfunction, including:  Imaging tests to look for other causes of pain, including:  MRI.  CT scan.  Bone scan.  Diagnostic injection: During a special x-ray (called fluoroscopy), a needle is put into the sacroiliac joint. A numbing medicine is injected into the joint. If the pain is improved or stopped, the diagnosis of sacroiliac joint dysfunction is more likely. TREATMENT  There are a number of types of treatment used for sacroiliac joint dysfunction, including:  Only take over-the-counter or prescription medicines for pain, discomfort, or fever as directed by your  caregiver.  Medications to relax muscles.  Rest. Decreasing activity can help cut down on painful muscle spasms and allow the back to heal.  Application of heat or ice to the lower back may improve muscle spasms and soothe pain.  Brace. A special back brace, called a sacroiliac belt, can help support the joint while your back is healing.  Physical therapy can help teach comfortable positions and exercises to strengthen muscles that support the sacroiliac joint.  Cortisone injections. Injections of steroid medicine into the joint can help decrease swelling and improve pain.  Hyaluronic acid injections. This chemical improves lubrication within the sacroiliac joint, thereby decreasing pain.  Radiofrequency ablation. A special needle is placed into the joint, where it burns away nerves that are carrying pain messages from the joint.  Surgery. Because pain occurs during movement of the joint, screws and plates may be installed in order to limit or prevent joint motion. HOME CARE INSTRUCTIONS   Take all medications exactly as directed.  Follow instructions regarding both rest and physical activity, to avoid worsening the pain.  Do physical therapy exercises exactly as prescribed. SEEK IMMEDIATE MEDICAL CARE IF:  You experience increasingly severe pain.  You develop new symptoms, such as numbness or tingling in your legs or feet.  You lose bladder or bowel control. Document Released: 05/18/2008 Document Revised: 05/14/2011 Document Reviewed: 05/18/2008 [facility]ExitCare Patient Information 2015 [place]ExitCare, [place]LLC. This information is not intended to replace advice given to you by your health care provider. Make sure you discuss any questions you have with your health care provider.

## 2014-02-28 NOTE — ED Notes (Signed)
C/o low back pain onset 6 weeks ago. Has 6 mos. Infant.  No known injury. Had to stop her work outs. Pain is sharp and radiates down back of both thighs to her distal thigh.  Has tried icy hot, heating pads and Ibuprofen.  Went to ED 2 weeks ago and was told it was muscle pain.  Given Rx. Percocet that helped the pain but not the problem.  [facility]

## 2014-02-28 NOTE — ED Provider Notes (Signed)
CSN: [id number]     Arrival date & time 02/28/14  1143 History   First MD Initiated Contact with Patient 02/28/14 1152     Chief Complaint  Patient presents with  . Back Pain   (Consider location/radiation/quality/duration/timing/severity/associated sxs/prior Treatment) HPI Comments: [facility] Reports no changes in her symptoms since ER evaluation.  Occasional vaginal spotting and is spotting currently. Currently breast feeding s/p delivery May 2015.   Patient is a 23 y.o. female presenting with back pain. The history is provided by the patient.  Back Pain Location:  Lumbar spine Quality:  Aching Radiates to:  L posterior upper leg and R posterior upper leg Onset quality:  Gradual Duration:  6 weeks Progression:  Waxing and waning Chronicity:  New Worsened by:  Bending, ambulation, movement, sitting, standing and twisting Ineffective treatments:  Narcotics Associated symptoms: no abdominal pain, no abdominal swelling, no bladder incontinence, no bowel incontinence, no dysuria, no fever, no headaches, no leg pain, no numbness, no paresthesias, no pelvic pain, no perianal numbness, no tingling, no weakness and no weight loss     Past Medical History  Diagnosis Date  . Heart murmur    Past Surgical History  Procedure Laterality Date  . Tonsillectomy  2004   Family History  Problem Relation Age of Onset  . Hypertension Mother   . Asthma Father    History  Substance Use Topics  . Smoking status: Former Games developermoker  . Smokeless tobacco: Not on file  . Alcohol Use: No   OB History    Gravida Para Term Preterm AB TAB SAB Ectopic Multiple Living   2 2 2       2      Review of Systems  Constitutional: Negative for fever, chills and weight loss.  HENT: Negative.   Eyes: Negative.   Respiratory: Negative.   Cardiovascular: Negative.   Gastrointestinal: Negative.  Negative for abdominal  pain and bowel incontinence.  Genitourinary: Negative for bladder incontinence, dysuria, urgency, frequency, hematuria, flank pain, decreased urine volume, vaginal bleeding, vaginal discharge, difficulty urinating, genital sores, vaginal pain, menstrual problem and pelvic pain.  Musculoskeletal: Positive for back pain. Negative for neck pain.  Skin: Negative.   Neurological: Negative for dizziness, tingling, weakness, numbness, headaches and paresthesias.    Allergies  Review of patient's allergies indicates no known allergies.  Home Medications   Prior to Admission medications   Medication Sig Start Date End Date Taking? Authorizing Provider  b complex vitamins tablet Take 1 tablet by mouth daily.   Yes Historical Provider, MD  sertraline (ZOLOFT) 50 MG tablet Take 1 tablet (50 mg total) by mouth daily. 08/12/13  Yes Brock Badharles A [name], MD  METROGEL VAGINAL 0.75 % vaginal gel Place 1 Applicatorful vaginally at bedtime. For 5 days. 09/30/13   Amy Dessa PhiHowell Wren, CNM  naproxen (NAPROSYN) 500 MG tablet Take 1 tablet (500 mg total) by mouth 2 (two) times daily with a meal. For back pain Do not use this medication in combination with ibuprofen. 02/28/14   Mathis FareJennifer Lee H Garnetta Fedrick, PA  oxyCODONE-acetaminophen (PERCOCET/ROXICET) 5-325 MG per tablet Take 1 tablet by mouth every 4 (four) hours as needed for severe pain. 02/14/14   Louann SjogrenVictoria L Creech, PA-C  Prenatal Vit-Fe Fumarate-FA (PRENATAL MULTIVITAMIN) TABS tablet Take 1 tablet by mouth daily at 12 noon.    Historical Provider, MD   BP 125/68 mmHg  Pulse 74  Temp(Src) 98.9 F (37.2 C) (Oral)  Resp 16  SpO2 97%  LMP 02/27/2014  Breastfeeding? Yes Physical Exam  Constitutional: She is oriented to person, place, and time. She appears well-developed.  HENT:  Head: Normocephalic and atraumatic.  Eyes: Conjunctivae are normal. No scleral icterus.  Cardiovascular: Normal rate, regular rhythm and normal heart sounds.   Pulmonary/Chest: Effort normal  and breath sounds normal.  Abdominal: Soft. Normal appearance and bowel sounds are normal. She exhibits no distension and no mass. There is no tenderness. There is no CVA tenderness.  Musculoskeletal: Normal range of motion.       Lumbar back: She exhibits tenderness. She exhibits normal range of motion, no bony tenderness, no swelling, no edema, no deformity, no laceration, no pain, no spasm and normal pulse.       Back:  Outlined area is region of discomfort with movement.  No midline tenderness +SLR on left -SLR on right  No SI joint point tenderness.  CSM exam of bilateral lower extremities intact  Neurological: She is alert and oriented to person, place, and time.  Skin: Skin is warm and dry. No rash noted. No erythema.  Psychiatric: She has a normal mood and affect. Her behavior is normal.  Nursing note and vitals reviewed.   ED Course  Procedures (including critical care time) Labs Review Labs Reviewed  POCT URINALYSIS DIP (DEVICE) - Abnormal; Notable for the following:    Hgb urine dipstick MODERATE (*)    Leukocytes, UA SMALL (*)    All other components within normal limits  POCT PREGNANCY, URINE    Imaging Review No results found.   MDM   1. Bilateral low back pain without sciatica    Persistent lumbago without neuromuscular deficit or clinical indication of cauda equina syndrome. Oral corticosteroid generally regarded as contraindicated in breastfeeding, therefore, will not prescribe. Naprosyn generally regarded as safe in breastfeeding.  Will advise using Naproxen as prescribed with PCP follow up as scheduled. May need referral for PT.    Ria ClockJennifer Lee H Nicosha Struve, PA 02/28/14 1251

## 2014-03-01 ENCOUNTER — Encounter: Payer: Self-pay | Admitting: *Deleted

## 2014-03-02 ENCOUNTER — Encounter: Payer: Self-pay | Admitting: Obstetrics & Gynecology

## 2014-03-03 ENCOUNTER — Ambulatory Visit: Payer: Medicaid Other | Attending: Internal Medicine | Admitting: Internal Medicine

## 2014-03-03 ENCOUNTER — Encounter: Payer: Self-pay | Admitting: Internal Medicine

## 2014-03-03 VITALS — BP 128/65 | HR 83 | Temp 98.2°F | Resp 16 | Ht 63.5 in | Wt 193.0 lb

## 2014-03-03 DIAGNOSIS — M545 Low back pain, unspecified: Secondary | ICD-10-CM

## 2014-03-03 DIAGNOSIS — Z79899 Other long term (current) drug therapy: Secondary | ICD-10-CM | POA: Insufficient documentation

## 2014-03-03 DIAGNOSIS — R251 Tremor, unspecified: Secondary | ICD-10-CM

## 2014-03-03 DIAGNOSIS — Z87891 Personal history of nicotine dependence: Secondary | ICD-10-CM | POA: Diagnosis not present

## 2014-03-03 DIAGNOSIS — Z2821 Immunization not carried out because of patient refusal: Secondary | ICD-10-CM

## 2014-03-03 LAB — POCT GLYCOSYLATED HEMOGLOBIN (HGB A1C): Hemoglobin A1C: 5.3

## 2014-03-03 LAB — GLUCOSE, POCT (MANUAL RESULT ENTRY): POC Glucose: 88 mg/dl (ref 70–99)

## 2014-03-03 NOTE — Progress Notes (Signed)
Pt is here to establish care. Pt reports having lower back pain for 7 weeks. Pt with to an UC and was prescribed naproxen and she said that it really help controlled her pain.

## 2014-03-03 NOTE — Progress Notes (Signed)
Patient ID: Erin Ruiz, female   DOB: May 13, 1990, 23 y.o.   MRN: [id number]  ZHY:[id number]:[id number]  EXB:[id number]  DOB - May 13, 1990  CC:  Chief Complaint  Patient presents with  . Establish Care       HPI: Erin Ruiz is a 23 y.o. female here today to establish medical care.  Patient reports that she has been having lower back pain for the past 7 weeks.  She has been to the ER twice for similar c/o.  She denies injury.  The pain is achy in nature and does not radiate. She has been taking 6-8 ibuprofen tablets per day or naproxen.  She denies bowel or bladder dysfunction.  She is currently breastfeeding. She denies dysuria, abdominal pain, fever, chills, nausea, or vomiting.  She also c/o of shakiness when she goes a certain amount of time without eating.  She reports that when she eats the shakiness and diaphoresis improves.    Patient has No headache, No chest pain, No abdominal pain - No Nausea, No new weakness tingling or numbness, No Cough - SOB.  No Known Allergies Past Medical History  Diagnosis Date  . Heart murmur    Current Outpatient Prescriptions on File Prior to Visit  Medication Sig Dispense Refill  . b complex vitamins tablet Take 1 tablet by mouth daily.    . naproxen (NAPROSYN) 500 MG tablet Take 1 tablet (500 mg total) by mouth 2 (two) times daily with a meal. For back pain Do not use this medication in combination with ibuprofen. 30 tablet 0  . sertraline (ZOLOFT) 50 MG tablet Take 1 tablet (50 mg total) by mouth daily. 30 tablet 5  . METROGEL VAGINAL 0.75 % vaginal gel Place 1 Applicatorful vaginally at bedtime. For 5 days. (Patient not taking: Reported on 03/03/2014) 70 g 0  . oxyCODONE-acetaminophen (PERCOCET/ROXICET) 5-325 MG per tablet Take 1 tablet by mouth every 4 (four) hours as needed for severe pain. (Patient not taking: Reported on 03/03/2014) 10 tablet 0  . Prenatal Vit-Fe Fumarate-FA (PRENATAL MULTIVITAMIN) TABS tablet Take 1 tablet by mouth daily at 12  noon.     No current facility-administered medications on file prior to visit.   Family History  Problem Relation Age of Onset  . Hypertension Mother   . Asthma Father    History   Social History  . Marital Status: Single    Spouse Name: N/A    Number of Children: N/A  . Years of Education: N/A   Occupational History  . Not on file.   Social History Main Topics  . Smoking status: Former Games developermoker  . Smokeless tobacco: Not on file  . Alcohol Use: No  . Drug Use: No  . Sexual Activity:    Partners: Male    Birth Control/ Protection: None   Other Topics Concern  . Not on file   Social History Narrative    Review of Systems: See HPI   Objective:   Filed Vitals:   03/03/14 1707  BP: 128/65  Pulse: 83  Temp: 98.2 F (36.8 C)  Resp: 16    Physical Exam  Constitutional: She is oriented to person, place, and time.  Cardiovascular: Normal rate, regular rhythm and normal heart sounds.   Pulmonary/Chest: Effort normal and breath sounds normal.  Abdominal: Soft. She exhibits no distension. There is no tenderness.  Musculoskeletal: She exhibits no tenderness.  Neurological: She is alert and oriented to person, place, and time.  Skin: Skin is warm and dry.  Lab Results  Component Value Date   WBC 11.7* 07/31/2013   HGB 11.0* 07/31/2013   HCT 33.6* 07/31/2013   MCV 89.4 07/31/2013   PLT 156 07/31/2013   No results found for: CREATININE, BUN, NA, K, CL, CO2  No results found for: HGBA1C Lipid Panel  No results found for: CHOL, TRIG, HDL, CHOLHDL, VLDL, LDLCALC     Assessment and plan:   Erin Ruiz was seen today for establish care.  Diagnoses and associated orders for this visit:  Bilateral low back pain without sciatica Advised patient that the only safe medication to take while breastfeeding is tylenol.  Counseled the patient on the dangers of excessive NSAID use and possible effects on her child.   Shakiness - POCT glucose (manual entry) - POCT  glycosylated hemoglobin (Hb A1C)--not diabetic   Refused influenza vaccine Educated patient on benefits of vaccine especially with small [place] and breastfeeding   May follow up PRN.   The patient was given clear instructions to go to ER or return to medical center if symptoms don't improve, worsen or new problems develop. The patient verbalized understanding. The patient was told to call to get lab results if they haven't heard anything in the next week.     Holland CommonsKECK, VALERIE, NP-C [facility] [phone] 03/03/2014, 5:16 PM

## 2014-03-15 ENCOUNTER — Telehealth: Payer: Self-pay | Admitting: *Deleted

## 2014-03-15 ENCOUNTER — Telehealth: Payer: Self-pay | Admitting: Internal Medicine

## 2014-03-15 DIAGNOSIS — F53 Postpartum depression: Principal | ICD-10-CM

## 2014-03-15 DIAGNOSIS — O99345 Other mental disorders complicating the puerperium: Secondary | ICD-10-CM

## 2014-03-15 MED ORDER — SERTRALINE HCL 50 MG PO TABS: 50.0000 mg | ORAL_TABLET | Freq: Every day | ORAL | Status: DC

## 2014-03-15 NOTE — Telephone Encounter (Signed)
Pt requesting Refill Zoloft Stated was prescribe by GYN in the past but not longer going to him

## 2014-03-15 NOTE — Telephone Encounter (Signed)
Pt had appt with PCP on 02/04/14 and forgot to request refill on anti-depressants. Please f/u with pt.

## 2014-03-15 NOTE — Telephone Encounter (Signed)
May refill. 4 refills.

## 2014-03-15 NOTE — Telephone Encounter (Signed)
Rx Zoloft was refill, per Holland CommonsKeck, Valerie Pt aware

## 2014-03-29 ENCOUNTER — Ambulatory Visit: Payer: Medicaid Other | Admitting: Internal Medicine

## 2014-05-10 ENCOUNTER — Ambulatory Visit: Payer: Medicaid Other | Attending: Internal Medicine | Admitting: Internal Medicine

## 2014-05-10 ENCOUNTER — Encounter: Payer: Self-pay | Admitting: Internal Medicine

## 2014-05-10 VITALS — BP 112/69 | HR 82 | Temp 98.0°F | Resp 16 | Ht 63.5 in | Wt 175.0 lb

## 2014-05-10 DIAGNOSIS — R519 Headache, unspecified: Secondary | ICD-10-CM

## 2014-05-10 DIAGNOSIS — R110 Nausea: Secondary | ICD-10-CM | POA: Insufficient documentation

## 2014-05-10 DIAGNOSIS — Z87891 Personal history of nicotine dependence: Secondary | ICD-10-CM | POA: Insufficient documentation

## 2014-05-10 DIAGNOSIS — R51 Headache: Secondary | ICD-10-CM

## 2014-05-10 DIAGNOSIS — H53149 Visual discomfort, unspecified: Secondary | ICD-10-CM | POA: Diagnosis not present

## 2014-05-10 LAB — CBC
HEMATOCRIT: 41.8 % (ref 36.0–46.0)
HEMOGLOBIN: 13.7 g/dL (ref 12.0–15.0)
MCH: 29.3 pg (ref 26.0–34.0)
MCHC: 32.8 g/dL (ref 30.0–36.0)
MCV: 89.5 fL (ref 78.0–100.0)
MPV: 9.8 fL (ref 8.6–12.4)
Platelets: 233 10*3/uL (ref 150–400)
RBC: 4.67 MIL/uL (ref 3.87–5.11)
RDW: 12.8 % (ref 11.5–15.5)
WBC: 6.2 10*3/uL (ref 4.0–10.5)

## 2014-05-10 MED ORDER — CYCLOBENZAPRINE HCL 10 MG PO TABS: 10.0000 mg | ORAL_TABLET | Freq: Every evening | ORAL | Status: DC | PRN

## 2014-05-10 NOTE — Patient Instructions (Addendum)
May take muslce relaxant at bedtime and pump and dump before next breastfeeding. Ease off ibuprofen and switch to tylenol extra strength for headaches.  Once you stop breastfeeding we will look at other options to treat your headaches

## 2014-05-10 NOTE — Progress Notes (Signed)
Pt is here today c/o frequent headaches. Pt states that her lower back is still having pain. She said that exercise is helping her pain some.

## 2014-05-10 NOTE — Progress Notes (Signed)
Patient ID: Erin Ruiz, female   DOB: 27-Mar-1990, 24 y.o.   MRN: [id number]  CC: headaches, lower back pain   HPI: Erin Ruiz is a 24 y.o. female here today to establish medical care. Patient reports that she has been having frequent daily headaches. Headaches are associated with nausea, photophobia, phonophobia.  She states that she had similar frequent headaches as a teenager and was told to change her diet. She has never been on any headache prophylaxis medications. She is currently breastfeeding her 410 month old child. She has tried ibuprofen only for pain. Patient has begun a workout regimen to see if that will help with weight loss and pain but notices the pain gets much more intense during workout.   Patient has No chest pain, No abdominal pain - No vomiting, No new weakness tingling or numbness, No Cough - SOB.  No Known Allergies Past Medical History  Diagnosis Date  . Heart murmur    Current Outpatient Prescriptions on File Prior to Visit  Medication Sig Dispense Refill  . sertraline (ZOLOFT) 50 MG tablet Take 1 tablet (50 mg total) by mouth daily. 30 tablet 4  . b complex vitamins tablet Take 1 tablet by mouth daily.    [name]. METROGEL VAGINAL 0.75 % vaginal gel Place 1 Applicatorful vaginally at bedtime. For 5 days. (Patient not taking: Reported on 03/03/2014) 70 g 0  . naproxen (NAPROSYN) 500 MG tablet Take 1 tablet (500 mg total) by mouth 2 (two) times daily with a meal. For back pain Do not use this medication in combination with ibuprofen. (Patient not taking: Reported on 05/10/2014) 30 tablet 0  . oxyCODONE-acetaminophen (PERCOCET/ROXICET) 5-325 MG per tablet Take 1 tablet by mouth every 4 (four) hours as needed for severe pain. (Patient not taking: Reported on 03/03/2014) 10 tablet 0  . Prenatal Vit-Fe Fumarate-FA (PRENATAL MULTIVITAMIN) TABS tablet Take 1 tablet by mouth daily at 12 noon.     No current facility-administered medications on file prior to visit.    Family History  Problem Relation Age of Onset  . Hypertension Mother   . Asthma Father    History   Social History  . Marital Status: Single    Spouse Name: N/A  . Number of Children: N/A  . Years of Education: N/A   Occupational History  . Not on file.   Social History Main Topics  . Smoking status: Former Games developermoker  . Smokeless tobacco: Not on file  . Alcohol Use: No  . Drug Use: No  . Sexual Activity:    Partners: Male    Birth Control/ Protection: None   Other Topics Concern  . Not on file   Social History Narrative    Review of Systems: See HPI   Objective:   Filed Vitals:   05/10/14 0931  BP: 112/69  Pulse: 82  Temp: 98 F (36.7 C)  Resp: 16    Physical Exam  Constitutional: She is oriented to person, place, and time.  Eyes: Pupils are equal, round, and reactive to light. Right eye exhibits no discharge. Left eye exhibits no discharge.  Neck: Normal range of motion.  Cardiovascular: Normal rate, regular rhythm and normal heart sounds.   Pulmonary/Chest: Effort normal and breath sounds normal.  Musculoskeletal: She exhibits no tenderness.  Neurological: She is alert and oriented to person, place, and time. No cranial nerve deficit.  Skin: Skin is warm and dry.  Vitals reviewed.    Lab Results  Component Value Date   WBC  11.7* 07/31/2013   HGB 11.0* 07/31/2013   HCT 33.6* 07/31/2013   MCV 89.4 07/31/2013   PLT 156 07/31/2013   No results found for: CREATININE, BUN, NA, K, CL, CO2  Lab Results  Component Value Date   HGBA1C 5.30 03/03/2014   Lipid Panel  No results found for: CHOL, TRIG, HDL, CHOLHDL, VLDL, LDLCALC     Assessment and plan:   Erin Ruiz was seen today for follow-up.  Diagnoses and all orders for this visit:  Frequent headaches Orders: -     CBC -     Vitamin D, 25-hydroxy -     cyclobenzaprine (FLEXERIL) 10 MG tablet; Take 1 tablet (10 mg total) by mouth at bedtime as needed for muscle spasms. Explained to  patient that she may use tylenol for pain and use flexeril only at bedtime. I explained that she will need to dump the first milk after taking the flexeril to decrease chances of being passed to her daughter. After she finishes breastfeeding we may look at other options for frequent headaches such as topamax or imitrex.   Return if symptoms worsen or fail to improve.       Holland Commons, NP-C [facility] [phone] 05/10/2014, 9:59 AM

## 2014-05-11 ENCOUNTER — Telehealth: Payer: Self-pay | Admitting: *Deleted

## 2014-05-11 LAB — VITAMIN D 25 HYDROXY (VIT D DEFICIENCY, FRACTURES): VIT D 25 HYDROXY: 21 ng/mL — AB (ref 30–100)

## 2014-05-11 NOTE — Telephone Encounter (Signed)
Pt is aware of her lab results.  

## 2014-05-11 NOTE — Telephone Encounter (Signed)
-----   Message from Ambrose FinlandValerie A Keck, NP sent at 05/11/2014 12:20 PM EST ----- Not anemic but slightly low vitamin D. Will be beneficial for her to get OTC Vitamin D 1,000 IU and take daily for a few weeks to raise levels.

## 2014-06-01 ENCOUNTER — Telehealth: Payer: Self-pay | Admitting: Internal Medicine

## 2014-06-01 NOTE — Telephone Encounter (Signed)
Pt called requesting to speak to nurse, pt states medication sertraline (ZOLOFT) 50 MG tablet, has not been working for pt. Pt feels down,please f/u with pt

## 2014-08-16 ENCOUNTER — Ambulatory Visit: Payer: Medicaid Other | Admitting: Internal Medicine

## 2014-08-23 ENCOUNTER — Ambulatory Visit: Payer: Self-pay | Attending: Internal Medicine | Admitting: Internal Medicine

## 2014-08-23 VITALS — BP 118/76 | HR 68 | Wt 173.6 lb

## 2014-08-23 DIAGNOSIS — O99345 Other mental disorders complicating the puerperium: Secondary | ICD-10-CM

## 2014-08-23 DIAGNOSIS — N649 Disorder of breast, unspecified: Secondary | ICD-10-CM

## 2014-08-23 DIAGNOSIS — F329 Major depressive disorder, single episode, unspecified: Secondary | ICD-10-CM

## 2014-08-23 DIAGNOSIS — L988 Other specified disorders of the skin and subcutaneous tissue: Secondary | ICD-10-CM

## 2014-08-23 DIAGNOSIS — F53 Postpartum depression: Secondary | ICD-10-CM

## 2014-08-23 DIAGNOSIS — M25532 Pain in left wrist: Secondary | ICD-10-CM

## 2014-08-23 MED ORDER — SERTRALINE HCL 50 MG PO TABS: 50.0000 mg | ORAL_TABLET | Freq: Every day | ORAL | Status: DC

## 2014-08-23 MED ORDER — BACITRACIN 500 UNIT/GM EX OINT: 1.0000 "application " | TOPICAL_OINTMENT | Freq: Three times a day (TID) | CUTANEOUS | Status: DC

## 2014-08-23 NOTE — Progress Notes (Signed)
   Subjective:    Patient ID: Erin Ruiz, female    DOB: 1990-03-13, 24 y.o.   MRN: [id number]  HPI Comments: Patient reports that she has noticed a small bump on her wrist that gets inflamed at times and becomes painful at night. The pain is sharp and causes her whole hand to ache. She denies injury or trauma.   Also c/o of child biting her nipple while breast feeding 10 days ago that did not heal.   Wrist Pain  The pain is present in the left wrist. This is a new problem. The current episode started more than 1 month ago. There has been no history of extremity trauma. The problem occurs constantly. The problem has been gradually worsening. The quality of the pain is described as aching and sharp. The pain is moderate. Pertinent negatives include no fever, joint swelling or limited range of motion. She has tried acetaminophen and NSAIDS for the symptoms. The treatment provided no relief.    Review of Systems  Constitutional: Negative for fever.  Musculoskeletal: Negative for myalgias, joint swelling, arthralgias and neck stiffness.       Objective:   Physical Exam  Musculoskeletal:  Small subcutaneous area under skin near wrist. Sore to touch.       Assessment & Plan:  Merlynn was seen today for left wrist pain.  Diagnoses and all orders for this visit:  Left wrist pain Orders: -     Ambulatory referral to Sports Medicine Unsure of etiology of pain. Negative phalen's test  Lesion of skin of breast Orders: -     bacitracin 500 UNIT/GM ointment; Apply 1 application topically 3 (three) times daily.  Depression complicating pregnancy, postpartum Orders: -     Refill sertraline (ZOLOFT) 50 MG tablet; Take 1 tablet (50 mg total) by mouth daily.  Return if symptoms worsen or fail to improve.  Holland Commons, NP 08/24/2014 5:21 PM

## 2014-08-23 NOTE — Progress Notes (Signed)
  Pt present today for left wrist pain x 2 months. She states this pain wakes here up at night.

## 2014-08-24 ENCOUNTER — Encounter: Payer: Self-pay | Admitting: Internal Medicine

## 2014-08-30 ENCOUNTER — Ambulatory Visit: Payer: Medicaid Other | Admitting: Sports Medicine

## 2014-11-03 ENCOUNTER — Ambulatory Visit: Payer: Medicaid Other | Attending: Internal Medicine | Admitting: Internal Medicine

## 2014-11-03 ENCOUNTER — Encounter: Payer: Self-pay | Admitting: Internal Medicine

## 2014-11-03 VITALS — BP 128/70 | HR 103 | Temp 98.0°F | Resp 16 | Ht 64.0 in | Wt 178.4 lb

## 2014-11-03 DIAGNOSIS — Z3490 Encounter for supervision of normal pregnancy, unspecified, unspecified trimester: Secondary | ICD-10-CM

## 2014-11-03 DIAGNOSIS — Z87891 Personal history of nicotine dependence: Secondary | ICD-10-CM | POA: Insufficient documentation

## 2014-11-03 DIAGNOSIS — Z79899 Other long term (current) drug therapy: Secondary | ICD-10-CM | POA: Diagnosis not present

## 2014-11-03 DIAGNOSIS — F329 Major depressive disorder, single episode, unspecified: Secondary | ICD-10-CM | POA: Insufficient documentation

## 2014-11-03 DIAGNOSIS — N926 Irregular menstruation, unspecified: Secondary | ICD-10-CM

## 2014-11-03 DIAGNOSIS — Z3201 Encounter for pregnancy test, result positive: Secondary | ICD-10-CM | POA: Diagnosis not present

## 2014-11-03 DIAGNOSIS — Z331 Pregnant state, incidental: Secondary | ICD-10-CM

## 2014-11-03 LAB — POCT URINE PREGNANCY: Preg Test, Ur: POSITIVE — AB

## 2014-11-03 NOTE — Progress Notes (Signed)
Patient ID: Erin Ruiz, female   DOB: May 06, 1990, 24 y.o.   MRN: [id number]  CC: missed period   HPI: Erin Ruiz is a 24 y.o. female here today for a follow up visit.  Patient has past medical history of depression. Patient reports that she recently took a home pregnancy test that came back positive. She reports that she has irregular periods due to her continuing to breastfeed her 53 month old child. She estimates her LMP to be around 09/14/14 which was very light. She reports that she needs a letter confirming her pregnancy so that she can schedule a appointment with [facility].  Patient has No headache, No chest pain, No abdominal pain - No Nausea, No new weakness tingling or numbness, No Cough - SOB.  No Known Allergies Past Medical History  Diagnosis Date  . Heart murmur    Current Outpatient Prescriptions on File Prior to Visit  Medication Sig Dispense Refill  . b complex vitamins tablet Take 1 tablet by mouth daily.    . bacitracin 500 UNIT/GM ointment Apply 1 application topically 3 (three) times daily. 15 g 4  . cyclobenzaprine (FLEXERIL) 10 MG tablet Take 1 tablet (10 mg total) by mouth at bedtime as needed for muscle spasms. (Patient not taking: Reported on 08/23/2014) 30 tablet 0  . METROGEL VAGINAL 0.75 % vaginal gel Place 1 Applicatorful vaginally at bedtime. For 5 days. (Patient not taking: Reported on 03/03/2014) 70 g 0  . naproxen (NAPROSYN) 500 MG tablet Take 1 tablet (500 mg total) by mouth 2 (two) times daily with a meal. For back pain Do not use this medication in combination with ibuprofen. (Patient not taking: Reported on 05/10/2014) 30 tablet 0  . oxyCODONE-acetaminophen (PERCOCET/ROXICET) 5-325 MG per tablet Take 1 tablet by mouth every 4 (four) hours as needed for severe pain. (Patient not taking: Reported on 03/03/2014) 10 tablet 0  . Prenatal Vit-Fe Fumarate-FA (PRENATAL MULTIVITAMIN) TABS tablet Take 1 tablet by mouth daily at 12 noon.    .  sertraline (ZOLOFT) 50 MG tablet Take 1 tablet (50 mg total) by mouth daily. 30 tablet 4   No current facility-administered medications on file prior to visit.   Family History  Problem Relation Age of Onset  . Hypertension Mother   . Asthma Father    Social History   Social History  . Marital Status: Single    Spouse Name: N/A  . Number of Children: N/A  . Years of Education: N/A   Occupational History  . Not on file.   Social History Main Topics  . Smoking status: Former Games developer  . Smokeless tobacco: Not on file  . Alcohol Use: No  . Drug Use: No  . Sexual Activity:    Partners: Male    Birth Control/ Protection: None   Other Topics Concern  . Not on file   Social History Narrative    Review of Systems: Other than what is stated in HPI, all other systems are negative.   Objective:   Filed Vitals:   11/03/14 1636  BP: 128/70  Pulse: 103  Temp: 98 F (36.7 C)  Resp: 16    Physical Exam  Constitutional: No distress.  Cardiovascular: Normal rate, regular rhythm and normal heart sounds.   Pulmonary/Chest: Effort normal and breath sounds normal.  Psychiatric: She has a normal mood and affect.    Lab Results  Component Value Date   WBC 6.2 05/10/2014   HGB 13.7 05/10/2014   HCT 41.8  05/10/2014   MCV 89.5 05/10/2014   PLT 233 05/10/2014   No results found for: CREATININE, BUN, NA, K, CL, CO2  Lab Results  Component Value Date   HGBA1C 5.30 03/03/2014   Lipid Panel  No results found for: CHOL, TRIG, HDL, CHOLHDL, VLDL, LDLCALC     Assessment and plan:   [name] was seen today for follow-up.  Diagnoses and all orders for this visit:  Missed periods -     POCT urine pregnancy---positive  Pregnant -     Ambulatory referral to Obstetrics / Gynecology Will send referral to [facility]. Advised patient to stop taking Zoloft today and wait until she can be seen by her OB provider to determine if she should continue medication. She should  get OTC prenatal vitamins to take daily.      Return if symptoms worsen or fail to improve.    Ambrose Finland, NP-C [facility] [phone] 11/03/2014, 5:06 PM

## 2014-11-03 NOTE — Patient Instructions (Signed)
Stop Zoloft and wait until you see GYN for further directions Get prenatal vitamins

## 2014-11-03 NOTE — Progress Notes (Signed)
Patient states she is here for pregnancy test Patient needs proof of pregnancy so she can be scheduled with ob/gyn

## 2014-11-18 ENCOUNTER — Telehealth: Payer: Self-pay | Admitting: *Deleted

## 2014-11-18 NOTE — Telephone Encounter (Signed)
Patient has a NOB appointment and has a question about her medication- she has been using Zoloft since her last pregnancy with good results, but now feels she needs an increased dose. Is it safe to take and can she increase the dose if it is- she feels her emotions are everywhere. 2:36 Patient states she went to the pregnancy wellness center and the nurse told her to stop her Zoloft- she didn't do well so she restarted, but is not getting a good effect. She wants to increase her dose or try something safer. Told patient I would check with Rachelle and let her know.

## 2014-11-19 ENCOUNTER — Other Ambulatory Visit: Payer: Self-pay | Admitting: Certified Nurse Midwife

## 2014-11-19 NOTE — Telephone Encounter (Signed)
Please let her know not to stop the Zoloft cold [place].  Lets get her in with [facility].  Do we know how far along she is?  Here is my take on things: Pregnancy hormones were once thought to protect women from depression, but researchers now say this isn't true. In addition, pregnancy can trigger a range of emotions that make it more difficult to cope with depression.  Thus, depression treatment during pregnancy is essential to prevent missed prenatal appointments, weight loss, premature delivery and increased risk of c-section, and increased risk of postpartum depression.   zoloft is a safer option.  We should do a depression screening on her and see what dose she is currently on.    Thank you. Felisa Bonier CNM

## 2014-11-22 NOTE — Telephone Encounter (Signed)
2:30 Patient called in to get response- 4:48 LM on VM to CB

## 2014-11-23 ENCOUNTER — Other Ambulatory Visit: Payer: Self-pay | Admitting: Certified Nurse Midwife

## 2014-11-23 DIAGNOSIS — O99345 Other mental disorders complicating the puerperium: Secondary | ICD-10-CM

## 2014-11-23 DIAGNOSIS — F53 Postpartum depression: Principal | ICD-10-CM

## 2014-11-23 MED ORDER — SERTRALINE HCL 50 MG PO TABS: 50.0000 mg | ORAL_TABLET | Freq: Every day | ORAL | Status: DC

## 2014-11-23 NOTE — Telephone Encounter (Signed)
Thank you Erskine Squibb.  I have sent in Zoloft for her to the pharmacy.

## 2014-11-23 NOTE — Telephone Encounter (Signed)
Spoke to patient- She is currently using Zoloft 50 mg daily. She only stopped for 3-4 days before she resumed. She is willing to see [facility] and the appointment has been made. A copy of her depression screening was given to Ms. [name].She states her last cycle was about 09/14/2014 est. Reviewed comments with her and told her to continue her current regimen. She is OK for now - she does not have a strong support system and so she is happy to come here.

## 2014-11-30 ENCOUNTER — Encounter: Payer: Medicaid Other | Admitting: Certified Nurse Midwife

## 2014-12-01 ENCOUNTER — Encounter: Payer: Medicaid Other | Admitting: Certified Nurse Midwife

## 2014-12-02 ENCOUNTER — Ambulatory Visit (INDEPENDENT_AMBULATORY_CARE_PROVIDER_SITE_OTHER): Payer: Medicaid Other | Admitting: Certified Nurse Midwife

## 2014-12-02 ENCOUNTER — Encounter: Payer: Self-pay | Admitting: Certified Nurse Midwife

## 2014-12-02 VITALS — BP 113/70 | HR 80 | Temp 98.2°F | Wt 180.0 lb

## 2014-12-02 DIAGNOSIS — Z3481 Encounter for supervision of other normal pregnancy, first trimester: Secondary | ICD-10-CM | POA: Insufficient documentation

## 2014-12-02 HISTORY — DX: Encounter for supervision of other normal pregnancy, first trimester: Z34.81

## 2014-12-02 LAB — POCT URINALYSIS DIPSTICK
Bilirubin, UA: NEGATIVE
Glucose, UA: NEGATIVE
Ketones, UA: NEGATIVE
NITRITE UA: NEGATIVE
RBC UA: NEGATIVE
SPEC GRAV UA: 1.02
UROBILINOGEN UA: NEGATIVE
pH, UA: 5

## 2014-12-02 MED ORDER — TERCONAZOLE 0.4 % VA CREA: 1.0000 | TOPICAL_CREAM | Freq: Every day | VAGINAL | Status: DC

## 2014-12-02 MED ORDER — TINIDAZOLE 500 MG PO TABS: 2.0000 g | ORAL_TABLET | Freq: Every day | ORAL | Status: AC

## 2014-12-02 MED ORDER — SERTRALINE HCL 100 MG PO TABS: 100.0000 mg | ORAL_TABLET | Freq: Every day | ORAL | Status: DC

## 2014-12-02 NOTE — Progress Notes (Signed)
Subjective:    Erin Ruiz is being seen today for her first obstetrical visit.  This is not a planned pregnancy. She is at [redacted]w[redacted]d gestation. Her obstetrical history is significant for none, Paul. Relationship with FOB: significant other, living together. Patient does intend to breast feed, currently tandem nursing. Pregnancy history fully reviewed.  Stay at home mother.    The information documented in the HPI was reviewed and verified.  Menstrual History: OB History    Gravida Para Term Preterm AB TAB SAB Ectopic Multiple Living   Menarche age: 24 years of age  Patient's last menstrual period was 09/14/2014 (approximate).    Past Medical History  Diagnosis Date  . Heart murmur     childhood  . Depression     Past Surgical History  Procedure Laterality Date  . Tonsillectomy  2004     (Not in a hospital admission) No Known Allergies  Social History  Substance Use Topics  . Smoking status: Former Games developer  . Smokeless tobacco: Not on file  . Alcohol Use: No    Family History  Problem Relation Age of Onset  . Hypertension Mother   . Asthma Father      Review of Systems Constitutional: negative for weight loss Gastrointestinal: negative for vomiting, + nausea Genitourinary:negative for genital lesions and vaginal discharge and dysuria Musculoskeletal:negative for back pain Behavioral/Psych: negative for abusive relationship, depression, illegal drug usage and tobacco use    Objective:    BP 113/70 mmHg  Pulse 80  Temp(Src) 98.2 F (36.8 C)  Wt 180 lb (81.647 kg)  LMP 09/14/2014 (Approximate) General Appearance:    Alert, cooperative, no distress, appears stated age  Head:    Normocephalic, without obvious abnormality, atraumatic  Eyes:    PERRL, conjunctiva/corneas clear, EOM's intact, fundi    benign, both eyes  Ears:    Normal TM's and external ear canals, both ears  Nose:   Nares normal, septum midline, mucosa normal, no drainage    or  sinus tenderness  Throat:   Lips, mucosa, and tongue normal; teeth and gums normal  Neck:   Supple, symmetrical, trachea midline, no adenopathy;    thyroid:  no enlargement/tenderness/nodules; no carotid   bruit or JVD  Back:     Symmetric, no curvature, ROM normal, no CVA tenderness  Lungs:     Clear to auscultation bilaterally, respirations unlabored  Chest Wall:    No tenderness or deformity   Heart:    Regular rate and rhythm, S1 and S2 normal, no murmur, rub   or gallop  Breast Exam:    No tenderness, masses, or nipple abnormality  Abdomen:     Soft, non-tender, bowel sounds active all four quadrants,    no masses, no organomegaly  Genitalia:    Normal female without lesion, discharge or tenderness  Extremities:   Extremities normal, atraumatic, no cyanosis or edema  Pulses:   2+ and symmetric all extremities  Skin:   Skin color, texture, turgor normal, no rashes or lesions  Lymph nodes:   Cervical, supraclavicular, and axillary nodes normal  Neurologic:   CNII-XII intact, normal strength, sensation and reflexes    throughout         Cervix:   Long, thick, closed and posterior.  + Cervivitis, copious green vaginal discharge present, cervix with strawberry appearance.      Fetal heart activity seen on ultrasound.  Fundus below  symphysis pubis.   Lab Review Urine pregnancy test Labs reviewed yes Radiologic studies reviewed no Assessment:    Pregnancy at [redacted]w[redacted]d weeks   Unsure of LMP  Nausea in early pregnancy  Depression  Cervicitis  Plan:    [facility] for [facility] 4th.   Prenatal vitamins.  Counseling provided regarding continued use of seat belts, cessation of alcohol consumption, smoking or use of illicit drugs; infection precautions i.e., influenza/TDAP immunizations, toxoplasmosis,CMV, parvovirus, listeria and varicella; workplace safety, exercise during pregnancy; routine dental care, safe medications, sexual activity, hot tubs, saunas, pools, travel,  caffeine use, fish and methlymercury, potential toxins, hair treatments, varicose veins Weight gain recommendations per IOM guidelines reviewed: underweight/BMI< 18.5--> gain 28 - 40 lbs; normal weight/BMI 18.5 - 24.9--> gain 25 - 35 lbs; overweight/BMI 25 - 29.9--> gain 15 - 25 lbs; obese/BMI >30->gain  11 - 20 lbs Problem list reviewed and updated. FIRST/CF mutation testing/NIPT/QUAD SCREEN/fragile X/Ashkenazi Jewish population testing/Spinal muscular atrophy discussed: requested. Role of ultrasound in pregnancy discussed; fetal survey: requested. Amniocentesis discussed: not indicated. VBAC calculator score: VBAC consent form provided Meds ordered this encounter  Medications  . Prenat-Fe Carbonyl-FA-Omega 3 (ONE-A-DAY WOMENS PRENATAL 1) 28-0.8-235 MG CAPS    Sig: Take by mouth.   No orders of the defined types were placed in this encounter.    Follow up in 4 weeks. 50% of 30 min visit spent on counseling and coordination of care.

## 2014-12-02 NOTE — Addendum Note (Signed)
Addended by: Marya Landry D on: 12/02/2014 11:31 AM   Modules accepted: Orders

## 2014-12-02 NOTE — Progress Notes (Signed)
Patient wants to discuss medication and family history of twins.

## 2014-12-03 LAB — TSH: TSH: 1.512 u[IU]/mL (ref 0.350–4.500)

## 2014-12-03 LAB — OBSTETRIC PANEL
ANTIBODY SCREEN: NEGATIVE
Basophils Absolute: 0 10*3/uL (ref 0.0–0.1)
Basophils Relative: 0 % (ref 0–1)
EOS ABS: 0.1 10*3/uL (ref 0.0–0.7)
EOS PCT: 1 % (ref 0–5)
HCT: 41.3 % (ref 36.0–46.0)
Hemoglobin: 13.9 g/dL (ref 12.0–15.0)
Hepatitis B Surface Ag: NEGATIVE
LYMPHS ABS: 1.8 10*3/uL (ref 0.7–4.0)
Lymphocytes Relative: 28 % (ref 12–46)
MCH: 29.7 pg (ref 26.0–34.0)
MCHC: 33.7 g/dL (ref 30.0–36.0)
MCV: 88.2 fL (ref 78.0–100.0)
MPV: 9.1 fL (ref 8.6–12.4)
Monocytes Absolute: 0.5 10*3/uL (ref 0.1–1.0)
Monocytes Relative: 7 % (ref 3–12)
Neutro Abs: 4.2 10*3/uL (ref 1.7–7.7)
Neutrophils Relative %: 64 % (ref 43–77)
Platelets: 203 10*3/uL (ref 150–400)
RBC: 4.68 MIL/uL (ref 3.87–5.11)
RDW: 13.6 % (ref 11.5–15.5)
RH TYPE: POSITIVE
RUBELLA: 1.38 {index} — AB (ref <0.90)
WBC: 6.6 10*3/uL (ref 4.0–10.5)

## 2014-12-03 LAB — PAP IG W/ RFLX HPV ASCU

## 2014-12-03 LAB — CULTURE, OB URINE
Colony Count: NO GROWTH
Organism ID, Bacteria: NO GROWTH

## 2014-12-03 LAB — HIV ANTIBODY (ROUTINE TESTING W REFLEX): HIV: NONREACTIVE

## 2014-12-03 LAB — VARICELLA ZOSTER ANTIBODY, IGG: Varicella IgG: 969.6 Index — ABNORMAL HIGH (ref <135.00)

## 2014-12-03 LAB — VITAMIN D 25 HYDROXY (VIT D DEFICIENCY, FRACTURES): Vit D, 25-Hydroxy: 18 ng/mL — ABNORMAL LOW (ref 30–100)

## 2014-12-06 LAB — SURESWAB, VAGINOSIS/VAGINITIS PLUS
Atopobium vaginae: 7.3 Log (cells/mL)
C. albicans, DNA: NOT DETECTED
C. glabrata, DNA: NOT DETECTED
C. parapsilosis, DNA: NOT DETECTED
C. trachomatis RNA, TMA: NOT DETECTED
C. tropicalis, DNA: NOT DETECTED
Gardnerella vaginalis: >8 Log (cells/mL)
LACTOBACILLUS SPECIES: NOT DETECTED Log (cells/mL)
MEGASPHAERA SPECIES: 7.9 Log (cells/mL)
N. gonorrhoeae RNA, TMA: NOT DETECTED
T. vaginalis RNA, QL TMA: NOT DETECTED

## 2014-12-06 LAB — HEMOGLOBINOPATHY EVALUATION
HGB A2 QUANT: 2.5 % (ref 2.2–3.2)
HGB F QUANT: 0 % (ref 0.0–2.0)
HGB S QUANTITAION: 0 %
Hemoglobin Other: 0 %
Hgb A: 97.5 % (ref 96.8–97.8)

## 2014-12-07 ENCOUNTER — Other Ambulatory Visit: Payer: Self-pay | Admitting: Certified Nurse Midwife

## 2014-12-07 DIAGNOSIS — B9689 Other specified bacterial agents as the cause of diseases classified elsewhere: Secondary | ICD-10-CM

## 2014-12-07 DIAGNOSIS — N760 Acute vaginitis: Principal | ICD-10-CM

## 2014-12-07 MED ORDER — METRONIDAZOLE 0.75 % VA GEL: 1.0000 | Freq: Two times a day (BID) | VAGINAL | Status: DC

## 2014-12-09 ENCOUNTER — Ambulatory Visit (INDEPENDENT_AMBULATORY_CARE_PROVIDER_SITE_OTHER): Payer: Medicaid Other

## 2014-12-09 DIAGNOSIS — Z3687 Encounter for antenatal screening for uncertain dates: Secondary | ICD-10-CM

## 2014-12-09 DIAGNOSIS — Z36 Encounter for antenatal screening of mother: Secondary | ICD-10-CM

## 2014-12-29 ENCOUNTER — Ambulatory Visit (INDEPENDENT_AMBULATORY_CARE_PROVIDER_SITE_OTHER): Payer: Medicaid Other | Admitting: Certified Nurse Midwife

## 2014-12-29 VITALS — BP 114/64 | HR 76 | Temp 97.8°F | Wt 183.0 lb

## 2014-12-29 DIAGNOSIS — Z3482 Encounter for supervision of other normal pregnancy, second trimester: Secondary | ICD-10-CM

## 2014-12-29 LAB — POCT URINALYSIS DIPSTICK
Bilirubin, UA: NEGATIVE
Blood, UA: NEGATIVE
GLUCOSE UA: NEGATIVE
Ketones, UA: NEGATIVE
Leukocytes, UA: NEGATIVE
NITRITE UA: NEGATIVE
PH UA: 7
Protein, UA: NEGATIVE
SPEC GRAV UA: 1.01
UROBILINOGEN UA: NEGATIVE

## 2014-12-29 MED ORDER — CITRANATAL HARMONY 30-1-260 MG PO CAPS: 1.0000 | ORAL_CAPSULE | Freq: Every day | ORAL | Status: DC
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
## 2015-01-03 LAB — AFP, QUAD SCREEN
AFP: 25.7 ng/mL
Age Alone: 1:1100 {titer}
CURR GEST AGE: 15 wks.days
HCG TOTAL: 40.46 [IU]/mL
INH: 114.6 pg/mL
INTERPRETATION-AFP: NEGATIVE
MOM FOR HCG: 0.84
MOM FOR INH: 0.67
MoM for AFP: 0.85
Open Spina bifida: NEGATIVE
Osb Risk: 1:44900 {titer}
Tri 18 Scr Risk Est: NEGATIVE
Trisomy 18 (Edward) Syndrome Interp.: 1:43800 {titer}
UE3 MOM: 0.92
uE3 Value: 0.56 ng/mL

## 2015-01-20 ENCOUNTER — Ambulatory Visit (INDEPENDENT_AMBULATORY_CARE_PROVIDER_SITE_OTHER): Payer: Medicaid Other

## 2015-01-20 ENCOUNTER — Ambulatory Visit (INDEPENDENT_AMBULATORY_CARE_PROVIDER_SITE_OTHER): Payer: Medicaid Other | Admitting: Certified Nurse Midwife

## 2015-01-20 VITALS — BP 124/73 | HR 101 | Wt 188.0 lb

## 2015-01-20 DIAGNOSIS — Z3482 Encounter for supervision of other normal pregnancy, second trimester: Secondary | ICD-10-CM

## 2015-01-20 DIAGNOSIS — Z36 Encounter for antenatal screening of mother: Secondary | ICD-10-CM | POA: Diagnosis not present

## 2015-01-20 DIAGNOSIS — Z3481 Encounter for supervision of other normal pregnancy, first trimester: Secondary | ICD-10-CM

## 2015-01-20 LAB — POCT URINALYSIS DIPSTICK
BILIRUBIN UA: NEGATIVE
GLUCOSE UA: NEGATIVE
Ketones, UA: NEGATIVE
Leukocytes, UA: NEGATIVE
Nitrite, UA: NEGATIVE
Protein, UA: NEGATIVE
RBC UA: NEGATIVE
SPEC GRAV UA: 1.015
UROBILINOGEN UA: NEGATIVE
pH, UA: 6

## 2015-01-20 MED ORDER — CITRANATAL HARMONY 30-1-260 MG PO CAPS: 1.0000 | ORAL_CAPSULE | Freq: Every day | ORAL | Status: DC

## 2015-01-20 NOTE — Progress Notes (Signed)
Subjective:    Erin Ruiz is a 24 y.o. female being seen today for her obstetrical visit. She is at 3435w2d gestation. Patient reports: backache, no bleeding, no contractions, no cramping and no leaking.  Discussed ways to treat lumbar back pain, recently moved and was doing a lot of lifting.  Fetal movement: normal.  Problem List Items Addressed This Visit      Other   Encounter for supervision of other normal pregnancy in first trimester - Primary   Relevant Orders   POCT urinalysis dipstick (Completed)     Patient Active Problem List   Diagnosis Date Noted  . Encounter for supervision of other normal pregnancy in first trimester 12/02/2014   Objective:    BP 124/73 mmHg  Pulse 101  Wt 188 lb (85.276 kg)  LMP 09/14/2014 (Approximate) FHT: 150 BPM  Uterine Size: size equals dates     Assessment:    Pregnancy @ 4035w2d    Lumbar back pain Plan:    OBGCT: discussed. Signs and symptoms of preterm labor: discussed.  Labs, problem list reviewed and updated 2 hr GTT planned Follow up in 4 weeks.

## 2015-01-24 ENCOUNTER — Other Ambulatory Visit: Payer: Self-pay | Admitting: Certified Nurse Midwife

## 2015-01-26 ENCOUNTER — Encounter (HOSPITAL_COMMUNITY): Payer: Self-pay | Admitting: Emergency Medicine

## 2015-01-26 ENCOUNTER — Emergency Department (HOSPITAL_COMMUNITY)
Admission: EM | Admit: 2015-01-26 | Discharge: 2015-01-26 | Disposition: A | Discharge status: Home / Self Care | Payer: Medicaid Other | Source: Home / Self Care | Attending: Family Medicine | Admitting: Family Medicine

## 2015-01-26 DIAGNOSIS — J069 Acute upper respiratory infection, unspecified: Secondary | ICD-10-CM

## 2015-01-26 MED ORDER — AZITHROMYCIN 250 MG PO TABS: ORAL_TABLET | ORAL | Status: DC

## 2015-01-26 NOTE — Discharge Instructions (Signed)
Drink plenty of fluids as discussed, use medicine as prescribed, and mucinex or delsym for cough. Return or see your doctor if further problems °

## 2015-01-26 NOTE — ED Notes (Signed)
Cough and sore throat, onset Saturday.  Patient is pregnant

## 2015-01-26 NOTE — ED Provider Notes (Signed)
CSN: [id number]     Arrival date & time 01/26/15  1610 History   First MD Initiated Contact with Patient 01/26/15 1712     No chief complaint on file.  (Consider location/radiation/quality/duration/timing/severity/associated sxs/prior Treatment) Patient is a 24 y.o. female presenting with URI. The history is provided by the patient.  URI Presenting symptoms: congestion, cough and rhinorrhea   Presenting symptoms: no fatigue, no fever and no sore throat   Severity:  Mild Onset quality:  Gradual Duration:  5 days Timing:  Constant Progression:  Unchanged Chronicity:  New Relieved by:  OTC medications Associated symptoms: no headaches and no wheezing   Risk factors comment:  Pt is pregnant.   Past Medical History  Diagnosis Date  . Heart murmur     childhood  . Depression    Past Surgical History  Procedure Laterality Date  . Tonsillectomy  2004   Family History  Problem Relation Age of Onset  . Hypertension Mother   . Asthma Father    Social History  Substance Use Topics  . Smoking status: Former Games developermoker  . Smokeless tobacco: Not on file  . Alcohol Use: No   OB History    Gravida Para Term Preterm AB TAB SAB Ectopic Multiple Living   3 2 2       2      Review of Systems  Constitutional: Negative.  Negative for fever and fatigue.  HENT: Positive for congestion, postnasal drip and rhinorrhea. Negative for sore throat.   Respiratory: Positive for cough. Negative for shortness of breath and wheezing.   Cardiovascular: Negative for chest pain and palpitations.  Gastrointestinal: Negative.   Genitourinary: Negative.   Neurological: Negative for headaches.  All other systems reviewed and are negative.   Allergies  Review of patient's allergies indicates no known allergies.  Home Medications   Prior to Admission medications   Medication Sig Start Date End Date Taking? Authorizing Provider  azithromycin (ZITHROMAX Z-PAK) 250 MG tablet Take as directed on pack  01/26/15   Linna HoffJames D Donaciano Range, MD  b complex vitamins tablet Take 1 tablet by mouth daily.    Historical Provider, MD  bacitracin 500 UNIT/GM ointment Apply 1 application topically 3 (three) times daily. Patient not taking: Reported on 12/02/2014 08/23/14   Ambrose FinlandValerie A Keck, NP  cyclobenzaprine (FLEXERIL) 10 MG tablet Take 1 tablet (10 mg total) by mouth at bedtime as needed for muscle spasms. Patient not taking: Reported on 08/23/2014 05/10/14   Ambrose FinlandValerie A Keck, NP  metroNIDAZOLE (METROGEL VAGINAL) 0.75 % vaginal gel Place 1 Applicatorful vaginally 2 (two) times daily. 12/07/14   Rachelle A Denney, CNM  naproxen (NAPROSYN) 500 MG tablet Take 1 tablet (500 mg total) by mouth 2 (two) times daily with a meal. For back pain Do not use this medication in combination with ibuprofen. Patient not taking: Reported on 05/10/2014 02/28/14   Ria ClockJennifer Lee H Presson, PA  oxyCODONE-acetaminophen (PERCOCET/ROXICET) 5-325 MG per tablet Take 1 tablet by mouth every 4 (four) hours as needed for severe pain. Patient not taking: Reported on 03/03/2014 02/14/14   Oswaldo ConroyVictoria Creech, PA-C  Prenat w/o A-FeCbn-DSS-FA-DHA (CITRANATAL HARMONY) 30-1-260 MG CAPS Take 1 tablet by mouth daily. 01/20/15   Rachelle A Denney, CNM  Prenat-Fe Carbonyl-FA-Omega 3 (ONE-A-DAY WOMENS PRENATAL 1) 28-0.8-235 MG CAPS Take by mouth.    Historical Provider, MD  sertraline (ZOLOFT) 100 MG tablet Take 1 tablet (100 mg total) by mouth daily. 12/02/14   Rachelle A Denney, CNM  terconazole (TERAZOL 7) 0.4 %  vaginal cream Place 1 applicator vaginally at bedtime. 12/02/14   Roe Coombs, CNM   Meds Ordered and Administered this Visit  Medications - No data to display  LMP 09/14/2014 (Approximate) No data found.   Physical Exam  Constitutional: She is oriented to person, place, and time. She appears well-developed and well-nourished.  HENT:  Right Ear: External ear normal.  Left Ear: External ear normal.  Mouth/Throat: Oropharynx is clear and moist.   Eyes: Conjunctivae are normal. Pupils are equal, round, and reactive to light.  Neck: Normal range of motion. Neck supple.  Cardiovascular: Normal heart sounds and intact distal pulses.   Pulmonary/Chest: Effort normal and breath sounds normal.  Neurological: She is alert and oriented to person, place, and time.  Skin: Skin is warm and dry.  Nursing note and vitals reviewed.   ED Course  Procedures (including critical care time)  Labs Review Labs Reviewed - No data to display  Imaging Review No results found.   Visual Acuity Review  Right Eye Distance:   Left Eye Distance:   Bilateral Distance:    Right Eye Near:   Left Eye Near:    Bilateral Near:         MDM   1. URI (upper respiratory infection)        Linna Hoff, MD 01/26/15 [phone]

## 2015-02-04 ENCOUNTER — Other Ambulatory Visit: Payer: Self-pay | Admitting: Certified Nurse Midwife

## 2015-02-07 ENCOUNTER — Telehealth: Payer: Self-pay | Admitting: *Deleted

## 2015-02-07 ENCOUNTER — Other Ambulatory Visit: Payer: Self-pay | Admitting: Certified Nurse Midwife

## 2015-02-07 DIAGNOSIS — Z3492 Encounter for supervision of normal pregnancy, unspecified, second trimester: Secondary | ICD-10-CM

## 2015-02-07 MED ORDER — VITAFOL ULTRA 29-0.6-0.4-200 MG PO CAPS
1.0000 | ORAL_CAPSULE | Freq: Every day | ORAL | Status: DC
Start: 2015-02-07 — End: 2015-02-19

## 2015-02-07 NOTE — Telephone Encounter (Signed)
Patient notified

## 2015-02-07 NOTE — Telephone Encounter (Signed)
Patient is concerned that on the last US - the face was not visualized and she feels that she needs a follw up for that. She also states the PNV that were prescribed are not covered by her insurance and she wants a different RX.

## 2015-02-07 NOTE — Telephone Encounter (Signed)
Please let her know that her fetal anatomy scan was normal, I can try for a limited f/u ultrasound around 30 weeks for her.  Also, please tell her I am sorry that Medicaid is apparently not paying for the Citranatal PNV anymore and that I have sent Vitafol ultra to the pharmacy for her.  Thank you.  R.Pio Eatherly CNM

## 2015-02-16 ENCOUNTER — Ambulatory Visit (INDEPENDENT_AMBULATORY_CARE_PROVIDER_SITE_OTHER): Payer: Medicaid Other | Admitting: Certified Nurse Midwife

## 2015-02-16 VITALS — BP 114/64 | HR 92 | Temp 97.3°F | Wt 198.0 lb

## 2015-02-16 DIAGNOSIS — Z3482 Encounter for supervision of other normal pregnancy, second trimester: Secondary | ICD-10-CM

## 2015-02-16 LAB — POCT URINALYSIS DIPSTICK
BILIRUBIN UA: NEGATIVE
Glucose, UA: NEGATIVE
KETONES UA: NEGATIVE
Leukocytes, UA: NEGATIVE
NITRITE UA: NEGATIVE
PROTEIN UA: NEGATIVE
RBC UA: NEGATIVE
Spec Grav, UA: <=1.005
Urobilinogen, UA: NEGATIVE
pH, UA: 7

## 2015-02-16 NOTE — Patient Instructions (Addendum)

## 2015-02-16 NOTE — Progress Notes (Signed)
Subjective:    Eino FarberMerrisa Cumpston is a 24 y.o. female being seen today for her obstetrical visit. She is at 7650w1d gestation. Patient reports: no complaints . Fetal movement: normal.  Did have a URI recently, is over it now.  Declines flu vaccine today.    Problem List Items Addressed This Visit    None    Visit Diagnoses    Supervision of other normal pregnancy, antepartum, second trimester    -  Primary    Relevant Orders    POCT urinalysis dipstick      Patient Active Problem List   Diagnosis Date Noted  . Encounter for supervision of other normal pregnancy in first trimester 12/02/2014   Objective:    BP 114/64 mmHg  Pulse 92  Temp(Src) 97.3 F (36.3 C)  Wt 198 lb (89.812 kg)  LMP 09/14/2014 (Approximate) FHT: 150 BPM  Uterine Size: size equals dates     Assessment:    Pregnancy @ 5050w1d    Doing well.   Plan:    OBGCT: discussed. Signs and symptoms of preterm labor: discussed.  Labs, problem list reviewed and updated 2 hr GTT planned Follow up in 4 weeks.

## 2015-02-19 ENCOUNTER — Inpatient Hospital Stay (HOSPITAL_COMMUNITY)
Admission: AD | Admit: 2015-02-19 | Discharge: 2015-02-20 | Disposition: A | Discharge status: Home / Self Care | Payer: Medicaid Other | Source: Ambulatory Visit | Attending: Obstetrics | Admitting: Obstetrics

## 2015-02-19 ENCOUNTER — Encounter (HOSPITAL_COMMUNITY): Payer: Self-pay | Admitting: *Deleted

## 2015-02-19 DIAGNOSIS — R51 Headache: Secondary | ICD-10-CM | POA: Diagnosis not present

## 2015-02-19 DIAGNOSIS — Z3A22 22 weeks gestation of pregnancy: Secondary | ICD-10-CM | POA: Insufficient documentation

## 2015-02-19 DIAGNOSIS — Z87891 Personal history of nicotine dependence: Secondary | ICD-10-CM | POA: Diagnosis not present

## 2015-02-19 DIAGNOSIS — O26892 Other specified pregnancy related conditions, second trimester: Secondary | ICD-10-CM | POA: Diagnosis not present

## 2015-02-19 LAB — URINALYSIS, ROUTINE W REFLEX MICROSCOPIC
Bilirubin Urine: NEGATIVE
GLUCOSE, UA: NEGATIVE mg/dL
KETONES UR: NEGATIVE mg/dL
LEUKOCYTES UA: NEGATIVE
Nitrite: NEGATIVE
PH: 6.5 (ref 5.0–8.0)
Protein, ur: NEGATIVE mg/dL

## 2015-02-19 LAB — URINE MICROSCOPIC-ADD ON

## 2015-02-19 MED ORDER — METOCLOPRAMIDE HCL 5 MG/ML IJ SOLN
10.0000 mg | Freq: Once | INTRAMUSCULAR | Status: AC
  Administered 2015-02-19: 10 mg via INTRAVENOUS
  Filled 2015-02-19: qty 2

## 2015-02-19 MED ORDER — CYCLOBENZAPRINE HCL 10 MG PO TABS
10.0000 mg | ORAL_TABLET | Freq: Once | ORAL | Status: AC
  Administered 2015-02-19: 10 mg via ORAL
  Filled 2015-02-19: qty 1

## 2015-02-19 MED ORDER — DEXAMETHASONE SODIUM PHOSPHATE 10 MG/ML IJ SOLN
10.0000 mg | Freq: Once | INTRAMUSCULAR | Status: AC
  Administered 2015-02-19: 10 mg via INTRAVENOUS
  Filled 2015-02-19: qty 1

## 2015-02-19 MED ORDER — IBUPROFEN 800 MG PO TABS
800.0000 mg | ORAL_TABLET | Freq: Once | ORAL | Status: AC
  Administered 2015-02-19: 800 mg via ORAL
  Filled 2015-02-19: qty 1

## 2015-02-19 MED ORDER — DIPHENHYDRAMINE HCL 50 MG/ML IJ SOLN
25.0000 mg | Freq: Once | INTRAMUSCULAR | Status: AC
  Administered 2015-02-19: 25 mg via INTRAVENOUS
  Filled 2015-02-19: qty 1

## 2015-02-19 MED ORDER — LACTATED RINGERS IV BOLUS (SEPSIS)
1000.0000 mL | Freq: Once | INTRAVENOUS | Status: AC
  Administered 2015-02-19: 1000 mL via INTRAVENOUS

## 2015-02-19 NOTE — MAU Provider Note (Signed)
History     CSN: [id number]  Arrival date and time: 02/19/15 2027   None     No chief complaint on file.  HPI 24 y.o. [id number] w/ headache x 4 days. Was seen in office for regular prenatal visit on first day of headache, but did not mention it at the time. Patient states she has been taking Tylenol with no relief, last took 500 mg Tylenol at [employer]6PM tonight. No vision changes, occasional nausea, no vomiting. + fetal movement. Patient states she has a h/o frequent headaches which usually improve w/ Tylenol, but Motrin normally works better for her. Patient states she has had 5 bottles of water to drink today, has been eating regularly, last had a salad at 6PM. No other medical issues.   Past Medical History  Diagnosis Date  . Heart murmur     childhood  . Depression     Past Surgical History  Procedure Laterality Date  . Tonsillectomy  2004    Family History  Problem Relation Age of Onset  . Hypertension Mother   . Asthma Father     Social History  Substance Use Topics  . Smoking status: Former Games developermoker  . Smokeless tobacco: Not on file  . Alcohol Use: No    Allergies: No Known Allergies  Prescriptions prior to admission  Medication Sig Dispense Refill Last Dose  . Prenat-Fe Poly-Methfol-FA-DHA (VITAFOL ULTRA) 29-0.6-0.4-200 MG CAPS Take 1 tablet by mouth daily at 10 pm. 30 capsule 12 Taking  . sertraline (ZOLOFT) 100 MG tablet Take 1 tablet (100 mg total) by mouth daily. 30 tablet 12 Taking    Review of Systems  Constitutional: Negative.   Respiratory: Negative.   Cardiovascular: Negative.   Gastrointestinal: Positive for nausea. Negative for vomiting, abdominal pain, diarrhea and constipation.  Genitourinary: Negative for dysuria, urgency, frequency, hematuria and flank pain.       Negative for vaginal bleeding, cramping/contractions  Musculoskeletal: Negative.   Neurological: Positive for headaches.  Psychiatric/Behavioral: Negative.    Physical Exam   Last  menstrual period 09/14/2014, currently breastfeeding.  Physical Exam  Nursing note and vitals reviewed. Constitutional: She is oriented to person, place, and time. She appears well-developed and well-nourished. No distress.  Musculoskeletal: Normal range of motion.  Neurological: She is alert and oriented to person, place, and time. No cranial nerve deficit.  Skin: Skin is warm and dry.  Psychiatric: She has a normal mood and affect.    MAU Course  Procedures No results found for this or any previous visit (from the past 24 hour(s)).  - Motrin 800 mg PO for headache, no improvement after about 30 minutes.  - Patient reports much of the pain is in back of head from neck, has tightness and popping in neck often. Was previously rx'd flexeril, will try Flexeril 10 mg PO.  - Some relief w/ Flexeril, but not complete, will try IV fluids, Decadron 10 mg, Benadryl 25 mg, Reglan 10 mg IV.  - Headache improved w/ IV migraine cocktail  Assessment and Plan   1. Headache in pregnancy, antepartum, second trimester   Rx Fioricet, follow up as scheduled or w/ worsening symptoms    Medication List    STOP taking these medications        acetaminophen 500 MG tablet  Commonly known as:  TYLENOL      TAKE these medications        butalbital-acetaminophen-caffeine 50-325-40 MG tablet  Commonly known as:  FIORICET, ESGIC  Take 1 tablet  by mouth 2 (two) times daily as needed for headache.     sertraline 100 MG tablet  Commonly known as:  ZOLOFT  Take 1 tablet (100 mg total) by mouth daily.     VITAFOL ULTRA 29-0.6-0.4-200 MG Caps  Take 1 capsule by mouth at bedtime.            Follow-up Information    Follow up with [name],[name], MD.   Specialty:  Obstetrics and Gynecology   Why:  as scheduled or sooner as needed   Contact information:   [address] [place] [address] [phone]         [facility] 02/19/2015, 8:40 PM

## 2015-02-19 NOTE — MAU Note (Signed)
I have had the same headache since [facility]. Had appt [facility] but forgot to tell them. Tylenol not helping. Have had hx headaches since high school and Tylenol usually helps but not helping past few days. Denies LOF or bleeding

## 2015-02-20 DIAGNOSIS — O26892 Other specified pregnancy related conditions, second trimester: Secondary | ICD-10-CM

## 2015-02-20 DIAGNOSIS — R51 Headache: Secondary | ICD-10-CM

## 2015-02-20 MED ORDER — BUTALBITAL-APAP-CAFFEINE 50-325-40 MG PO TABS: 1.0000 | ORAL_TABLET | Freq: Two times a day (BID) | ORAL | Status: DC | PRN

## 2015-02-21 ENCOUNTER — Encounter: Payer: Self-pay | Admitting: Obstetrics

## 2015-02-21 ENCOUNTER — Ambulatory Visit (INDEPENDENT_AMBULATORY_CARE_PROVIDER_SITE_OTHER): Payer: Medicaid Other | Admitting: Obstetrics

## 2015-02-21 VITALS — BP 119/70 | HR 98 | Temp 98.0°F | Wt 201.0 lb

## 2015-02-21 DIAGNOSIS — Z3482 Encounter for supervision of other normal pregnancy, second trimester: Secondary | ICD-10-CM

## 2015-02-21 LAB — POCT URINALYSIS DIPSTICK
Bilirubin, UA: NEGATIVE
Glucose, UA: NEGATIVE
KETONES UA: NEGATIVE
Leukocytes, UA: NEGATIVE
Nitrite, UA: NEGATIVE
PROTEIN UA: NEGATIVE
RBC UA: NEGATIVE
Urobilinogen, UA: NEGATIVE
pH, UA: 7

## 2015-02-21 LAB — CULTURE, OB URINE: SPECIAL REQUESTS: NORMAL

## 2015-02-21 MED ORDER — BUTALBITAL-APAP-CAFFEINE 50-325-40 MG PO TABS: 2.0000 | ORAL_TABLET | Freq: Four times a day (QID) | ORAL | Status: DC | PRN

## 2015-02-21 NOTE — Progress Notes (Signed)
Subjective:    Erin FarberMerrisa Ruiz is a 24 y.o. female being seen today for her obstetrical visit. She is at 4761w6d gestation. Patient reports: headache . Fetal movement: normal.  Problem List Items Addressed This Visit    None    Visit Diagnoses    Encounter for supervision of other normal pregnancy in second trimester    -  Primary    Relevant Orders    POCT urinalysis dipstick (Completed)      Patient Active Problem List   Diagnosis Date Noted  . Encounter for supervision of other normal pregnancy in first trimester 12/02/2014   Objective:    BP 119/70 mmHg  Pulse 98  Temp(Src) 98 F (36.7 C)  Wt 201 lb (91.173 kg)  LMP 09/14/2014 (Approximate) FHT: 150 BPM  Uterine Size: size equals dates     Assessment:    Pregnancy @ 5961w6d    Plan:    Signs and symptoms of preterm labor: discussed.  Labs, problem list reviewed and updated 2 hr GTT planned Follow up in 4 weeks.

## 2015-03-01 ENCOUNTER — Telehealth: Payer: Self-pay | Admitting: Certified Nurse Midwife

## 2015-03-01 NOTE — Telephone Encounter (Signed)
Pt stated to continued with headache med is not working and not helping, pt refused to go to the hospital. Pt will like Erin Ruiz to call her back also her depression is not getting better.   [name]

## 2015-03-02 ENCOUNTER — Ambulatory Visit (INDEPENDENT_AMBULATORY_CARE_PROVIDER_SITE_OTHER): Payer: Medicaid Other | Admitting: Obstetrics

## 2015-03-02 ENCOUNTER — Encounter: Payer: Self-pay | Admitting: Obstetrics

## 2015-03-02 VITALS — BP 130/77 | HR 106 | Temp 98.1°F | Wt 200.0 lb

## 2015-03-02 DIAGNOSIS — G43101 Migraine with aura, not intractable, with status migrainosus: Secondary | ICD-10-CM

## 2015-03-02 DIAGNOSIS — Z3490 Encounter for supervision of normal pregnancy, unspecified, unspecified trimester: Secondary | ICD-10-CM

## 2015-03-02 LAB — POCT URINALYSIS DIPSTICK
BILIRUBIN UA: NEGATIVE
GLUCOSE UA: NEGATIVE
Ketones, UA: NEGATIVE
Leukocytes, UA: NEGATIVE
NITRITE UA: NEGATIVE
Protein, UA: NEGATIVE
RBC UA: NEGATIVE
Spec Grav, UA: 1.015
Urobilinogen, UA: NEGATIVE
pH, UA: 6

## 2015-03-02 MED ORDER — SUMATRIPTAN SUCCINATE 50 MG PO TABS: 50.0000 mg | ORAL_TABLET | ORAL | Status: DC | PRN

## 2015-03-02 NOTE — Progress Notes (Signed)
Subjective:    Erin FarberMerrisa Ruiz is a 24 y.o. female being seen today for her obstetrical visit. She is at 6814w1d gestation. Patient reports: headache . Fetal movement: normal.  Problem List Items Addressed This Visit    None    Visit Diagnoses    Migraine with aura and with status migrainosus, not intractable    -  Primary    Relevant Medications    SUMAtriptan (IMITREX) 50 MG tablet    Prenatal care, unspecified trimester        Relevant Orders    POCT urinalysis dipstick (Completed)      Patient Active Problem List   Diagnosis Date Noted  . Encounter for supervision of other normal pregnancy in first trimester 12/02/2014   Objective:    BP 130/77 mmHg  Pulse 106  Temp(Src) 98.1 F (36.7 C)  Wt 200 lb (90.719 kg)  LMP 09/14/2014 (Approximate) FHT: 150 BPM  Uterine Size: size equals dates     Assessment:    Pregnancy @ 5614w1d     Migraine HA's  Plan:    Imitrex Rx  Signs and symptoms of preterm labor: discussed.  Labs, problem list reviewed and updated 2 hr GTT planned Follow up in 2 weeks.

## 2015-03-02 NOTE — Progress Notes (Signed)
Patient is having migraines that she can't get to go away. Patient also thinks that her depression is getting worse because of the headache.

## 2015-03-03 ENCOUNTER — Inpatient Hospital Stay (HOSPITAL_COMMUNITY)
Admission: AD | Admit: 2015-03-03 | Discharge: 2015-03-03 | Disposition: A | Discharge status: Home / Self Care | Payer: Medicaid Other | Source: Ambulatory Visit | Attending: Obstetrics | Admitting: Obstetrics

## 2015-03-03 ENCOUNTER — Encounter (HOSPITAL_COMMUNITY): Payer: Self-pay | Admitting: *Deleted

## 2015-03-03 DIAGNOSIS — R109 Unspecified abdominal pain: Secondary | ICD-10-CM | POA: Diagnosis not present

## 2015-03-03 DIAGNOSIS — R300 Dysuria: Secondary | ICD-10-CM | POA: Insufficient documentation

## 2015-03-03 DIAGNOSIS — F329 Major depressive disorder, single episode, unspecified: Secondary | ICD-10-CM | POA: Insufficient documentation

## 2015-03-03 DIAGNOSIS — M549 Dorsalgia, unspecified: Secondary | ICD-10-CM

## 2015-03-03 DIAGNOSIS — Z3A24 24 weeks gestation of pregnancy: Secondary | ICD-10-CM | POA: Insufficient documentation

## 2015-03-03 DIAGNOSIS — O9989 Other specified diseases and conditions complicating pregnancy, childbirth and the puerperium: Secondary | ICD-10-CM | POA: Insufficient documentation

## 2015-03-03 DIAGNOSIS — Z87891 Personal history of nicotine dependence: Secondary | ICD-10-CM | POA: Diagnosis not present

## 2015-03-03 DIAGNOSIS — O99891 Other specified diseases and conditions complicating pregnancy: Secondary | ICD-10-CM

## 2015-03-03 DIAGNOSIS — O26899 Other specified pregnancy related conditions, unspecified trimester: Secondary | ICD-10-CM

## 2015-03-03 LAB — URINALYSIS, ROUTINE W REFLEX MICROSCOPIC
BILIRUBIN URINE: NEGATIVE
Glucose, UA: NEGATIVE mg/dL
Hgb urine dipstick: NEGATIVE
KETONES UR: NEGATIVE mg/dL
NITRITE: NEGATIVE
PROTEIN: NEGATIVE mg/dL
Specific Gravity, Urine: <1.005 — ABNORMAL LOW (ref 1.005–1.030)
pH: 6 (ref 5.0–8.0)

## 2015-03-03 LAB — URINE MICROSCOPIC-ADD ON: RBC / HPF: NONE SEEN RBC/hpf (ref 0–5)

## 2015-03-03 NOTE — Telephone Encounter (Signed)
Have patient make an appointment so that we can adjust her medications. Thank you.  R.Kevionna Heffler CNM

## 2015-03-03 NOTE — MAU Provider Note (Signed)
History     CSN: [id number]  Arrival date and time: 03/03/15 5621   First Provider Initiated Contact with Patient 03/03/15 1003      Chief Complaint  Patient presents with  . pelvic pressure   . Tailbone Pain   HPI Erin Ruiz 24 y.o. [id number]  presents to MAU complaining of back pain and back pressure.  She has also had some sharp pains in her abdomen that she thought she had gas.  She has had a bowel movement that was normal. She feels good fetal movement.  She denies LOF, vaginal bleeding.  She had dysuria upon giving urine sample here.  She is taking care of her 23 year old and 57month old daughter.  She was seen by her midwife yesterday and given rx for imitrex for migraine.  Imitrex was effective.  She has not used other medications to relieve her symptoms.  No ice or heat used.  No aggravating or alleviating factors noted.       OB History    Gravida Para Term Preterm AB TAB SAB Ectopic Multiple Living   Past Medical History  Diagnosis Date  . Heart murmur     childhood  . Depression     Past Surgical History  Procedure Laterality Date  . Tonsillectomy  2004    Family History  Problem Relation Age of Onset  . Hypertension Mother   . Asthma Father     Social History  Substance Use Topics  . Smoking status: Former Games developer  . Smokeless tobacco: None  . Alcohol Use: No    Allergies: No Known Allergies  Prescriptions prior to admission  Medication Sig Dispense Refill Last Dose  . butalbital-acetaminophen-caffeine (FIORICET, ESGIC) 50-325-40 MG tablet Take 2 tablets by mouth every 6 (six) hours as needed for headache. 40 tablet 0 Taking  . Prenat-Fe Poly-Methfol-FA-DHA (VITAFOL ULTRA) 29-0.6-0.4-200 MG CAPS Take 1 capsule by mouth at bedtime.   Taking  . sertraline (ZOLOFT) 100 MG tablet Take 1 tablet (100 mg total) by mouth daily. 30 tablet 12 Taking  . SUMAtriptan (IMITREX) 50 MG tablet Take 1 tablet (50 mg total) by mouth every 2  (two) hours as needed for migraine. May repeat in 2 hours if headache persists or recurs. 10 tablet prn     ROS Pertinent ROS in HPI.  All other systems are negative.   Physical Exam   Blood pressure 134/63, pulse 104, temperature 97.8 F (36.6 C), resp. rate 16, last menstrual period 09/14/2014, currently breastfeeding.  Physical Exam  Constitutional: She is oriented to person, place, and time. She appears well-developed and well-nourished. No distress.  HENT:  Head: Normocephalic and atraumatic.  Eyes: Conjunctivae and EOM are normal.  Neck: Normal range of motion. Neck supple.  Cardiovascular: Normal rate and normal heart sounds.   Respiratory: Effort normal and breath sounds normal. No respiratory distress.  GI: Soft. Bowel sounds are normal. She exhibits no distension. There is no tenderness. There is no rebound and no guarding.  Genitourinary:  External genitalia dry without evidence of bleeding or discharge.  Cervix closed.    Musculoskeletal: Normal range of motion. She exhibits no edema.  Neurological: She is alert and oriented to person, place, and time.  Skin: Skin is warm and dry.  Psychiatric: She has a normal mood and affect. Her behavior is normal.   Fetal tracing:  Baseline: 140s No decels No contractions  MAU Course  Procedures  MDM Fetal tracing is reassuring. Cervix is closed Pt declines Tylenol for symptom relief because she has 'used this too much recently for HA.'  Assessment and Plan  A:  1. Abdominal pain affecting pregnancy   2. Back pain affecting pregnancy     P: Discharge to home Maternity support belt and good supportive shoes recommended OTC Tylenol Good fluid intake advised Keep OB appts Patient may return to MAU as needed or if her condition were to change or worsen   Bertram DenverKaren E Teague Clark 03/03/2015, 10:06 AM

## 2015-03-03 NOTE — Discharge Instructions (Signed)
Abdominal Pain During Pregnancy °Abdominal pain is common in pregnancy. Most of the time, it does not cause harm. There are many causes of abdominal pain. Some causes are more serious than others. Some of the causes of abdominal pain in pregnancy are easily diagnosed. Occasionally, the diagnosis takes time to understand. Other times, the cause is not determined. Abdominal pain can be a sign that something is very wrong with the pregnancy, or the pain may have nothing to do with the pregnancy at all. For this reason, always tell your health care provider if you have any abdominal discomfort. °HOME CARE INSTRUCTIONS  °Monitor your abdominal pain for any changes. The following actions may help to alleviate any discomfort you are experiencing: °· Do not have sexual intercourse or put anything in your vagina until your symptoms go away completely. °· Get plenty of rest until your pain improves. °· Drink clear fluids if you feel nauseous. Avoid solid food as long as you are uncomfortable or nauseous. °· Only take over-the-counter or prescription medicine as directed by your health care provider. °· Keep all follow-up appointments with your health care provider. °SEEK IMMEDIATE MEDICAL CARE IF: °· You are bleeding, leaking fluid, or passing tissue from the vagina. °· You have increasing pain or cramping. °· You have persistent vomiting. °· You have painful or bloody urination. °· You have a fever. °· You notice a decrease in your baby's movements. °· You have extreme weakness or feel faint. °· You have shortness of breath, with or without abdominal pain. °· You develop a severe headache with abdominal pain. °· You have abnormal vaginal discharge with abdominal pain. °· You have persistent diarrhea. °· You have abdominal pain that continues even after rest, or gets worse. °MAKE SURE YOU:  °· Understand these instructions. °· Will watch your condition. °· Will get help right away if you are not doing well or get worse. °    °This information is not intended to replace advice given to you by your health care provider. Make sure you discuss any questions you have with your health care provider. °  °Document Released: 02/19/2005 Document Revised: 12/10/2012 Document Reviewed: 09/18/2012 °Elsevier Interactive Patient Education ©2016 [employer] °Back Pain in Pregnancy °Back pain during pregnancy is common. It happens in about half of all pregnancies. It is important for you and your baby that you remain active during your pregnancy. If you feel that back pain is not allowing you to remain active or sleep well, it is time to see your caregiver. Back pain may be caused by several factors related to changes during your pregnancy. Fortunately, unless you had trouble with your back before your pregnancy, the pain is likely to get better after you deliver. °Low back pain usually occurs between the fifth and seventh months of pregnancy. It can, however, happen in the first couple months. Factors that increase the risk of back problems include:  °· Previous back problems. °· Injury to your back. °· Having twins or multiple births. °· A chronic cough. °· Stress. °· Job-related repetitive motions. °· Muscle or spinal disease in the back. °· Family history of back problems, ruptured (herniated) discs, or osteoporosis. °· Depression, anxiety, and panic attacks. °CAUSES  °· When you are pregnant, your body produces a hormone called relaxin. This hormone makes the ligaments connecting the low back and pubic bones more flexible. This flexibility allows the baby to be delivered more easily. When your ligaments are loose, your muscles need to work harder to support your   back. Soreness in your back can come from tired muscles. Soreness can also come from back tissues that are irritated since they are receiving less support. °· As the baby grows, it puts pressure on the nerves and blood vessels in your pelvis. This can cause back pain. °· As the baby  grows and gets heavier during pregnancy, the uterus pushes the stomach muscles forward and changes your center of gravity. This makes your back muscles work harder to maintain good posture. °SYMPTOMS  °Lumbar pain during pregnancy °Lumbar pain during pregnancy usually occurs at or above the waist in the center of the back. There may be pain and numbness that radiates into your leg or foot. This is similar to low back pain experienced by non-pregnant women. It usually increases with sitting for long periods of time, standing, or repetitive lifting. Tenderness may also be present in the muscles along your upper back. °Posterior pelvic pain during pregnancy °Pain in the back of the pelvis is more common than lumbar pain in pregnancy. It is a deep pain felt in your side at the waistline, or across the tailbone (sacrum), or in both places. You may have pain on one or both sides. This pain can also go into the buttocks and backs of the upper thighs. Pubic and groin pain may also be present. The pain does not quickly resolve with rest, and morning stiffness may also be present. °Pelvic pain during pregnancy can be brought on by most activities. A high level of fitness before and during pregnancy may or may not prevent this problem. Labor pain is usually 1 to 2 minutes apart, lasts for about 1 minute, and involves a bearing down feeling or pressure in your pelvis. However, if you are at term with the pregnancy, constant low back pain can be the beginning of early labor, and you should be aware of this. °DIAGNOSIS  °X-rays of the back should not be done during the first 12 to 14 weeks of the pregnancy and only when absolutely necessary during the rest of the pregnancy. MRIs do not give off radiation and are safe during pregnancy. MRIs also should only be done when absolutely necessary. °HOME CARE INSTRUCTIONS °· Exercise as directed by your caregiver. Exercise is the most effective way to prevent or manage back pain. If you  have a back problem, it is especially important to avoid sports that require sudden body movements. Swimming and walking are great activities. °· Do not stand in one place for long periods of time. °· Do not wear high heels. °· Sit in chairs with good posture. Use a pillow on your lower back if necessary. Make sure your head rests over your shoulders and is not hanging forward. °· Try sleeping on your side, preferably the left side, with a pillow or two between your legs. If you are sore after a night's rest, your bed may be too soft. Try placing a board between your mattress and box spring. °· Listen to your body when lifting. If you are experiencing pain, ask for help or try bending your knees more so you can use your leg muscles rather than your back muscles. Squat down when picking up something from the floor. Do not bend over. °· Eat a healthy diet. Try to gain weight within your caregiver's recommendations. °· Use heat or cold packs 3 to 4 times a day for 15 minutes to help with the pain. °· Only take over-the-counter or prescription medicines for pain, discomfort, or fever as directed by your caregiver. °  Sudden (acute) back pain °· Use bed rest for only the most extreme, acute episodes of back pain. Prolonged bed rest over 48 hours will aggravate your condition. °· Ice is very effective for acute conditions. °¨ Put ice in a plastic bag. °¨ Place a towel between your skin and the bag. °¨ Leave the ice on for 10 to 20 minutes every 2 hours, or as needed. °· Using heat packs for 30 minutes prior to activities is also helpful. °Continued back pain °See your caregiver if you have continued problems. Your caregiver can help or refer you for appropriate physical therapy. With conditioning, most back problems can be avoided. Sometimes, a more serious issue may be the cause of back pain. You should be seen right away if new problems seem to be developing. Your caregiver may recommend: °· A maternity girdle. °· An  elastic sling. °· A back brace. °· A massage therapist or acupuncture. °SEEK MEDICAL CARE IF:  °· You are not able to do most of your daily activities, even when taking the pain medicine you were given. °· You need a referral to a physical therapist or chiropractor. °· You want to try acupuncture. °SEEK IMMEDIATE MEDICAL CARE IF: °· You develop numbness, tingling, weakness, or problems with the use of your arms or legs. °· You develop severe back pain that is no longer relieved with medicines. °· You have a sudden change in bowel or bladder control. °· You have increasing pain in other areas of the body. °· You develop shortness of breath, dizziness, or fainting. °· You develop nausea, vomiting, or sweating. °· You have back pain which is similar to labor pains. °· You have back pain along with your water breaking or vaginal bleeding. °· You have back pain or numbness that travels down your leg. °· Your back pain developed after you fell. °· You develop pain on one side of your back. You may have a kidney stone. °· You see blood in your urine. You may have a bladder infection or kidney stone. °· You have back pain with blisters. You may have shingles. °Back pain is fairly common during pregnancy but should not be accepted as just part of the process. Back pain should always be treated as soon as possible. This will make your pregnancy as pleasant as possible. °  °This information is not intended to replace advice given to you by your health care provider. Make sure you discuss any questions you have with your health care provider. °  °Document Released: 05/30/2005 Document Revised: 05/14/2011 Document Reviewed: 07/11/2010 °Elsevier Interactive Patient Education ©2016 [employer] Inc[employer] ° °

## 2015-03-03 NOTE — MAU Note (Signed)
Pt presents to MAU with complaints of pelvic pain and lower back pain. Burning with urination

## 2015-03-06 DIAGNOSIS — K602 Anal fissure, unspecified: Secondary | ICD-10-CM

## 2015-03-06 HISTORY — DX: Anal fissure, unspecified: K60.2

## 2015-03-06 NOTE — L&D Delivery Note (Signed)
Delivery Note  This is a 25 year old G 3 P2 who was admitted for Not in labor.. She progressed with cytotec to 7 cm, then pitocin and AROM until complete with epidural to the second stage of labor.  She pushed for 5-10 min.  At 9:24am she delivered a viable infant female, cephalic, over an intact perineum.  A nuchal cord   was not identified. Infant placed on maternal abdomen.  Delayed cord clamping was performed for 10 minutes.  Cord double clamped and cut.  Apgar scores were 9 and 9. The placenta delivered spontaneously, shultz, with a 3 vessel cord.  Inspection revealed none. The uterus was firm bleeding stable.  EBL was 100.    Placenta and umbilical artery blood gas were not sent.  There were no complications during the procedure.  Mom and baby skin to skin following delivery. Left in stable condition.  Vaginal, Spontaneous Delivery (Presentation: Left  Anterior).  APGAR: 9, 9; weight 7 lb 12.5 oz (3530 g).   Placenta status: Intact, Spontaneous.  Cord: 3 vessels with the following complications: None.  Cord pH: N/A  Anesthesia: Epidural  Episiotomy: None Lacerations: None Suture Repair: none Est. Blood Loss (mL): 100  Mom to postpartum.  Baby to Couplet care / Skin to Skin.  Roe Coombsachelle A Denney, CNM 06/01/2015, 10:56 AM

## 2015-03-16 ENCOUNTER — Encounter: Payer: Medicaid Other | Admitting: Certified Nurse Midwife

## 2015-03-18 ENCOUNTER — Ambulatory Visit (INDEPENDENT_AMBULATORY_CARE_PROVIDER_SITE_OTHER): Payer: Medicaid Other | Admitting: Certified Nurse Midwife

## 2015-03-18 ENCOUNTER — Other Ambulatory Visit: Payer: Self-pay | Admitting: Certified Nurse Midwife

## 2015-03-18 VITALS — BP 116/70 | HR 99 | Wt 205.0 lb

## 2015-03-18 DIAGNOSIS — N390 Urinary tract infection, site not specified: Secondary | ICD-10-CM

## 2015-03-18 DIAGNOSIS — Z3483 Encounter for supervision of other normal pregnancy, third trimester: Secondary | ICD-10-CM

## 2015-03-18 LAB — POCT URINALYSIS DIPSTICK
BILIRUBIN UA: NEGATIVE
Blood, UA: NEGATIVE
GLUCOSE UA: NEGATIVE
Ketones, UA: NEGATIVE
Leukocytes, UA: NEGATIVE
Protein, UA: NEGATIVE
SPEC GRAV UA: 1.01
UROBILINOGEN UA: NEGATIVE
pH, UA: 7

## 2015-03-19 LAB — URINE CULTURE: Colony Count: 3000

## 2015-03-19 NOTE — Progress Notes (Signed)
Subjective:    Erin Ruiz is a 25 y.o. female being seen today for her obstetrical visit. She is at 5421w4d gestation. Patient reports: backache, no bleeding, no contractions, no cramping, no leaking and round ligament pain . Fetal movement: normal.  Problem List Items Addressed This Visit    None    Visit Diagnoses    Supervision of other normal pregnancy, antepartum, third trimester    -  Primary    Relevant Orders    US OB Follow Up    POCT urinalysis dipstick (Completed)    UTI (lower urinary tract infection)        Relevant Orders    Urine culture      Patient Active Problem List   Diagnosis Date Noted  . Encounter for supervision of other normal pregnancy in first trimester 12/02/2014   Objective:    BP 116/70 mmHg  Pulse 99  Wt 205 lb (92.987 kg)  LMP 09/14/2014 (Approximate) FHT: 145 BPM  Uterine Size: size equals dates     Assessment:    Pregnancy @ 1421w4d    Plan:    OBGCT: discussed. Signs and symptoms of preterm labor: discussed.  Labs, problem list reviewed and updated 2 hr GTT planned Follow up in 2 weeks.

## 2015-03-31 ENCOUNTER — Ambulatory Visit (INDEPENDENT_AMBULATORY_CARE_PROVIDER_SITE_OTHER): Payer: Medicaid Other

## 2015-03-31 DIAGNOSIS — Z0373 Encounter for suspected fetal anomaly ruled out: Secondary | ICD-10-CM

## 2015-03-31 DIAGNOSIS — Z3483 Encounter for supervision of other normal pregnancy, third trimester: Secondary | ICD-10-CM

## 2015-04-01 ENCOUNTER — Other Ambulatory Visit: Payer: Medicaid Other

## 2015-04-01 ENCOUNTER — Ambulatory Visit (INDEPENDENT_AMBULATORY_CARE_PROVIDER_SITE_OTHER): Payer: Medicaid Other | Admitting: Certified Nurse Midwife

## 2015-04-01 VITALS — BP 115/72 | HR 94 | Wt 206.0 lb

## 2015-04-01 DIAGNOSIS — Z3483 Encounter for supervision of other normal pregnancy, third trimester: Secondary | ICD-10-CM

## 2015-04-01 LAB — POCT URINALYSIS DIPSTICK
Bilirubin, UA: NEGATIVE
GLUCOSE UA: NEGATIVE
KETONES UA: NEGATIVE
Leukocytes, UA: NEGATIVE
Nitrite, UA: NEGATIVE
Protein, UA: NEGATIVE
RBC UA: NEGATIVE
Urobilinogen, UA: NEGATIVE
pH, UA: 8

## 2015-04-01 NOTE — Progress Notes (Signed)
Pt is having increase in pressure.  Pt is having crampiness with intercourse.  Pt is having experience feeling pulse in her face and head, lightheaded and dizziness random occurences. Pt is having some increase in swelling.

## 2015-04-02 LAB — CBC
HEMATOCRIT: 37.2 % (ref 36.0–46.0)
Hemoglobin: 12 g/dL (ref 12.0–15.0)
MCH: 29.1 pg (ref 26.0–34.0)
MCHC: 32.3 g/dL (ref 30.0–36.0)
MCV: 90.3 fL (ref 78.0–100.0)
MPV: 9.7 fL (ref 8.6–12.4)
PLATELETS: 155 10*3/uL (ref 150–400)
RBC: 4.12 MIL/uL (ref 3.87–5.11)
RDW: 13 % (ref 11.5–15.5)
WBC: 10.2 10*3/uL (ref 4.0–10.5)

## 2015-04-02 LAB — RPR

## 2015-04-02 LAB — HIV ANTIBODY (ROUTINE TESTING W REFLEX): HIV 1&2 Ab, 4th Generation: NONREACTIVE

## 2015-04-03 NOTE — Progress Notes (Signed)
Subjective:    Erin Ruiz is a 25 y.o. female being seen today for her obstetrical visit. She is at [redacted]w[redacted]d gestation. Patient reports backache, no bleeding, no contractions, no cramping, no leaking and dizziness with position changes, discussed vena cava syndrome and slow position changes. Fetal movement: normal.  Problem List Items Addressed This Visit    None    Visit Diagnoses    Encounter for supervision of other normal pregnancy in third trimester    -  Primary    Relevant Orders    POCT urinalysis dipstick (Completed)    Glucose Tolerance, 2 Hours w/1 Hour    CBC    HIV antibody    RPR      Patient Active Problem List   Diagnosis Date Noted  . Encounter for supervision of other normal pregnancy in first trimester 12/02/2014   Objective:    BP 115/72 mmHg  Pulse 94  Wt 206 lb (93.441 kg)  LMP 09/14/2014 (Approximate) FHT:  150 BPM  Uterine Size: size equals dates  Presentation: cephalic     Assessment:    Pregnancy @ [redacted]w[redacted]d weeks   Lumbar back pain  Plan:   Rx: abdominal maternity support belt   labs reviewed, problem list updated Consent signed. GBS planning TDAP offered  Rhogam given for RH negative Pediatrician: discussed. Infant feeding: plans to breastfeed. Maternity leave: N/A. Cigarette smoking: never smoked. Orders Placed This Encounter  Procedures  . Glucose Tolerance, 2 Hours w/1 Hour  . CBC  . HIV antibody  . RPR  . POCT urinalysis dipstick   No orders of the defined types were placed in this encounter.   Follow up in 2 Weeks.

## 2015-04-04 ENCOUNTER — Inpatient Hospital Stay (HOSPITAL_COMMUNITY)
Admission: AD | Admit: 2015-04-04 | Discharge: 2015-04-04 | Disposition: A | Discharge status: Home / Self Care | Payer: Medicaid Other | Source: Ambulatory Visit | Attending: Obstetrics | Admitting: Obstetrics

## 2015-04-04 ENCOUNTER — Encounter (HOSPITAL_COMMUNITY): Payer: Self-pay | Admitting: *Deleted

## 2015-04-04 DIAGNOSIS — K2970 Gastritis, unspecified, without bleeding: Secondary | ICD-10-CM | POA: Diagnosis not present

## 2015-04-04 DIAGNOSIS — Z3A28 28 weeks gestation of pregnancy: Secondary | ICD-10-CM | POA: Insufficient documentation

## 2015-04-04 DIAGNOSIS — O99613 Diseases of the digestive system complicating pregnancy, third trimester: Secondary | ICD-10-CM | POA: Insufficient documentation

## 2015-04-04 DIAGNOSIS — O212 Late vomiting of pregnancy: Secondary | ICD-10-CM | POA: Diagnosis present

## 2015-04-04 DIAGNOSIS — Z87891 Personal history of nicotine dependence: Secondary | ICD-10-CM | POA: Diagnosis not present

## 2015-04-04 DIAGNOSIS — A084 Viral intestinal infection, unspecified: Secondary | ICD-10-CM | POA: Diagnosis not present

## 2015-04-04 LAB — URINALYSIS, ROUTINE W REFLEX MICROSCOPIC
GLUCOSE, UA: NEGATIVE mg/dL
Hgb urine dipstick: NEGATIVE
Leukocytes, UA: NEGATIVE
Nitrite: NEGATIVE
PH: 5.5 (ref 5.0–8.0)
Protein, ur: NEGATIVE mg/dL

## 2015-04-04 MED ORDER — ONDANSETRON 8 MG PO TBDP: 8.0000 mg | ORAL_TABLET | Freq: Three times a day (TID) | ORAL | Status: DC | PRN

## 2015-04-04 MED ORDER — ACETAMINOPHEN-CODEINE #3 300-30 MG PO TABS
2.0000 | ORAL_TABLET | ORAL | Status: DC | PRN
  Administered 2015-04-04: 2 via ORAL
  Filled 2015-04-04: qty 2

## 2015-04-04 MED ORDER — ONDANSETRON 8 MG PO TBDP
8.0000 mg | ORAL_TABLET | Freq: Once | ORAL | Status: AC
  Administered 2015-04-04: 8 mg via ORAL
  Filled 2015-04-04: qty 1

## 2015-04-04 NOTE — MAU Note (Signed)
Pt presents complaining of nausea, vomiting and diarrhea that started at 0300am. Denies vaginal bleeding or discharge. Pt complains of lower back pain and some tightening. Reports good fetal movement.

## 2015-04-04 NOTE — MAU Provider Note (Signed)
History   G#p2002 @ 28.6 wks in with c/o nausea and vomiting since 0300 this morning.states daughter has been sick with GI bug.  CSN: [id number]  Arrival date & time 04/04/15  1843   None     Chief Complaint  Patient presents with  . Contractions    HPI  Past Medical History  Diagnosis Date  . Heart murmur     childhood  . Depression     Past Surgical History  Procedure Laterality Date  . Tonsillectomy  2004    Family History  Problem Relation Age of Onset  . Hypertension Mother   . Asthma Father     Social History  Substance Use Topics  . Smoking status: Former Games developer  . Smokeless tobacco: Not on file  . Alcohol Use: No    OB History    Gravida Para Term Preterm AB TAB SAB Ectopic Multiple Living   Review of Systems  Constitutional: Positive for fatigue.  HENT: Negative.   Eyes: Negative.   Respiratory: Negative.   Cardiovascular: Negative.   Gastrointestinal: Positive for nausea, vomiting, abdominal pain and diarrhea.  Endocrine: Negative.   Genitourinary: Negative.   Musculoskeletal: Negative.   Skin: Negative.   Neurological: Negative.   Hematological: Negative.     Allergies  Review of patient's allergies indicates no known allergies.  Home Medications  No current outpatient prescriptions on file.  BP 129/63 mmHg  Pulse 124  Temp(Src) 98.6 F (37 C) (Oral)  Resp 18  LMP 09/14/2014 (Approximate)  Physical Exam  Constitutional: She is oriented to person, place, and time. She appears well-developed and well-nourished.  HENT:  Head: Normocephalic.  Eyes: Pupils are equal, round, and reactive to light.  Neck: Normal range of motion.  Cardiovascular: Normal rate, regular rhythm, normal heart sounds and intact distal pulses.   Pulmonary/Chest: Effort normal and breath sounds normal.  Abdominal: Soft. Bowel sounds are normal.  Genitourinary: Vagina normal.  Musculoskeletal: Normal range of motion.  Neurological: She  is alert and oriented to person, place, and time. She has normal reflexes.  Skin: Skin is warm and dry.  Psychiatric: She has a normal mood and affect. Her behavior is normal. Judgment and thought content normal.    MAU Course  Procedures (including critical care time)  Labs Reviewed  URINALYSIS, ROUTINE W REFLEX MICROSCOPIC (NOT AT [facility])   No results found.   No diagnosis found.    MDM  Viral gastritis. Will treat with oral zofran.keeping down fluids will d/c home with zofran

## 2015-04-05 ENCOUNTER — Telehealth: Payer: Self-pay | Admitting: *Deleted

## 2015-04-05 NOTE — Telephone Encounter (Signed)
Patient was seen at MAU for viral gastritis. The Zofran is helping with the nausea. Patient is complaining of back and hip pain so bad she can hardly walk- she wants to know if there is anything that she can take or be given for that.

## 2015-04-06 ENCOUNTER — Other Ambulatory Visit: Payer: Self-pay | Admitting: Obstetrics

## 2015-04-06 DIAGNOSIS — M549 Dorsalgia, unspecified: Secondary | ICD-10-CM

## 2015-04-06 LAB — GLUCOSE TOLERANCE, 2 HOURS W/ 1HR
Glucose, 1 hour: 78 mg/dL (ref 70–170)
Glucose, 2 hour: 115 mg/dL (ref 70–139)
Glucose, Fasting: 72 mg/dL (ref 65–99)

## 2015-04-06 MED ORDER — CYCLOBENZAPRINE HCL 10 MG PO TABS: 10.0000 mg | ORAL_TABLET | Freq: Three times a day (TID) | ORAL | Status: DC | PRN

## 2015-04-15 ENCOUNTER — Ambulatory Visit (INDEPENDENT_AMBULATORY_CARE_PROVIDER_SITE_OTHER): Payer: Medicaid Other | Admitting: Certified Nurse Midwife

## 2015-04-15 ENCOUNTER — Encounter: Payer: Medicaid Other | Admitting: Certified Nurse Midwife

## 2015-04-15 VITALS — BP 113/72 | HR 104 | Temp 97.3°F | Wt 205.0 lb

## 2015-04-15 DIAGNOSIS — Z3483 Encounter for supervision of other normal pregnancy, third trimester: Secondary | ICD-10-CM

## 2015-04-15 LAB — POCT URINALYSIS DIPSTICK
BILIRUBIN UA: NEGATIVE
Glucose, UA: NEGATIVE
KETONES UA: NEGATIVE
NITRITE UA: NEGATIVE
PH UA: 7
PROTEIN UA: NEGATIVE
RBC UA: NEGATIVE
Spec Grav, UA: 1.015
Urobilinogen, UA: NEGATIVE

## 2015-04-15 NOTE — Progress Notes (Signed)
Subjective:    Erin Ruiz is a 25 y.o. female being seen today for her obstetrical visit. She is at [redacted]w[redacted]d gestation. Patient reports backache, no bleeding, no contractions, no cramping and no leaking. Fetal movement: normal.  Had gastrointestinal virus last week, is doing better now.  Is wearing abdominal support belt.    Problem List Items Addressed This Visit    None    Visit Diagnoses    Supervision of other normal pregnancy, antepartum, third trimester    -  Primary    Relevant Orders    POCT urinalysis dipstick (Completed)      Patient Active Problem List   Diagnosis Date Noted  . Encounter for supervision of other normal pregnancy in first trimester 12/02/2014   Objective:    BP 113/72 mmHg  Pulse 104  Temp(Src) 97.3 F (36.3 C)  Wt 205 lb (92.987 kg)  LMP 09/14/2014 (Approximate) FHT:  135 BPM  Uterine Size: size equals dates  Presentation: cephalic     Assessment:    Pregnancy @ [redacted]w[redacted]d weeks   Normal pregnancy discomforts Plan:      labs reviewed, problem list updated Consent signed. GBS planning TDAP offered  Rhogam given for RH negative Pediatrician: discussed. Infant feeding: plans to breastfeed. Maternity leave: N/A. Cigarette smoking: never smoked. Orders Placed This Encounter  Procedures  . POCT urinalysis dipstick   No orders of the defined types were placed in this encounter.   Follow up in 2 Week.

## 2015-04-29 ENCOUNTER — Ambulatory Visit (INDEPENDENT_AMBULATORY_CARE_PROVIDER_SITE_OTHER): Payer: Medicaid Other | Admitting: Certified Nurse Midwife

## 2015-04-29 VITALS — BP 116/69 | HR 102 | Temp 97.6°F | Wt 208.0 lb

## 2015-04-29 DIAGNOSIS — Z3483 Encounter for supervision of other normal pregnancy, third trimester: Secondary | ICD-10-CM

## 2015-04-29 LAB — POCT URINALYSIS DIPSTICK
BILIRUBIN UA: NEGATIVE
Blood, UA: NEGATIVE
Glucose, UA: 50
KETONES UA: NEGATIVE
LEUKOCYTES UA: NEGATIVE
Nitrite, UA: NEGATIVE
PROTEIN UA: NEGATIVE
Spec Grav, UA: 1.02
Urobilinogen, UA: NEGATIVE
pH, UA: 5

## 2015-04-29 NOTE — Progress Notes (Signed)
Subjective:    Erin Ruiz is a 25 y.o. female being seen today for her obstetrical visit. She is at [redacted]w[redacted]d gestation. Patient reports backache, no bleeding, no cramping and occasional contractions. Fetal movement: normal.  Problem List Items Addressed This Visit    None    Visit Diagnoses    Encounter for supervision of other normal pregnancy in third trimester    -  Primary    Relevant Orders    POCT urinalysis dipstick (Completed)      Patient Active Problem List   Diagnosis Date Noted  . Encounter for supervision of other normal pregnancy in first trimester 12/02/2014   Objective:    BP 116/69 mmHg  Pulse 102  Temp(Src) 97.6 F (36.4 C)  Wt 208 lb (94.348 kg)  LMP 09/14/2014 (Approximate) FHT:  145 BPM  Uterine Size: 34 cm and size greater than dates  Presentation: cephalic     Assessment:    Pregnancy @ [redacted]w[redacted]d weeks   Plan:     labs reviewed, problem list updated Consent signed. GBS sent TDAP offered  Rhogam given for RH negative Pediatrician: discussed. Infant feeding: plans to breastfeed. Maternity leave: N/A. Cigarette smoking: never smoked. Orders Placed This Encounter  Procedures  . POCT urinalysis dipstick   No orders of the defined types were placed in this encounter.   Follow up in 2 Weeks with GBS.

## 2015-05-13 ENCOUNTER — Ambulatory Visit (INDEPENDENT_AMBULATORY_CARE_PROVIDER_SITE_OTHER): Payer: Medicaid Other | Admitting: Certified Nurse Midwife

## 2015-05-13 VITALS — BP 121/75 | HR 107 | Temp 98.1°F | Wt 213.0 lb

## 2015-05-13 DIAGNOSIS — F32A Depression, unspecified: Secondary | ICD-10-CM

## 2015-05-13 DIAGNOSIS — N760 Acute vaginitis: Secondary | ICD-10-CM

## 2015-05-13 DIAGNOSIS — F329 Major depressive disorder, single episode, unspecified: Secondary | ICD-10-CM

## 2015-05-13 DIAGNOSIS — Z3483 Encounter for supervision of other normal pregnancy, third trimester: Secondary | ICD-10-CM

## 2015-05-13 LAB — POCT URINALYSIS DIPSTICK
BILIRUBIN UA: NEGATIVE
Blood, UA: NEGATIVE
GLUCOSE UA: NEGATIVE
KETONES UA: NEGATIVE
LEUKOCYTES UA: NEGATIVE
Nitrite, UA: NEGATIVE
Protein, UA: NEGATIVE
Spec Grav, UA: 1.015
Urobilinogen, UA: NEGATIVE
pH, UA: 6

## 2015-05-13 MED ORDER — TERCONAZOLE 0.4 % VA CREA: 1.0000 | TOPICAL_CREAM | Freq: Every day | VAGINAL | Status: DC

## 2015-05-13 MED ORDER — FLUCONAZOLE 100 MG PO TABS: 100.0000 mg | ORAL_TABLET | Freq: Once | ORAL | Status: DC

## 2015-05-13 MED ORDER — BUPROPION HCL ER (SR) 150 MG PO TB12: 150.0000 mg | ORAL_TABLET | Freq: Two times a day (BID) | ORAL | Status: DC

## 2015-05-13 NOTE — Addendum Note (Signed)
Addended by: Marya LandryFOSTER, Tenaya Hilyer D on: 05/13/2015 10:25 AM   Modules accepted: Orders

## 2015-05-13 NOTE — Addendum Note (Signed)
Addended by: Marya LandryFOSTER, SUZANNE D on: 05/13/2015 10:30 AM   Modules accepted: Orders

## 2015-05-13 NOTE — Progress Notes (Signed)
Subjective:    Erin Ruiz is a 25 y.o. female being seen today for her obstetrical visit. She is at 2070w3d gestation. Patient reports no bleeding, no contractions, no cramping, no leaking and anxiety. Fetal movement: normal.  Problem List Items Addressed This Visit    None    Visit Diagnoses    Vaginitis    -  Primary    Relevant Medications    fluconazole (DIFLUCAN) 100 MG tablet    terconazole (TERAZOL 7) 0.4 % vaginal cream    Depression        Relevant Medications    buPROPion (WELLBUTRIN SR) 150 MG 12 hr tablet      Patient Active Problem List   Diagnosis Date Noted  . Encounter for supervision of other normal pregnancy in first trimester 12/02/2014   Objective:    BP 121/75 mmHg  Pulse 107  Temp(Src) 98.1 F (36.7 C)  Wt 213 lb (96.616 kg)  LMP 09/14/2014 (Approximate) FHT:  135 BPM  Uterine Size: 36 cm and size greater than dates  Presentation: cephalic     Assessment:    Pregnancy @ 6870w3d weeks   Anxiety and depression Plan:     labs reviewed, problem list updated Consent signed. GBS sent TDAP offered  Rhogam given for RH negative Pediatrician: discussed. Infant feeding: plans to breastfeed. Maternity leave: discussed. Cigarette smoking: quit at start of first pregnancy. No orders of the defined types were placed in this encounter.   Meds ordered this encounter  Medications  . fluconazole (DIFLUCAN) 100 MG tablet    Sig: Take 1 tablet (100 mg total) by mouth once. Repeat dose in 48-72 hour.    Dispense:  3 tablet    Refill:  0  . terconazole (TERAZOL 7) 0.4 % vaginal cream    Sig: Place 1 applicator vaginally at bedtime.    Dispense:  45 g    Refill:  0  . buPROPion (WELLBUTRIN SR) 150 MG 12 hr tablet    Sig: Take 1 tablet (150 mg total) by mouth 2 (two) times daily.    Dispense:  60 tablet    Refill:  2   Follow up in 1 Week.

## 2015-05-15 LAB — STREP B DNA PROBE: STREP GROUP B AG: NOT DETECTED

## 2015-05-18 ENCOUNTER — Other Ambulatory Visit: Payer: Self-pay | Admitting: Certified Nurse Midwife

## 2015-05-18 DIAGNOSIS — B9689 Other specified bacterial agents as the cause of diseases classified elsewhere: Secondary | ICD-10-CM

## 2015-05-18 DIAGNOSIS — N760 Acute vaginitis: Principal | ICD-10-CM

## 2015-05-18 LAB — SURESWAB, VAGINOSIS/VAGINITIS PLUS
ATOPOBIUM VAGINAE: 5.3 Log (cells/mL)
BV CATEGORY: UNDETERMINED — AB
C. ALBICANS, DNA: NOT DETECTED
C. TRACHOMATIS RNA, TMA: NOT DETECTED
C. glabrata, DNA: NOT DETECTED
C. parapsilosis, DNA: NOT DETECTED
C. tropicalis, DNA: NOT DETECTED
Gardnerella vaginalis: 7.6 Log (cells/mL)
LACTOBACILLUS SPECIES: 7 Log (cells/mL)
MEGASPHAERA SPECIES: NOT DETECTED Log (cells/mL)
N. gonorrhoeae RNA, TMA: NOT DETECTED
T. VAGINALIS RNA, QL TMA: NOT DETECTED

## 2015-05-18 MED ORDER — METRONIDAZOLE 500 MG PO TABS: 500.0000 mg | ORAL_TABLET | Freq: Two times a day (BID) | ORAL | Status: DC

## 2015-05-20 ENCOUNTER — Ambulatory Visit (INDEPENDENT_AMBULATORY_CARE_PROVIDER_SITE_OTHER): Payer: Medicaid Other | Admitting: Certified Nurse Midwife

## 2015-05-20 VITALS — BP 137/80 | HR 111 | Wt 214.0 lb

## 2015-05-20 DIAGNOSIS — Z3483 Encounter for supervision of other normal pregnancy, third trimester: Secondary | ICD-10-CM

## 2015-05-20 LAB — POCT URINALYSIS DIPSTICK
Bilirubin, UA: NEGATIVE
Blood, UA: NEGATIVE
Glucose, UA: NEGATIVE
KETONES UA: NEGATIVE
Leukocytes, UA: NEGATIVE
Nitrite, UA: NEGATIVE
PROTEIN UA: NEGATIVE
SPEC GRAV UA: 1.01
Urobilinogen, UA: NEGATIVE
pH, UA: 5

## 2015-05-20 NOTE — Progress Notes (Signed)
Subjective:    Erin Ruiz is a 25 y.o. female being seen today for her obstetrical visit. She is at 3529w3d gestation. Patient reports backache, no bleeding, occasional contractions and vaginal irritation. Fetal movement: normal.  Reports feeling vaginal wetness increased from last week.  States that her medication is making her dizzy, does not desire to change antidepressants at this time.  Was on Zoloft but was having increased anxiety, changed to Wellbutrin.   Problem List Items Addressed This Visit    None    Visit Diagnoses    Encounter for supervision of other normal pregnancy in third trimester    -  Primary    Relevant Orders    POCT urinalysis dipstick      Patient Active Problem List   Diagnosis Date Noted  . Encounter for supervision of other normal pregnancy in first trimester 12/02/2014   Objective:    BP 137/80 mmHg  Pulse 111  Wt 214 lb (97.07 kg)  LMP 09/14/2014 (Approximate) FHT:  158 BPM  Uterine Size: 37 cm and size greater than dates  Presentation: cephalic   Cervix: soft, 1 cm, posterior.   Negative Nitrazine paper.   Assessment:    Pregnancy @ 5229w3d weeks   Plan:     labs reviewed, problem list updated Consent signed. GBS sent TDAP offered  Rhogam given for RH negative Pediatrician: discussed. Infant feeding: plans to breastfeed. Maternity leave: discussed. Cigarette smoking: never smoked. Orders Placed This Encounter  Procedures  . POCT urinalysis dipstick   No orders of the defined types were placed in this encounter.   Follow up in 1 Week.

## 2015-05-23 ENCOUNTER — Telehealth: Payer: Self-pay | Admitting: *Deleted

## 2015-05-23 NOTE — Telephone Encounter (Signed)
Patient states she is having a hard time sleeping. She is waking with her hands swollen and fingertips numb and throbbing. 12:03 Call to patient - LM that we can Rx wrist splints to help at night to keep her wrist straight to promote circulation. Please call back to let us know.

## 2015-05-25 ENCOUNTER — Ambulatory Visit (INDEPENDENT_AMBULATORY_CARE_PROVIDER_SITE_OTHER): Payer: Medicaid Other | Admitting: Certified Nurse Midwife

## 2015-05-25 VITALS — BP 136/85 | HR 102 | Wt 217.0 lb

## 2015-05-25 DIAGNOSIS — O163 Unspecified maternal hypertension, third trimester: Secondary | ICD-10-CM

## 2015-05-25 DIAGNOSIS — Z3483 Encounter for supervision of other normal pregnancy, third trimester: Secondary | ICD-10-CM

## 2015-05-25 DIAGNOSIS — O133 Gestational [pregnancy-induced] hypertension without significant proteinuria, third trimester: Secondary | ICD-10-CM

## 2015-05-25 LAB — POCT URINALYSIS DIPSTICK
BILIRUBIN UA: NEGATIVE
Blood, UA: NEGATIVE
Glucose, UA: NEGATIVE
KETONES UA: NEGATIVE
LEUKOCYTES UA: NEGATIVE
Nitrite, UA: NEGATIVE
Protein, UA: NEGATIVE
Spec Grav, UA: 1.01
Urobilinogen, UA: NEGATIVE
pH, UA: 6

## 2015-05-26 LAB — CBC
HEMOGLOBIN: 12.7 g/dL (ref 11.1–15.9)
Hematocrit: 37.6 % (ref 34.0–46.6)
MCH: 28.8 pg (ref 26.6–33.0)
MCHC: 33.8 g/dL (ref 31.5–35.7)
MCV: 85 fL (ref 79–97)
Platelets: 192 10*3/uL (ref 150–379)
RBC: 4.41 x10E6/uL (ref 3.77–5.28)
RDW: 13.5 % (ref 12.3–15.4)
WBC: 10.5 10*3/uL (ref 3.4–10.8)

## 2015-05-26 LAB — CREATININE, SERUM
Creatinine, Ser: 0.52 mg/dL — ABNORMAL LOW (ref 0.57–1.00)
GFR calc non Af Amer: 135 mL/min/{1.73_m2} (ref >59)
GFR, EST AFRICAN AMERICAN: 156 mL/min/{1.73_m2} (ref >59)

## 2015-05-26 LAB — PATHOLOGIST SMEAR REVIEW
Path Rev PLTs: NORMAL
Path Rev RBC: NORMAL
Path Rev WBC: NORMAL

## 2015-05-26 LAB — PROTEIN / CREATININE RATIO, URINE
Creatinine, Urine: 46.6 mg/dL
PROTEIN/CREAT RATIO: 234 mg/g{creat} — AB (ref 0–200)
Protein, Ur: 10.9 mg/dL

## 2015-05-26 LAB — ALT: ALT: 15 IU/L (ref 0–32)

## 2015-05-26 LAB — AST: AST: 25 IU/L (ref 0–40)

## 2015-05-26 LAB — LACTATE DEHYDROGENASE: LDH: 202 IU/L (ref 119–226)

## 2015-05-26 NOTE — Progress Notes (Signed)
Subjective:    Erin Ruiz is a 25 y.o. female being seen today for her obstetrical visit. She is at 10747w2d gestation. Patient reports no bleeding, no leaking, occasional contractions and dizziness with position changes, hand/elbow pain, just doesn't feel well. Fetal movement: normal.  Problem List Items Addressed This Visit    None    Visit Diagnoses    Encounter for supervision of other normal pregnancy in third trimester    -  Primary    Relevant Orders    POCT urinalysis dipstick (Completed)    Protein / creatinine ratio, urine (Completed)    Lactate dehydrogenase (Completed)    CBC (Completed)    ALT (Completed)    AST (Completed)    Pathologist smear review (Completed)    Creatinine, serum (Completed)    Elevated blood pressure affecting pregnancy in third trimester, antepartum        Relevant Orders    Protein / creatinine ratio, urine (Completed)    Lactate dehydrogenase (Completed)    CBC (Completed)    ALT (Completed)    AST (Completed)    Pathologist smear review (Completed)    Creatinine, serum (Completed)      Patient Active Problem List   Diagnosis Date Noted  . Encounter for supervision of other normal pregnancy in first trimester 12/02/2014   Objective:    BP 136/85 mmHg  Pulse 102  Wt 217 lb (98.431 kg)  LMP 09/14/2014 (Approximate) FHT:  145 BPM  Uterine Size: size equals dates  Presentation: cephalic   Cervix: 1 cm dilated, soft, posterior.  -3 station  NST: reactive, + accels, no decels, moderate variability.  Cat. 1 tracing, no contractions  Assessment:    Pregnancy @ 8947w2d weeks   Reactive NST  slightly elevated systolic blood pressure Plan:     labs reviewed, problem list updated Consent signed. GBS results reviewed TDAP offered  Rhogam given for RH negative Pediatrician: discussed. Infant feeding: plans to breastfeed. Maternity leave: N/a. Cigarette smoking: never smoked. Orders Placed This Encounter  Procedures  . Protein /  creatinine ratio, urine  . Lactate dehydrogenase  . CBC  . ALT  . AST  . Pathologist smear review  . Creatinine, serum  . POCT urinalysis dipstick   No orders of the defined types were placed in this encounter.   Follow up in 1 Week.

## 2015-05-27 ENCOUNTER — Ambulatory Visit (INDEPENDENT_AMBULATORY_CARE_PROVIDER_SITE_OTHER): Payer: Medicaid Other | Admitting: Certified Nurse Midwife

## 2015-05-27 VITALS — BP 152/76 | HR 118 | Wt 219.0 lb

## 2015-05-27 DIAGNOSIS — O133 Gestational [pregnancy-induced] hypertension without significant proteinuria, third trimester: Secondary | ICD-10-CM

## 2015-05-27 DIAGNOSIS — O163 Unspecified maternal hypertension, third trimester: Secondary | ICD-10-CM

## 2015-05-27 DIAGNOSIS — Z3483 Encounter for supervision of other normal pregnancy, third trimester: Secondary | ICD-10-CM

## 2015-05-27 DIAGNOSIS — O1213 Gestational proteinuria, third trimester: Secondary | ICD-10-CM

## 2015-05-27 LAB — POCT URINALYSIS DIPSTICK
BILIRUBIN UA: NEGATIVE
Blood, UA: NEGATIVE
GLUCOSE UA: NEGATIVE
KETONES UA: NEGATIVE
Leukocytes, UA: NEGATIVE
Nitrite, UA: NEGATIVE
Protein, UA: NEGATIVE
SPEC GRAV UA: 1.01
Urobilinogen, UA: NEGATIVE
pH, UA: 6

## 2015-05-27 LAB — PATHOLOGIST SMEAR REVIEW

## 2015-05-27 LAB — AST

## 2015-05-27 LAB — CREATININE, SERUM

## 2015-05-27 LAB — ALT

## 2015-05-27 NOTE — Telephone Encounter (Signed)
Pt has been seen in office since call. 

## 2015-05-27 NOTE — Progress Notes (Signed)
Subjective:    Erin Ruiz is a 25 y.o. female being seen today for her obstetrical visit. She is at 2945w3d gestation. Patient reports backache, fatigue, no bleeding, no cramping, no leaking and occasional contractions. Fetal movement: normal.  Problem List Items Addressed This Visit    None    Visit Diagnoses    Encounter for supervision of other normal pregnancy in third trimester    -  Primary    Relevant Orders    POCT urinalysis dipstick (Completed)    Creatinine clearance, urine, 24 hour    Protein, urine, 24 hour    US MFM OB COMP + 14 WK    AMB referral to maternal fetal medicine    Proteinuria affecting pregnancy in third trimester, antepartum        Relevant Orders    POCT urinalysis dipstick (Completed)    Creatinine clearance, urine, 24 hour    Protein, urine, 24 hour    US MFM OB COMP + 14 WK    AMB referral to maternal fetal medicine    Elevated blood pressure complicating pregnancy, antepartum, third trimester        Relevant Orders    POCT urinalysis dipstick (Completed)    Creatinine clearance, urine, 24 hour    Protein, urine, 24 hour    US MFM OB COMP + 14 WK    AMB referral to maternal fetal medicine      Patient Active Problem List   Diagnosis Date Noted  . Encounter for supervision of other normal pregnancy in first trimester 12/02/2014   Objective:    BP 152/76 mmHg  Pulse 118  Wt 219 lb (99.338 kg)  LMP 09/14/2014 (Approximate) FHT:  135 BPM  Uterine Size: size equals dates  Presentation: cephalic     Assessment:    Pregnancy @ 6245w3d weeks   Elevated systolic pressure today  Proteinuria   Plan:   Remainder of PIH labs completed and sent   labs reviewed, problem list updated Consent signed. GBS results reviewed TDAP offered  Rhogam given for RH negative Pediatrician: discussed. Infant feeding: plans to breastfeed. Maternity leave: N/A. Cigarette smoking: never smoked. Orders Placed This Encounter  Procedures  . US MFM OB COMP  + 14 WK    Standing Status: Future     Number of Occurrences:      Standing Expiration Date: 07/26/2016    Order Specific Question:  Reason for Exam (SYMPTOM  OR DIAGNOSIS REQUIRED)    Answer:  elevated systolic pressures, proteinuria    Order Specific Question:  Preferred imaging location?    Answer:  [facility]  . Creatinine clearance, urine, 24 hour    Standing Status: Future     Number of Occurrences: 1     Standing Expiration Date: 05/26/2016  . Protein, urine, 24 hour    Standing Status: Future     Number of Occurrences: 1     Standing Expiration Date: 05/26/2016  . AMB referral to maternal fetal medicine    Referral Priority:  Urgent    Referral Type:  Consultation    Referral Reason:  Specialty Services Required    Number of Visits Requested:  1  . POCT urinalysis dipstick   No orders of the defined types were placed in this encounter.   Follow up on Monday for elevated blood pressures. Precautions given for over the weekend d/t elevated blood pressures.

## 2015-05-28 LAB — CREATININE CLEARANCE, URINE, 24 HOUR
CREATININE: 0.59 mg/dL (ref 0.57–1.00)
Creatinine Clearance: 29 mL/min — ABNORMAL LOW (ref 88–128)
Creatinine, 24H Ur: 247 mg/24 hr — ABNORMAL LOW (ref 800–1800)
Creatinine, Urine: 41.1 mg/dL
GFR, EST AFRICAN AMERICAN: 149 mL/min/{1.73_m2} (ref >59)
GFR, EST NON AFRICAN AMERICAN: 130 mL/min/{1.73_m2} (ref >59)

## 2015-05-28 LAB — PROTEIN, URINE, 24 HOUR
PROTEIN UR: 6.4 mg/dL
Protein, 24H Urine: 192 mg/24 hr — ABNORMAL HIGH (ref 30.0–150.0)

## 2015-05-30 ENCOUNTER — Ambulatory Visit (INDEPENDENT_AMBULATORY_CARE_PROVIDER_SITE_OTHER): Payer: Medicaid Other | Admitting: Obstetrics

## 2015-05-30 VITALS — BP 125/85 | HR 120 | Temp 98.2°F | Wt 220.4 lb

## 2015-05-30 DIAGNOSIS — Z3493 Encounter for supervision of normal pregnancy, unspecified, third trimester: Secondary | ICD-10-CM

## 2015-05-30 LAB — POCT URINALYSIS DIPSTICK
Bilirubin, UA: NEGATIVE
Glucose, UA: NEGATIVE
Ketones, UA: NEGATIVE
LEUKOCYTES UA: NEGATIVE
NITRITE UA: NEGATIVE
PH UA: 6
PROTEIN UA: NEGATIVE
RBC UA: NEGATIVE
Spec Grav, UA: 1.01
UROBILINOGEN UA: NEGATIVE

## 2015-05-30 LAB — LACTATE DEHYDROGENASE

## 2015-05-30 NOTE — Progress Notes (Signed)
Patient states she is doing some better- some hot flushes, some irregular contractions. Still uncomfortable at night- hand pain at night that wakes her up.

## 2015-05-31 ENCOUNTER — Encounter (HOSPITAL_COMMUNITY): Payer: Self-pay | Admitting: *Deleted

## 2015-05-31 ENCOUNTER — Inpatient Hospital Stay (HOSPITAL_COMMUNITY)
Admission: AD | Admit: 2015-05-31 | Discharge: 2015-06-02 | DRG: 775 | Disposition: A | Discharge status: Home / Self Care | Payer: Medicaid Other | Source: Ambulatory Visit | Attending: Obstetrics | Admitting: Obstetrics

## 2015-05-31 ENCOUNTER — Inpatient Hospital Stay (HOSPITAL_COMMUNITY)
Admission: RE | Admit: 2015-05-31 | Discharge: 2015-05-31 | Disposition: A | Discharge status: Home / Self Care | Payer: Medicaid Other | Source: Ambulatory Visit | Attending: Certified Nurse Midwife | Admitting: Certified Nurse Midwife

## 2015-05-31 ENCOUNTER — Ambulatory Visit (HOSPITAL_COMMUNITY)
Admission: RE | Admit: 2015-05-31 | Discharge: 2015-05-31 | Disposition: A | Discharge status: Home / Self Care | Payer: Medicaid Other | Source: Ambulatory Visit | Attending: Certified Nurse Midwife | Admitting: Certified Nurse Midwife

## 2015-05-31 ENCOUNTER — Other Ambulatory Visit: Payer: Self-pay | Admitting: Certified Nurse Midwife

## 2015-05-31 ENCOUNTER — Encounter (HOSPITAL_COMMUNITY): Payer: Self-pay

## 2015-05-31 DIAGNOSIS — Z6839 Body mass index (BMI) 39.0-39.9, adult: Secondary | ICD-10-CM | POA: Diagnosis not present

## 2015-05-31 DIAGNOSIS — Z3A37 37 weeks gestation of pregnancy: Secondary | ICD-10-CM | POA: Diagnosis not present

## 2015-05-31 DIAGNOSIS — O99214 Obesity complicating childbirth: Secondary | ICD-10-CM | POA: Diagnosis present

## 2015-05-31 DIAGNOSIS — O1213 Gestational proteinuria, third trimester: Secondary | ICD-10-CM

## 2015-05-31 DIAGNOSIS — Z8249 Family history of ischemic heart disease and other diseases of the circulatory system: Secondary | ICD-10-CM | POA: Diagnosis not present

## 2015-05-31 DIAGNOSIS — E669 Obesity, unspecified: Secondary | ICD-10-CM | POA: Diagnosis present

## 2015-05-31 DIAGNOSIS — Z87891 Personal history of nicotine dependence: Secondary | ICD-10-CM | POA: Diagnosis not present

## 2015-05-31 DIAGNOSIS — O163 Unspecified maternal hypertension, third trimester: Secondary | ICD-10-CM

## 2015-05-31 DIAGNOSIS — O1490 Unspecified pre-eclampsia, unspecified trimester: Secondary | ICD-10-CM | POA: Diagnosis present

## 2015-05-31 DIAGNOSIS — O3663X0 Maternal care for excessive fetal growth, third trimester, not applicable or unspecified: Secondary | ICD-10-CM | POA: Diagnosis present

## 2015-05-31 DIAGNOSIS — Z3689 Encounter for other specified antenatal screening: Secondary | ICD-10-CM

## 2015-05-31 DIAGNOSIS — R030 Elevated blood-pressure reading, without diagnosis of hypertension: Secondary | ICD-10-CM | POA: Diagnosis present

## 2015-05-31 DIAGNOSIS — Z3483 Encounter for supervision of other normal pregnancy, third trimester: Secondary | ICD-10-CM

## 2015-05-31 DIAGNOSIS — O99344 Other mental disorders complicating childbirth: Secondary | ICD-10-CM | POA: Diagnosis present

## 2015-05-31 DIAGNOSIS — F329 Major depressive disorder, single episode, unspecified: Secondary | ICD-10-CM | POA: Diagnosis present

## 2015-05-31 DIAGNOSIS — O134 Gestational [pregnancy-induced] hypertension without significant proteinuria, complicating childbirth: Principal | ICD-10-CM | POA: Diagnosis present

## 2015-05-31 DIAGNOSIS — O133 Gestational [pregnancy-induced] hypertension without significant proteinuria, third trimester: Secondary | ICD-10-CM | POA: Diagnosis present

## 2015-05-31 DIAGNOSIS — Z825 Family history of asthma and other chronic lower respiratory diseases: Secondary | ICD-10-CM

## 2015-05-31 LAB — URINALYSIS, ROUTINE W REFLEX MICROSCOPIC
Bilirubin Urine: NEGATIVE
GLUCOSE, UA: NEGATIVE mg/dL
Hgb urine dipstick: NEGATIVE
KETONES UR: NEGATIVE mg/dL
Nitrite: NEGATIVE
PH: 5.5 (ref 5.0–8.0)
Protein, ur: NEGATIVE mg/dL
SPECIFIC GRAVITY, URINE: 1.01 (ref 1.005–1.030)

## 2015-05-31 LAB — URINE MICROSCOPIC-ADD ON: RBC / HPF: NONE SEEN RBC/hpf (ref 0–5)

## 2015-05-31 LAB — CBC
HCT: 34.6 % — ABNORMAL LOW (ref 36.0–46.0)
HEMOGLOBIN: 11.6 g/dL — AB (ref 12.0–15.0)
MCH: 28.8 pg (ref 26.0–34.0)
MCHC: 33.5 g/dL (ref 30.0–36.0)
MCV: 85.9 fL (ref 78.0–100.0)
PLATELETS: 153 10*3/uL (ref 150–400)
RBC: 4.03 MIL/uL (ref 3.87–5.11)
RDW: 14.1 % (ref 11.5–15.5)
WBC: 9.4 10*3/uL (ref 4.0–10.5)

## 2015-05-31 LAB — TYPE AND SCREEN
ABO/RH(D): A POS
Antibody Screen: NEGATIVE

## 2015-05-31 LAB — COMPREHENSIVE METABOLIC PANEL
ALBUMIN: 2.8 g/dL — AB (ref 3.5–5.0)
ALT: 14 U/L (ref 14–54)
ANION GAP: 9 (ref 5–15)
AST: 22 U/L (ref 15–41)
Alkaline Phosphatase: 168 U/L — ABNORMAL HIGH (ref 38–126)
BUN: 6 mg/dL (ref 6–20)
CALCIUM: 8.9 mg/dL (ref 8.9–10.3)
CHLORIDE: 107 mmol/L (ref 101–111)
CO2: 21 mmol/L — AB (ref 22–32)
Creatinine, Ser: 0.45 mg/dL (ref 0.44–1.00)
GFR calc non Af Amer: >60 mL/min (ref >60)
GLUCOSE: 92 mg/dL (ref 65–99)
POTASSIUM: 3.9 mmol/L (ref 3.5–5.1)
SODIUM: 137 mmol/L (ref 135–145)
Total Bilirubin: 0.3 mg/dL (ref 0.3–1.2)
Total Protein: 6.5 g/dL (ref 6.5–8.1)

## 2015-05-31 LAB — PROTEIN / CREATININE RATIO, URINE: Creatinine, Urine: 28 mg/dL

## 2015-05-31 LAB — ABO/RH: ABO/RH(D): A POS

## 2015-05-31 MED ORDER — DIPHENHYDRAMINE HCL 50 MG/ML IJ SOLN: 12.5000 mg | INTRAMUSCULAR | Status: DC | PRN

## 2015-05-31 MED ORDER — OXYTOCIN 10 UNIT/ML IJ SOLN
1.0000 m[IU]/min | INTRAVENOUS | Status: DC
  Administered 2015-06-01: 2 m[IU]/min via INTRAVENOUS

## 2015-05-31 MED ORDER — OXYCODONE-ACETAMINOPHEN 5-325 MG PO TABS: 1.0000 | ORAL_TABLET | ORAL | Status: DC | PRN

## 2015-05-31 MED ORDER — FLEET ENEMA 7-19 GM/118ML RE ENEM: 1.0000 | ENEMA | RECTAL | Status: DC | PRN

## 2015-05-31 MED ORDER — CITRIC ACID-SODIUM CITRATE 334-500 MG/5ML PO SOLN: 30.0000 mL | ORAL | Status: DC | PRN

## 2015-05-31 MED ORDER — EPHEDRINE 5 MG/ML INJ
10.0000 mg | INTRAVENOUS | Status: DC | PRN
  Administered 2015-06-01: 10 mg via INTRAVENOUS
  Filled 2015-05-31: qty 4
  Filled 2015-05-31: qty 2

## 2015-05-31 MED ORDER — TERBUTALINE SULFATE 1 MG/ML IJ SOLN
0.2500 mg | Freq: Once | INTRAMUSCULAR | Status: DC | PRN
  Filled 2015-05-31: qty 1

## 2015-05-31 MED ORDER — MISOPROSTOL 200 MCG PO TABS
50.0000 ug | ORAL_TABLET | ORAL | Status: DC | PRN
  Administered 2015-05-31: 50 ug via ORAL
  Filled 2015-05-31: qty 0.5

## 2015-05-31 MED ORDER — OXYCODONE-ACETAMINOPHEN 5-325 MG PO TABS: 2.0000 | ORAL_TABLET | ORAL | Status: DC | PRN

## 2015-05-31 MED ORDER — BUPROPION HCL ER (SR) 150 MG PO TB12
150.0000 mg | ORAL_TABLET | Freq: Two times a day (BID) | ORAL | Status: DC
  Administered 2015-05-31: 150 mg via ORAL
  Filled 2015-05-31 (×2): qty 1

## 2015-05-31 MED ORDER — NALBUPHINE HCL 10 MG/ML IJ SOLN
10.0000 mg | INTRAMUSCULAR | Status: DC | PRN
Start: 2015-05-31 — End: 2015-06-01
  Administered 2015-05-31 – 2015-06-01 (×2): 10 mg via INTRAVENOUS
  Filled 2015-05-31 (×2): qty 1

## 2015-05-31 MED ORDER — LACTATED RINGERS IV SOLN
INTRAVENOUS | Status: DC
  Administered 2015-05-31 – 2015-06-01 (×3): via INTRAVENOUS

## 2015-05-31 MED ORDER — LACTATED RINGERS IV SOLN: 500.0000 mL | Freq: Once | INTRAVENOUS | Status: DC

## 2015-05-31 MED ORDER — PHENYLEPHRINE 40 MCG/ML (10ML) SYRINGE FOR IV PUSH (FOR BLOOD PRESSURE SUPPORT)
80.0000 ug | PREFILLED_SYRINGE | INTRAVENOUS | Status: DC | PRN
  Administered 2015-06-01 (×2): 80 ug via INTRAVENOUS
  Filled 2015-05-31: qty 2

## 2015-05-31 MED ORDER — FENTANYL 2.5 MCG/ML BUPIVACAINE 1/10 % EPIDURAL INFUSION (WH - ANES)
14.0000 mL/h | INTRAMUSCULAR | Status: DC | PRN
  Administered 2015-06-01 (×2): 14 mL/h via EPIDURAL
  Filled 2015-05-31: qty 125

## 2015-05-31 MED ORDER — MISOPROSTOL 25 MCG QUARTER TABLET
25.0000 ug | ORAL_TABLET | ORAL | Status: DC | PRN
  Administered 2015-05-31: 25 ug via VAGINAL
  Filled 2015-05-31: qty 0.25

## 2015-05-31 MED ORDER — LIDOCAINE HCL (PF) 1 % IJ SOLN
30.0000 mL | INTRAMUSCULAR | Status: DC | PRN
  Filled 2015-05-31: qty 30

## 2015-05-31 MED ORDER — ACETAMINOPHEN 325 MG PO TABS: 650.0000 mg | ORAL_TABLET | ORAL | Status: DC | PRN

## 2015-05-31 MED ORDER — LACTATED RINGERS IV SOLN
500.0000 mL | INTRAVENOUS | Status: DC | PRN
  Administered 2015-06-01: 500 mL via INTRAVENOUS

## 2015-05-31 MED ORDER — SODIUM CHLORIDE 0.9 % IV SOLN: 14.0000 mL/h | INTRAVENOUS | Status: DC | PRN

## 2015-05-31 MED ORDER — PHENYLEPHRINE 40 MCG/ML (10ML) SYRINGE FOR IV PUSH (FOR BLOOD PRESSURE SUPPORT): 80.0000 ug | PREFILLED_SYRINGE | INTRAVENOUS | Status: DC | PRN

## 2015-05-31 MED ORDER — PHENYLEPHRINE 40 MCG/ML (10ML) SYRINGE FOR IV PUSH (FOR BLOOD PRESSURE SUPPORT)
80.0000 ug | PREFILLED_SYRINGE | INTRAVENOUS | Status: DC | PRN
  Administered 2015-06-01: 80 ug via INTRAVENOUS
  Filled 2015-05-31: qty 2
  Filled 2015-05-31: qty 20

## 2015-05-31 MED ORDER — OXYTOCIN BOLUS FROM INFUSION: 500.0000 mL | INTRAVENOUS | Status: DC

## 2015-05-31 MED ORDER — ZOLPIDEM TARTRATE 5 MG PO TABS: 5.0000 mg | ORAL_TABLET | Freq: Every evening | ORAL | Status: DC | PRN

## 2015-05-31 MED ORDER — EPHEDRINE 5 MG/ML INJ
10.0000 mg | INTRAVENOUS | Status: DC | PRN
  Administered 2015-06-01: 10 mg via INTRAVENOUS
  Filled 2015-05-31: qty 2

## 2015-05-31 MED ORDER — EPHEDRINE 5 MG/ML INJ: 10.0000 mg | INTRAVENOUS | Status: DC | PRN

## 2015-05-31 MED ORDER — OXYTOCIN 10 UNIT/ML IJ SOLN
2.5000 [IU]/h | INTRAVENOUS | Status: DC
  Filled 2015-05-31: qty 10

## 2015-05-31 MED ORDER — LACTATED RINGERS IV SOLN
500.0000 mL | Freq: Once | INTRAVENOUS | Status: AC
  Administered 2015-06-01: 500 mL via INTRAVENOUS

## 2015-05-31 MED ORDER — ONDANSETRON HCL 4 MG/2ML IJ SOLN
4.0000 mg | Freq: Four times a day (QID) | INTRAMUSCULAR | Status: DC | PRN
  Administered 2015-06-01: 4 mg via INTRAVENOUS
  Filled 2015-05-31: qty 2

## 2015-05-31 NOTE — MAU Note (Signed)
[facility]

## 2015-05-31 NOTE — H&P (Signed)
Erin Ruiz is a 25 y.o. female presenting for IOL for elevated blood pressures at term. History OB History    Gravida Para Term Preterm AB TAB SAB Ectopic Multiple Living   3 2 2       2      Past Medical History  Diagnosis Date  . Heart murmur     childhood  . Depression    Past Surgical History  Procedure Laterality Date  . Tonsillectomy  2004   Family History: family history includes Asthma in her father; Hypertension in her mother. Social History:  reports that she has quit smoking. She does not have any smokeless tobacco history on file. She reports that she does not drink alcohol or use illicit drugs.   Prenatal Transfer Tool  Maternal Diabetes: No Genetic Screening: Normal Maternal Ultrasounds/Referrals: Normal Fetal Ultrasounds or other Referrals:  Referred to Materal Fetal Medicine  Maternal Substance Abuse:  No Significant Maternal Medications:  None Significant Maternal Lab Results:  None Other Comments:  None  ROS  Dilation: 3 Effacement (%): 50 Station: -3 Exam by:: L. Cresenzo, Rn Blood pressure 115/68, pulse 90, temperature 98.3 F (36.8 C), temperature source Oral, resp. rate 18, height 5\' 3"  (1.6 m), weight 220 lb (99.791 kg), last menstrual period 09/14/2014, currently breastfeeding. Exam Physical Exam  Prenatal labs: ABO, Rh: --/--/A POS, A POS (03/28 1525) Antibody: NEG (03/28 1525) Rubella: 1.38 (09/29 1134) RPR: NON REAC (01/27 1002)  HBsAg: NEGATIVE (09/29 1134)  HIV: NONREACTIVE (01/27 1002)  GBS: NOT DETECTED (03/10 1038)   Assessment/Plan: Admit for elevated blood pressures: preeclampsia per MFM 05/29/15 Dr. Vincenza HewsQuinn.     Erin Ruiz 05/31/2015, 6:35 PM

## 2015-05-31 NOTE — MAU Provider Note (Signed)
Chief Complaint:  No chief complaint on file.   First Provider Initiated Contact with Patient 05/31/15 1307     HPI: Erin Ruiz is a 25 y.o. G3P2002 at [redacted]w[redacted]d who was sent to maternity admissions from [facility] for elevated BP (148/81) and preeclampsia workup. Had elevated BP of 152/76 on 05/27/15.   Had growth [place] today showing EFW 7-12 (90%tile) and AC >97%tile.   Associated signs and symptoms: Pos for occassional mild HA's. No recent worsening or need for HA meds. No HA now. Neg for scotoma, epigastric pain, LOF VB, abd pain. Good fetal movement.   Past Medical History: Past Medical History  Diagnosis Date  . Heart murmur     childhood  . Depression     Past obstetric history: OB History  Gravida Para Term Preterm AB SAB TAB Ectopic Multiple Living  # Outcome Date GA Lbr Len/2nd Weight Sex Delivery Anes PTL Lv  3 Current           2 Term 07/30/13 [redacted]w[redacted]d 08:00 / 00:24 7 lb 9 oz (3.43 kg) F Vag-Spont EPI  Y  1 Term 12/30/09 [redacted]w[redacted]d  7 lb 1 oz (3.204 kg) F Vag-Spont None  Y     Comments: No complications      Past Surgical History: Past Surgical History  Procedure Laterality Date  . Tonsillectomy  2004     Family History: Family History  Problem Relation Age of Onset  . Hypertension Mother   . Asthma Father     Social History: Social History  Substance Use Topics  . Smoking status: Former Games developer  . Smokeless tobacco: None  . Alcohol Use: No    Allergies: No Known Allergies  Meds:  Prescriptions prior to admission  Medication Sig Dispense Refill Last Dose  . buPROPion (WELLBUTRIN SR) 150 MG 12 hr tablet Take 1 tablet (150 mg total) by mouth 2 (two) times daily. 60 tablet 2 05/30/2015 at Unknown time  . cyclobenzaprine (FLEXERIL) 10 MG tablet Take 1 tablet (10 mg total) by mouth every 8 (eight) hours as needed for muscle spasms. 30 tablet 1 Past Week at Unknown time  . Prenat-Fe Poly-Methfol-FA-DHA (VITAFOL ULTRA) 29-0.6-0.4-200 MG CAPS Take 1  capsule by mouth at bedtime.   Past Week at Unknown time  . butalbital-acetaminophen-caffeine (FIORICET, ESGIC) 50-325-40 MG tablet Take 2 tablets by mouth every 6 (six) hours as needed for headache. (Patient not taking: Reported on 03/03/2015) 40 tablet 0 More than a month at Unknown time    I have reviewed patient's Past Medical Hx, Surgical Hx, Family Hx, Social Hx, medications and allergies.   ROS:  Review of Systems  Constitutional: Negative for fever and chills.  Eyes: Negative for visual disturbance.  Cardiovascular: Positive for leg swelling.  Gastrointestinal: Negative for abdominal pain.  Genitourinary: Negative for vaginal bleeding and vaginal discharge.  Neurological: Negative for headaches.    Physical Exam  Patient Vitals for the past 24 hrs:  BP Pulse  05/31/15 1400 111/63 mmHg 100  05/31/15 1345 121/73 mmHg 104  05/31/15 1330 114/68 mmHg 104  05/31/15 1315 117/67 mmHg 108  05/31/15 1300 130/81 mmHg 108   Constitutional: Well-developed, well-nourished female in no acute distress.  Cardiovascular: normal Mild tachycardia Respiratory: normal effort GI: Abd soft, non-tender, gravid S>D MS: Extremities nontender, 1+ edema, normal ROM Neurologic: Alert and oriented x 4. DTRs 2+, no clonus GU:  Pelvic: NEFG, physiologic discharge, no blood.  Dilation: 3 Effacement (%): Thick Cervical Position: Posterior Presentation: Vertex Exam by:: Ivonne AndrewV. Vearl Aitken CNM  FHT:  Baseline 150 , moderate variability, accelerations present, no decelerations Contractions: None   Labs: Results for orders placed or performed during the hospital encounter of 05/31/15 (from the past 24 hour(s))  Protein / creatinine ratio, urine     Status: None   Collection Time: 05/31/15 12:40 PM  Result Value Ref Range   Creatinine, Urine 28.00 mg/dL   Total Protein, Urine <6 mg/dL   Protein Creatinine Ratio        0.00 - 0.15 mg/mg[Cre]  Urinalysis, Routine w reflex microscopic ([facility])     Status:  Abnormal   Collection Time: 05/31/15 12:40 PM  Result Value Ref Range   Color, Urine YELLOW YELLOW   APPearance CLEAR CLEAR   Specific Gravity, Urine 1.010 1.005 - 1.030   pH 5.5 5.0 - 8.0   Glucose, UA NEGATIVE NEGATIVE mg/dL   Hgb urine dipstick NEGATIVE NEGATIVE   Bilirubin Urine NEGATIVE NEGATIVE   Ketones, ur NEGATIVE NEGATIVE mg/dL   Protein, ur NEGATIVE NEGATIVE mg/dL   Nitrite NEGATIVE NEGATIVE   Leukocytes, UA MODERATE (A) NEGATIVE  Urine microscopic-add on     Status: Abnormal   Collection Time: 05/31/15 12:40 PM  Result Value Ref Range   Squamous Epithelial / LPF 0-5 (A) NONE SEEN   WBC, UA 6-30 0 - 5 WBC/hpf   RBC / HPF NONE SEEN 0 - 5 RBC/hpf   Bacteria, UA FEW (A) NONE SEEN  CBC     Status: Abnormal   Collection Time: 05/31/15 12:49 PM  Result Value Ref Range   WBC 9.4 4.0 - 10.5 K/uL   RBC 4.03 3.87 - 5.11 MIL/uL   Hemoglobin 11.6 (L) 12.0 - 15.0 g/dL   HCT 81.134.6 (L) 91.436.0 - 78.246.0 %   MCV 85.9 78.0 - 100.0 fL   MCH 28.8 26.0 - 34.0 pg   MCHC 33.5 30.0 - 36.0 g/dL   RDW 95.614.1 21.311.5 - 08.615.5 %   Platelets 153 150 - 400 K/uL  Comprehensive metabolic panel     Status: Abnormal   Collection Time: 05/31/15 12:49 PM  Result Value Ref Range   Sodium 137 135 - 145 mmol/L   Potassium 3.9 3.5 - 5.1 mmol/L   Chloride 107 101 - 111 mmol/L   CO2 21 (L) 22 - 32 mmol/L   Glucose, Bld 92 65 - 99 mg/dL   BUN 6 6 - 20 mg/dL   Creatinine, Ser 5.780.45 0.44 - 1.00 mg/dL   Calcium 8.9 8.9 - 46.910.3 mg/dL   Total Protein 6.5 6.5 - 8.1 g/dL   Albumin 2.8 (L) 3.5 - 5.0 g/dL   AST 22 15 - 41 U/L   ALT 14 14 - 54 U/L   Alkaline Phosphatase 168 (H) 38 - 126 U/L   Total Bilirubin 0.3 0.3 - 1.2 mg/dL   GFR calc non Af Amer >60 >60 mL/min   GFR calc Af Amer >60 >60 mL/min   Anion gap 9 5 - 15    Imaging:  [place] Mfm Ob Comp + 14 Wk  05/31/2015  OBSTETRICAL ULTRASOUND: This exam was performed within a [facility]. The OB US report was generated in the AS system, and faxed  to the ordering physician.  This report is available in the [employer] PACS. See the AS Obstetric US report via the Image Link.   MAU Course: CBC, CMET, UA, P:C, cycle BPs.  Labs and BP normal in MAU.  Discussed Hx, exam, labs, VE and Dr. Elise Benne (MFM) recommendations w/ Dr. Clearance Coots. Pt meets criteria for GHTN. Will induce.   MDM: - GHTN at term. No evidence of Pre-E. - Suspected fetal macrosomia (7-12). Pt proven to 7-9--easy labors and deliveries w/ both babies.   Assessment: 1. Labor: IOL 2. Fetal Wellbeing: Category I  3. Pain Control: None 4. GBS: Neg 5. 37.0 week IUP 6. Gestational hypertension w/o significant proteinuria in 3rd trimester  7. Suspected fetal macrosomia  Plan:  1. Admit to BS per consult with Dr. Clearance Coots 2. Routine L&D orders 3. Analgesia/anesthesia PRN  4. Cytotec IOL 4. Watch labor curve closely.   [place], [place] 05/31/2015 2:53 PM

## 2015-05-31 NOTE — H&P (Signed)
Erin Ruiz is a 25 y.o. female presenting for fetal assessment at [facility] for H/O Gestational Hypertension.  [facility] and had borderline BP's.  Sent to triage for further BP's and labs. Maternal Medical History:  Reason for admission: Elevated blood pressures.  Fetal activity: Perceived fetal activity is normal.   Last perceived fetal movement was within the past hour.    Prenatal complications: PIH.     OB History    Gravida Para Term Preterm AB TAB SAB Ectopic Multiple Living   3 2 2       2      Past Medical History  Diagnosis Date  . Heart murmur     childhood  . Depression    Past Surgical History  Procedure Laterality Date  . Tonsillectomy  2004   Family History: family history includes Asthma in her father; Hypertension in her mother. Social History:  reports that she has quit smoking. She does not have any smokeless tobacco history on file. She reports that she does not drink alcohol or use illicit drugs.   Prenatal Transfer Tool  Maternal Diabetes: No Genetic Screening: Normal Maternal Ultrasounds/Referrals: Normal Fetal Ultrasounds or other Referrals:  Referred to Materal Fetal Medicine  Maternal Substance Abuse:  No Significant Maternal Medications:  None Significant Maternal Lab Results:  Lab values include: Group B Strep negative Other Comments:  None  Review of Systems  All other systems reviewed and are negative.   Dilation: 3 Effacement (%): Thick Exam by:: Ivonne AndrewV. [name] CNM Blood pressure 111/63, pulse 100, last menstrual period 09/14/2014, currently breastfeeding. Maternal Exam:  Abdomen: Patient reports no abdominal tenderness. Fetal presentation: vertex  Introitus: Normal vulva. Normal vagina.  Cervix: Cervix evaluated by digital exam.     Physical Exam  Nursing note and vitals reviewed. Constitutional: She is oriented to person, place, and time. She appears well-developed and well-nourished.  HENT:   Head: Normocephalic and atraumatic.  Eyes: Conjunctivae are normal. Pupils are equal, round, and reactive to light.  Neck: Normal range of motion. Neck supple.  Cardiovascular: Normal rate and regular rhythm.   Respiratory: Effort normal and breath sounds normal.  GI: Soft. Bowel sounds are normal.  Genitourinary: Vagina normal and uterus normal.  Musculoskeletal: Normal range of motion.  Neurological: She is alert and oriented to person, place, and time.  Skin: Skin is warm and dry.  Psychiatric: She has a normal mood and affect. Her behavior is normal. Judgment and thought content normal.    Prenatal labs: ABO, Rh: A/POS/-- (09/29 1134) Antibody: NEG (09/29 1134) Rubella: 1.38 (09/29 1134) RPR: NON REAC (01/27 1002)  HBsAg: NEGATIVE (09/29 1134)  HIV: NONREACTIVE (01/27 1002)  GBS: NOT DETECTED (03/10 1038)   Assessment/Plan: 37 weeks.  Borderline elevated BP's . Discussed with Dr. Vincenza HewsQuinn and patient meets criteria for induction of labor for Gestational Hypertension > 37 weeks.  Will admit for 2 stage IOL.  [name],[name] 05/31/2015, 2:44 PM

## 2015-05-31 NOTE — Progress Notes (Signed)
Dr Clearance CootsHarper notified regarding contraction pattern. Order to hold Cytotec for now. Patient may eat. Reassess in a couple of hours. Start Pitocin if contraction pattern doesn't settle down. Patient made aware of POC.

## 2015-05-31 NOTE — Progress Notes (Signed)
[facility] ultrasound  Indication: 25 yr old G3P2002 at 5269w0d with borderline blood pressures for fetal anatomic survey.  Findings: 1. Single intrauterine pregnancy. 2. Estimated fetal weight is in the >90th%; the abdominal circumference is in the >97th%. 3. Anterior placenta without evidence of previa. 4. Normal amniotic fluid index. 5. The anatomy survey is limited as above by advanced gestational age; no abnormalities seen.  Recommendations: 1. Fetal macrosomia: - had normal 2 hour gtt - there is an increased risk of shoulder dystocia with macrosomia and accelerated abdominal circumference - recommend adherence to the labor curve and caution in the second stage 2. Normal limited fetal anatomic survey 3. Normal quad screen. 4. Borderline blood pressures: - patient had negative 24 hour urine and normal labs on 3/22 - had one mild range blood pressure on 3/22 and today BPs are 130/80 and 148/78 - patient reports occasional headaches - recommend patient go to MAU for labs (CMP, CBC) and serial blood pressures- if BPs remain in mild range patient would meet criteria for gestational hypertension and I recommend delivery given >37w - if has any abnormal labs recommend delivery if meets criteria for preeclampsia - if meets criteria for severe gestational hypertension (severe range blood pressures) or severe preeclampsia (abnormal LFTs, creatinine, or platelets) recommend delivery and magensium sulfate until 24 hours postpartum  Discussed with Dr. Clearance CootsHarper. Patient sent to MAU. Please call with questions.  Eulis FosterKristen Pallas Wahlert, MD

## 2015-05-31 NOTE — ED Notes (Signed)
Rechecked BP. Reported to Dr. Vincenza HewsQuinn, 148-78BP, HR 104.  Dr. Vincenza HewsQuinn spoke with Dr. Clearance CootsHarper and Pt.  Pt transferred to MAU for further evaluation.

## 2015-06-01 ENCOUNTER — Inpatient Hospital Stay (HOSPITAL_COMMUNITY): Payer: Medicaid Other | Admitting: Anesthesiology

## 2015-06-01 ENCOUNTER — Encounter (HOSPITAL_COMMUNITY): Payer: Self-pay

## 2015-06-01 DIAGNOSIS — O1490 Unspecified pre-eclampsia, unspecified trimester: Secondary | ICD-10-CM | POA: Diagnosis present

## 2015-06-01 HISTORY — DX: Unspecified pre-eclampsia, unspecified trimester: O14.90

## 2015-06-01 LAB — CBC
HEMATOCRIT: 35.1 % — AB (ref 36.0–46.0)
HEMOGLOBIN: 11.7 g/dL — AB (ref 12.0–15.0)
MCH: 28.7 pg (ref 26.0–34.0)
MCHC: 33.3 g/dL (ref 30.0–36.0)
MCV: 86.2 fL (ref 78.0–100.0)
Platelets: 179 10*3/uL (ref 150–400)
RBC: 4.07 MIL/uL (ref 3.87–5.11)
RDW: 14.1 % (ref 11.5–15.5)
WBC: 11.6 10*3/uL — ABNORMAL HIGH (ref 4.0–10.5)

## 2015-06-01 LAB — RPR: RPR: NONREACTIVE

## 2015-06-01 MED ORDER — PRENATAL MULTIVITAMIN CH
1.0000 | ORAL_TABLET | Freq: Every day | ORAL | Status: DC
Start: 2015-06-01 — End: 2015-06-02
  Administered 2015-06-01: 1 via ORAL
  Filled 2015-06-01: qty 1

## 2015-06-01 MED ORDER — DIPHENHYDRAMINE HCL 25 MG PO CAPS: 25.0000 mg | ORAL_CAPSULE | Freq: Four times a day (QID) | ORAL | Status: DC | PRN

## 2015-06-01 MED ORDER — MEASLES, MUMPS & RUBELLA VAC ~~LOC~~ INJ: 0.5000 mL | INJECTION | Freq: Once | SUBCUTANEOUS | Status: DC

## 2015-06-01 MED ORDER — OXYTOCIN 10 UNIT/ML IJ SOLN: 1.0000 m[IU]/min | INTRAVENOUS | Status: DC

## 2015-06-01 MED ORDER — LANOLIN HYDROUS EX OINT: TOPICAL_OINTMENT | CUTANEOUS | Status: DC | PRN

## 2015-06-01 MED ORDER — IBUPROFEN 600 MG PO TABS
600.0000 mg | ORAL_TABLET | Freq: Four times a day (QID) | ORAL | Status: DC
  Administered 2015-06-01 – 2015-06-02 (×5): 600 mg via ORAL
  Filled 2015-06-01 (×5): qty 1

## 2015-06-01 MED ORDER — FLEET ENEMA 7-19 GM/118ML RE ENEM: 1.0000 | ENEMA | Freq: Every day | RECTAL | Status: DC | PRN

## 2015-06-01 MED ORDER — ONDANSETRON HCL 4 MG/2ML IJ SOLN: 4.0000 mg | INTRAMUSCULAR | Status: DC | PRN

## 2015-06-01 MED ORDER — WITCH HAZEL-GLYCERIN EX PADS: 1.0000 "application " | MEDICATED_PAD | CUTANEOUS | Status: DC | PRN

## 2015-06-01 MED ORDER — BUPROPION HCL ER (SR) 150 MG PO TB12
150.0000 mg | ORAL_TABLET | Freq: Two times a day (BID) | ORAL | Status: DC
  Administered 2015-06-01 – 2015-06-02 (×2): 150 mg via ORAL
  Filled 2015-06-01 (×4): qty 1

## 2015-06-01 MED ORDER — BUTALBITAL-APAP-CAFFEINE 50-325-40 MG PO TABS: 2.0000 | ORAL_TABLET | Freq: Four times a day (QID) | ORAL | Status: DC | PRN

## 2015-06-01 MED ORDER — OXYCODONE-ACETAMINOPHEN 5-325 MG PO TABS: 2.0000 | ORAL_TABLET | ORAL | Status: DC | PRN

## 2015-06-01 MED ORDER — SENNOSIDES-DOCUSATE SODIUM 8.6-50 MG PO TABS
2.0000 | ORAL_TABLET | ORAL | Status: DC
  Administered 2015-06-01: 2 via ORAL
  Filled 2015-06-01: qty 2

## 2015-06-01 MED ORDER — ONDANSETRON HCL 4 MG PO TABS: 4.0000 mg | ORAL_TABLET | ORAL | Status: DC | PRN

## 2015-06-01 MED ORDER — BENZOCAINE-MENTHOL 20-0.5 % EX AERO
1.0000 "application " | INHALATION_SPRAY | CUTANEOUS | Status: DC | PRN
  Filled 2015-06-01: qty 56

## 2015-06-01 MED ORDER — MEDROXYPROGESTERONE ACETATE 150 MG/ML IM SUSP: 150.0000 mg | INTRAMUSCULAR | Status: DC | PRN

## 2015-06-01 MED ORDER — OXYTOCIN 10 UNIT/ML IJ SOLN: 2.5000 [IU]/h | INTRAVENOUS | Status: DC | PRN

## 2015-06-01 MED ORDER — LACTATED RINGERS IV SOLN: 500.0000 mL | Freq: Once | INTRAVENOUS | Status: DC

## 2015-06-01 MED ORDER — METHYLERGONOVINE MALEATE 0.2 MG PO TABS: 0.2000 mg | ORAL_TABLET | ORAL | Status: DC | PRN

## 2015-06-01 MED ORDER — METHYLERGONOVINE MALEATE 0.2 MG/ML IJ SOLN: 0.2000 mg | INTRAMUSCULAR | Status: DC | PRN

## 2015-06-01 MED ORDER — TETANUS-DIPHTH-ACELL PERTUSSIS 5-2.5-18.5 LF-MCG/0.5 IM SUSP: 0.5000 mL | Freq: Once | INTRAMUSCULAR | Status: DC

## 2015-06-01 MED ORDER — OXYCODONE-ACETAMINOPHEN 5-325 MG PO TABS
1.0000 | ORAL_TABLET | ORAL | Status: DC | PRN
  Administered 2015-06-01 – 2015-06-02 (×3): 1 via ORAL
  Filled 2015-06-01 (×3): qty 1

## 2015-06-01 MED ORDER — BISACODYL 10 MG RE SUPP: 10.0000 mg | Freq: Every day | RECTAL | Status: DC | PRN

## 2015-06-01 MED ORDER — VITAFOL ULTRA 29-0.6-0.4-200 MG PO CAPS
1.0000 | ORAL_CAPSULE | Freq: Every day | ORAL | Status: DC
Start: 2015-06-01 — End: 2015-06-01

## 2015-06-01 MED ORDER — ZOLPIDEM TARTRATE 5 MG PO TABS: 5.0000 mg | ORAL_TABLET | Freq: Every evening | ORAL | Status: DC | PRN

## 2015-06-01 MED ORDER — SIMETHICONE 80 MG PO CHEW: 80.0000 mg | CHEWABLE_TABLET | ORAL | Status: DC | PRN

## 2015-06-01 MED ORDER — LIDOCAINE HCL (PF) 1 % IJ SOLN
INTRAMUSCULAR | Status: DC | PRN
  Administered 2015-06-01: 2 mL via EPIDURAL
  Administered 2015-06-01: 5 mL via EPIDURAL
  Administered 2015-06-01: 3 mL via EPIDURAL

## 2015-06-01 MED ORDER — DIBUCAINE 1 % RE OINT: 1.0000 "application " | TOPICAL_OINTMENT | RECTAL | Status: DC | PRN

## 2015-06-01 MED ORDER — ACETAMINOPHEN 325 MG PO TABS
650.0000 mg | ORAL_TABLET | ORAL | Status: DC | PRN
  Administered 2015-06-01 (×2): 650 mg via ORAL
  Filled 2015-06-01 (×2): qty 2

## 2015-06-01 NOTE — Progress Notes (Signed)
Joseph [name] is a 25 y.o. G3P2002 at 7264w1d by LMP admitted for induction of labor due to Hypertension.  Subjective:   Objective: BP 151/70 mmHg  Pulse 115  Temp(Src) 98.1 F (36.7 C) (Oral)  Resp 20  Ht 5\' 3"  (1.6 m)  Wt 220 lb (99.791 kg)  BMI 38.98 kg/m2  SpO2 100%  LMP 09/14/2014 (Approximate)      FHT:  FHR: 135 bpm, variability: moderate,  accelerations:  Present,  decelerations:  Present variable UC:   regular, every 2-3 minutes SVE:   Dilation: 9 Effacement (%): 80 Station: 0 Exam by:: Wandra MannanWanda Osborne Rn  Labs: Lab Results  Component Value Date   WBC 11.6* 06/01/2015   HGB 11.7* 06/01/2015   HCT 35.1* 06/01/2015   MCV 86.2 06/01/2015   PLT 179 06/01/2015    Assessment / Plan: Induction of labor due to gestational hypertension,  progressing well on pitocin  Labor: Progressing normally Preeclampsia:  no signs or symptoms of toxicity and labs stable Fetal Wellbeing:  Category I Pain Control:  Epidural I/D:  n/a Anticipated MOD:  NSVD  Inocencia Murtaugh A 06/01/2015, 9:31 AM

## 2015-06-01 NOTE — Anesthesia Postprocedure Evaluation (Signed)
Anesthesia Post Note  Patient: Erin Ruiz  Procedure(s) Performed: * No procedures listed *  Patient location during evaluation: Mother Baby Anesthesia Type: Epidural Level of consciousness: awake and alert Pain management: pain level controlled Vital Signs Assessment: post-procedure vital signs reviewed and stable Respiratory status: spontaneous breathing, nonlabored ventilation and respiratory function stable Cardiovascular status: stable Postop Assessment: no headache, no backache and epidural receding Anesthetic complications: no    Last Vitals:  Filed Vitals:   06/01/15 1120 06/01/15 1220  BP: 113/53 105/47  Pulse: 101 92  Temp: 36.8 C 37.1 C  Resp: 18 16    Last Pain:  Filed Vitals:   06/01/15 1336  PainSc: 3                  Aydia Maj

## 2015-06-01 NOTE — Progress Notes (Signed)
MOB was referred for history of depression/anxiety.  Referral is screened out by Clinical Social Worker because none of the following criteria appear to apply: -History of anxiety/depression during this pregnancy, or of post-partum depression. - Diagnosis of anxiety and/or depression within last 3 years or -MOB's symptoms are currently being treated with medication and/or therapy (Currently prescribed Wellbutrin)  Please contact the Clinical Social Worker if needs arise or upon MOB request.  

## 2015-06-01 NOTE — Lactation Note (Signed)
This note was copied from a baby's chart. Lactation Consultation Note  Experienced BF mother reports that BF is going well.  Hand expression taught with colostrum expressed.  Cue based feeding taught. Aware of support groups and outpatient services.  Patient Name: Erin Ruiz [name] [id number]'s Date: 06/01/2015 Reason for consult: Initial assessment   Maternal Data Does the patient have breastfeeding experience prior to this delivery?: Yes  Feeding Feeding Type: Breast Fed Length of feed: 35 min  LATCH Score/Interventions                      Lactation Tools Discussed/Used     Consult Status Consult Status: Follow-up Date: 06/02/15 Follow-up type: In-patient    Soyla DryerJoseph, Zachary Lovins 06/01/2015, 3:13 PM

## 2015-06-01 NOTE — Anesthesia Procedure Notes (Signed)
Epidural Patient location during procedure: OB  Staffing Anesthesiologist: Savon Cobbs EDWARD Performed by: anesthesiologist   Preanesthetic Checklist Completed: patient identified, pre-op evaluation, timeout performed, IV checked, risks and benefits discussed and monitors and equipment checked  Epidural Patient position: sitting Prep: DuraPrep Patient monitoring: blood pressure and continuous pulse ox Approach: midline Location: L3-L4 Injection technique: LOR air  Needle:  Needle type: Tuohy  Needle gauge: 17 G Needle length: 9 cm Needle insertion depth: 6 cm Catheter size: 19 Gauge Catheter at skin depth: 11 cm Test dose: negative and Other (1% Lidocaine)  Additional Notes Patient identified.  Risk benefits discussed including failed block, incomplete pain control, headache, nerve damage, paralysis, blood pressure changes, nausea, vomiting, reactions to medication both toxic or allergic, and postpartum back pain.  Patient expressed understanding and wished to proceed.  All questions were answered.  Sterile technique used throughout procedure and epidural site dressed with sterile barrier dressing. No paresthesia or other complications noted. The patient did not experience any signs of intravascular injection such as tinnitus or metallic taste in mouth nor signs of intrathecal spread such as rapid motor block. Please see nursing notes for vital signs. Reason for block:procedure for pain   

## 2015-06-01 NOTE — Anesthesia Preprocedure Evaluation (Signed)
Anesthesia Evaluation  Patient identified by MRN, date of birth, ID band Patient awake    Reviewed: Allergy & Precautions, NPO status , Patient's Chart, lab work & pertinent test results  Airway Mallampati: II  TM Distance: >3 FB Neck ROM: Full    Dental  (+) Teeth Intact, Dental Advisory Given   Pulmonary former smoker,    Pulmonary exam normal breath sounds clear to auscultation       Cardiovascular hypertension (pre-eclampsia), Normal cardiovascular exam Rhythm:Regular Rate:Normal     Neuro/Psych PSYCHIATRIC DISORDERS Depression negative neurological ROS     GI/Hepatic negative GI ROS, Neg liver ROS,   Endo/Other  Obesity   Renal/GU negative Renal ROS     Musculoskeletal negative musculoskeletal ROS (+)   Abdominal   Peds  Hematology  (+) Blood dyscrasia, anemia , Plt 179k    Anesthesia Other Findings Day of surgery medications reviewed with the patient.  Reproductive/Obstetrics (+) Pregnancy                             Anesthesia Physical Anesthesia Plan  ASA: III  Anesthesia Plan: Epidural   Post-op Pain Management:    Induction:   Airway Management Planned:   Additional Equipment:   Intra-op Plan:   Post-operative Plan:   Informed Consent: I have reviewed the patients History and Physical, chart, labs and discussed the procedure including the risks, benefits and alternatives for the proposed anesthesia with the patient or authorized representative who has indicated his/her understanding and acceptance.   Dental advisory given  Plan Discussed with:   Anesthesia Plan Comments: (Patient identified. Risks/Benefits/Options discussed with patient including but not limited to bleeding, infection, nerve damage, paralysis, failed block, incomplete pain control, headache, blood pressure changes, nausea, vomiting, reactions to medication both or allergic, itching and postpartum  back pain. Confirmed with bedside nurse the patient's most recent platelet count. Confirmed with patient that they are not currently taking any anticoagulation, have any bleeding history or any family history of bleeding disorders. Patient expressed understanding and wished to proceed. All questions were answered. )        Anesthesia Quick Evaluation

## 2015-06-02 ENCOUNTER — Encounter: Payer: Self-pay | Admitting: Obstetrics

## 2015-06-02 LAB — CBC
HEMATOCRIT: 30.9 % — AB (ref 36.0–46.0)
HEMOGLOBIN: 10.1 g/dL — AB (ref 12.0–15.0)
MCH: 28.5 pg (ref 26.0–34.0)
MCHC: 32.7 g/dL (ref 30.0–36.0)
MCV: 87.3 fL (ref 78.0–100.0)
Platelets: 160 10*3/uL (ref 150–400)
RBC: 3.54 MIL/uL — ABNORMAL LOW (ref 3.87–5.11)
RDW: 14.4 % (ref 11.5–15.5)
WBC: 10.1 10*3/uL (ref 4.0–10.5)

## 2015-06-02 MED ORDER — BUPROPION HCL ER (SR) 150 MG PO TB12: 150.0000 mg | ORAL_TABLET | Freq: Two times a day (BID) | ORAL | Status: DC

## 2015-06-02 MED ORDER — LANOLIN HYDROUS EX OINT: 1.0000 "application " | TOPICAL_OINTMENT | CUTANEOUS | Status: DC | PRN

## 2015-06-02 MED ORDER — OXYCODONE-ACETAMINOPHEN 5-325 MG PO TABS: 1.0000 | ORAL_TABLET | ORAL | Status: DC | PRN

## 2015-06-02 MED ORDER — BENZOCAINE-MENTHOL 20-0.5 % EX AERO: 1.0000 "application " | INHALATION_SPRAY | Freq: Four times a day (QID) | CUTANEOUS | Status: DC | PRN

## 2015-06-02 MED ORDER — IBUPROFEN 600 MG PO TABS: 600.0000 mg | ORAL_TABLET | Freq: Four times a day (QID) | ORAL | Status: DC

## 2015-06-02 MED ORDER — SENNOSIDES-DOCUSATE SODIUM 8.6-50 MG PO TABS: 2.0000 | ORAL_TABLET | Freq: Two times a day (BID) | ORAL | Status: DC

## 2015-06-02 MED ORDER — LABETALOL HCL 300 MG PO TABS: 300.0000 mg | ORAL_TABLET | Freq: Two times a day (BID) | ORAL | Status: DC

## 2015-06-02 MED ORDER — BISACODYL 10 MG RE SUPP: 10.0000 mg | Freq: Every day | RECTAL | Status: DC | PRN

## 2015-06-02 NOTE — Progress Notes (Signed)
Subjective:    Erin Ruiz is a 25 y.o. female being seen today for her obstetrical visit. She is at 7219w1d gestation. Patient reports no complaints. Fetal movement: normal.  Problem List Items Addressed This Visit    None    Visit Diagnoses    Prenatal care, third trimester    -  Primary    Relevant Orders    POCT urinalysis dipstick (Completed)      Patient Active Problem List   Diagnosis Date Noted  . Preeclampsia 06/01/2015  . Gestational hypertension without significant proteinuria in third trimester 05/31/2015  . Encounter for supervision of other normal pregnancy in first trimester 12/02/2014    Objective:    BP 125/85 mmHg  Pulse 120  Temp(Src) 98.2 F (36.8 C)  Wt 220 lb 6.4 oz (99.973 kg)  LMP 09/14/2014 (Approximate) FHT: 150 BPM  Uterine Size: size equals dates  Presentations: unsure    Assessment:    Pregnancy @ 2119w1d weeks   Plan:   Plans for delivery: Vaginal anticipated; labs reviewed; problem list updated Counseling: Consent signed. Infant feeding: plans to breastfeed. Cigarette smoking: former smoker. L&D discussion: symptoms of labor, discussed when to call, discussed what number to call, anesthetic/analgesic options reviewed and delivering clinician:  plans no preference. Postpartum supports and preparation: circumcision discussed and contraception plans discussed.  Follow up in 1 Week.

## 2015-06-02 NOTE — Progress Notes (Signed)
Notified Erin Ruiz CMW of infant discharge, mother requests at discharge. Will round about lunch time to see patient.

## 2015-06-02 NOTE — Discharge Summary (Signed)
Obstetric Discharge Summary Reason for Admission: induction of labor Prenatal Procedures: ultrasound Intrapartum Procedures: spontaneous vaginal delivery Postpartum Procedures: none Complications-Operative and Postpartum: none HEMOGLOBIN  Date Value Ref Range Status  06/02/2015 10.1* 12.0 - 15.0 g/dL Final   HCT  Date Value Ref Range Status  06/02/2015 30.9* 36.0 - 46.0 % Final   HEMATOCRIT  Date Value Ref Range Status  05/25/2015 37.6 34.0 - 46.6 % Final    Physical Exam:  General: alert, cooperative and no distress Lochia: appropriate Uterine Fundus: firm Incision: none DVT Evaluation: No evidence of DVT seen on physical exam. No cords or calf tenderness.  Discharge Diagnoses: Term Pregnancy-delivered and Preelampsia  Discharge Information: Date: 06/02/2015 Activity: pelvic rest Diet: routine Medications: PNV, Ibuprofen, Colace, Iron and Percocet Condition: stable Instructions: refer to practice specific booklet Discharge to: home   Newborn Data: Live born female  Birth Weight: 7 lb 12.5 oz (3530 g) APGAR: 9, 9  Home with mother.  Roe Coombsachelle A Erin Ruiz, CNM 06/02/2015, 12:39 PM

## 2015-06-02 NOTE — Progress Notes (Signed)
Post Partum Day #1 Subjective: no complaints, up ad lib, voiding and tolerating PO  Objective: Blood pressure 104/51, pulse 98, temperature 98.2 F (36.8 C), temperature source Oral, resp. rate 20, height 5\' 3"  (1.6 m), weight 220 lb (99.791 kg), last menstrual period 09/14/2014, SpO2 100 %, unknown if currently breastfeeding.  Physical Exam:  General: alert, cooperative and no distress Lochia: appropriate Uterine Fundus: firm Incision: none DVT Evaluation: No evidence of DVT seen on physical exam. No cords or calf tenderness.   Recent Labs  05/31/15 1249 06/01/15 0453  HGB 11.6* 11.7*  HCT 34.6* 35.1*    Assessment/Plan: Plan for discharge tomorrow, Breastfeeding and Lactation consult   LOS: 2 days   Roe CoombsRachelle A Evelynne Spiers, CNM 06/02/2015, 3:39 AM

## 2015-06-02 NOTE — Lactation Note (Signed)
This note was copied from a baby's chart. Lactation Consultation Note  Baby latched upon entering and mother states he comes off and on. Suggest compressing breast for more depth and massaging until she views swallows. Mom encouraged to feed baby 8-12 times/24 hours and with feeding cues.  Reviewed engorgement care and monitoring voids/stools. Provided mother w/ hand pump on request.  Patient Name: Boy Joseph [name] [id number]'ZToday's Date: 06/02/2015 Reason for consult: Follow-up assessment   Maternal Data    Feeding Feeding Type: Breast Fed Length of feed: 10 min  LATCH Score/Interventions Latch: Grasps breast easily, tongue down, lips flanged, rhythmical sucking. Intervention(s): Breast massage  Audible Swallowing: A few with stimulation  Type of Nipple: Everted at rest and after stimulation  Comfort (Breast/Nipple): Soft / non-tender     Hold (Positioning): Assistance needed to correctly position infant at breast and maintain latch.  LATCH Score: 8  Lactation Tools Discussed/Used     Consult Status Consult Status: Complete    Hardie PulleyBerkelhammer, Ruth Boschen 06/02/2015, 12:05 PM

## 2015-06-07 ENCOUNTER — Ambulatory Visit: Payer: Medicaid Other | Admitting: *Deleted

## 2015-06-07 ENCOUNTER — Encounter: Payer: Medicaid Other | Admitting: Certified Nurse Midwife

## 2015-06-07 VITALS — BP 128/79 | HR 73

## 2015-06-07 DIAGNOSIS — O163 Unspecified maternal hypertension, third trimester: Secondary | ICD-10-CM

## 2015-06-07 NOTE — Progress Notes (Signed)
Patient reports she is doing well with ehr blood pressure. She does get headaches- but she is better.

## 2015-06-13 ENCOUNTER — Telehealth: Payer: Self-pay | Admitting: Obstetrics

## 2015-06-13 NOTE — Telephone Encounter (Signed)
Pt was hoping to get a refill on the percocet pain medicine. Please advise

## 2015-06-13 NOTE — Telephone Encounter (Signed)
Patient notified

## 2015-06-13 NOTE — Telephone Encounter (Signed)
Request for Percocet denied.  Ibuprofen should be adequate at this point, and it's been less than 2 weeks after NSVD, She should still have some Percocet from discharge from hospital on ~ 06-04-15.

## 2015-06-22 ENCOUNTER — Ambulatory Visit (INDEPENDENT_AMBULATORY_CARE_PROVIDER_SITE_OTHER): Payer: Medicaid Other | Admitting: Certified Nurse Midwife

## 2015-06-22 ENCOUNTER — Encounter: Payer: Self-pay | Admitting: Certified Nurse Midwife

## 2015-06-22 ENCOUNTER — Telehealth: Payer: Self-pay | Admitting: *Deleted

## 2015-06-22 DIAGNOSIS — R399 Unspecified symptoms and signs involving the genitourinary system: Secondary | ICD-10-CM

## 2015-06-22 LAB — POCT URINALYSIS DIPSTICK
BILIRUBIN UA: NEGATIVE
Glucose, UA: NEGATIVE
KETONES UA: NEGATIVE
NITRITE UA: NEGATIVE
PH UA: 7
PROTEIN UA: NEGATIVE
RBC UA: 250
Spec Grav, UA: <=1.005
Urobilinogen, UA: NEGATIVE

## 2015-06-22 NOTE — Telephone Encounter (Signed)
Patient saw [name] today and she was supposed to send Rx to the pharmacy. They are not there.

## 2015-06-22 NOTE — Progress Notes (Signed)
Patient ID: Erin Ruiz, female   DOB: 1990/09/07, 25 y.o.   MRN: [id number]  Subjective:     Erin Ruiz is a 25 y.o. female who presents for a postpartum visit. She is 2 weeks postpartum following a spontaneous vaginal delivery. I have fully reviewed the prenatal and intrapartum course. The delivery was at 39 gestational weeks. Outcome: spontaneous vaginal delivery. Anesthesia: epidural. Postpartum course has been normal. Baby's course has been normal. Baby is feeding by breast. Bleeding no bleeding. Bowel function is normal. Bladder function is abnormal: states that she is having low back pain, denies any burning with urination. Patient is not sexually active. Contraception method is abstinence. Postpartum depression screening: negative.  Tobacco, alcohol and substance abuse history reviewed.  Adult immunizations reviewed including TDAP, rubella and varicella.  The following portions of the patient's history were reviewed and updated as appropriate: allergies, current medications, past family history, past medical history, past social history, past surgical history and problem list.  Review of Systems Pertinent items noted in HPI and remainder of comprehensive ROS otherwise negative.   Objective:    BP 113/64 mmHg  Pulse 86  Wt 198 lb (89.812 kg)  General:  alert, cooperative and no distress   Breasts:  inspection negative, no nipple discharge or bleeding, no masses or nodularity palpable  Lungs: clear to auscultation bilaterally  Heart:  regular rate and rhythm, S1, S2 normal, no murmur, click, rub or gallop  Kidneys  Right side slight CVA tenderness noted  Abdomen: soft, non-tender; bowel sounds normal; no masses,  no organomegaly          50% of 15 min visit spent on counseling and coordination of care.  Assessment:     Normal 2 week postpartum exam. Pap smear not done at today's visit.     Hematuria, probable UTI    Plan:    1. Contraception: abstinence 2.  Planning  Micronor POP 3.  Urine culture for suspected UTI 4. Follow up in: 4 weeks or as needed.  2hr GTT for h/o GDM/screening for DM q 3 yrs per ADA recommendations Preconception counseling provided Healthy lifestyle practices reviewed

## 2015-06-23 ENCOUNTER — Other Ambulatory Visit: Payer: Self-pay | Admitting: Certified Nurse Midwife

## 2015-06-23 DIAGNOSIS — R319 Hematuria, unspecified: Secondary | ICD-10-CM

## 2015-06-23 LAB — URINE CULTURE

## 2015-06-23 MED ORDER — PHENAZOPYRIDINE HCL 100 MG PO TABS: 100.0000 mg | ORAL_TABLET | Freq: Three times a day (TID) | ORAL | Status: DC | PRN

## 2015-06-23 MED ORDER — NITROFURANTOIN MONOHYD MACRO 100 MG PO CAPS
100.0000 mg | ORAL_CAPSULE | Freq: Two times a day (BID) | ORAL | Status: AC
Start: 2015-06-23 — End: 2015-06-30

## 2015-06-23 NOTE — Telephone Encounter (Signed)
Please tell her that the Macrobid and pyridium should be at the pharmacy soon.  Thank you.  R.Quayshawn Nin CNM

## 2015-06-24 ENCOUNTER — Telehealth: Payer: Self-pay | Admitting: *Deleted

## 2015-06-24 NOTE — Telephone Encounter (Signed)
Pt called to office to inquire about Rx for UTI being sent to pharmacy after last visit. Return call to pt. Pt made aware Rx were sent in on 06-23-15.  Pt advised to call if any problems getting Rx.

## 2015-06-27 NOTE — Telephone Encounter (Signed)
Patient notified

## 2015-07-20 ENCOUNTER — Ambulatory Visit: Payer: Medicaid Other | Admitting: Certified Nurse Midwife

## 2015-07-21 ENCOUNTER — Telehealth: Payer: Self-pay | Admitting: *Deleted

## 2015-07-21 ENCOUNTER — Other Ambulatory Visit: Payer: Self-pay | Admitting: *Deleted

## 2015-07-21 DIAGNOSIS — K649 Unspecified hemorrhoids: Secondary | ICD-10-CM

## 2015-07-21 DIAGNOSIS — K644 Residual hemorrhoidal skin tags: Secondary | ICD-10-CM

## 2015-07-21 MED ORDER — HYDROCORTISONE ACETATE 25 MG RE SUPP: 25.0000 mg | Freq: Two times a day (BID) | RECTAL | Status: DC

## 2015-07-21 NOTE — Telephone Encounter (Signed)
Patient states she is having a hard time with her hemorrhoid - OTC is not helping. She is almost afraid to use the bathroom- she is so uncomfortable.  3:45 Call to patient- Rx sent to pharmacy- coupon explained. Referral to general surgery made.

## 2015-07-27 ENCOUNTER — Ambulatory Visit (INDEPENDENT_AMBULATORY_CARE_PROVIDER_SITE_OTHER): Payer: Medicaid Other | Admitting: Certified Nurse Midwife

## 2015-07-27 ENCOUNTER — Encounter: Payer: Self-pay | Admitting: Certified Nurse Midwife

## 2015-07-27 VITALS — BP 114/75 | HR 87 | Wt 200.0 lb

## 2015-07-27 DIAGNOSIS — F419 Anxiety disorder, unspecified: Secondary | ICD-10-CM

## 2015-07-27 DIAGNOSIS — F329 Major depressive disorder, single episode, unspecified: Secondary | ICD-10-CM | POA: Diagnosis not present

## 2015-07-27 DIAGNOSIS — Z30011 Encounter for initial prescription of contraceptive pills: Secondary | ICD-10-CM | POA: Diagnosis not present

## 2015-07-27 DIAGNOSIS — F418 Other specified anxiety disorders: Secondary | ICD-10-CM | POA: Diagnosis not present

## 2015-07-27 DIAGNOSIS — F32A Depression, unspecified: Secondary | ICD-10-CM

## 2015-07-27 MED ORDER — NORETHINDRONE 0.35 MG PO TABS: 1.0000 | ORAL_TABLET | Freq: Every day | ORAL | Status: DC

## 2015-07-27 MED ORDER — ESCITALOPRAM OXALATE 20 MG PO TABS: 20.0000 mg | ORAL_TABLET | Freq: Every day | ORAL | Status: DC

## 2015-07-27 NOTE — Progress Notes (Signed)
Patient ID: Erin Ruiz, female   DOB: 1990/09/19, 25 y.o.   MRN: [id number]  Subjective:     Erin Ruiz is a 25 y.o. female who presents for a postpartum visit. She is 6 weeks postpartum following a spontaneous vaginal delivery. I have fully reviewed the prenatal and intrapartum course. The delivery was at 39 gestational weeks. Outcome: spontaneous vaginal delivery. Anesthesia: epidural. Postpartum course has been normal, complicated by depression. Baby's course has been normal. Baby is feeding by breast. Bleeding no bleeding. Bowel function is normal. Bladder function is normal. Patient is sexually active. Contraception method is abstinence and condoms. Postpartum depression screening: positive, medication changed.  Tobacco, alcohol and substance abuse history reviewed.  Adult immunizations reviewed including TDAP, rubella and varicella.  The following portions of the patient's history were reviewed and updated as appropriate: allergies, current medications, past family history, past medical history, past social history, past surgical history and problem list.  Review of Systems Pertinent items noted in HPI and remainder of comprehensive ROS otherwise negative.   Objective:    BP 114/75 mmHg  Pulse 87  Wt 200 lb (90.719 kg)  General:  alert, cooperative and no distress   Breasts:  inspection negative, no nipple discharge or bleeding, no masses or nodularity palpable  Lungs: clear to auscultation bilaterally  Heart:  regular rate and rhythm, S1, S2 normal, no murmur, click, rub or gallop  Abdomen: soft, non-tender; bowel sounds normal; no masses,  no organomegaly          50% of 15 min visit spent on counseling and coordination of care.  Assessment:     Normal 5 week postpartum exam. Pap smear not done at today's visit.     Depression/Anxiety Plan:    1. Contraception: oral progesterone-only contraceptive 2.  Depression medications changed from Wellbutrin to Lexapro 3.  Journey's counseling referral 4. Follow up in: 4 months or as needed.  2hr GTT for h/o GDM/screening for DM q 3 yrs per ADA recommendations Preconception counseling provided Healthy lifestyle practices reviewed

## 2015-11-16 ENCOUNTER — Ambulatory Visit: Payer: Medicaid Other | Admitting: Internal Medicine

## 2015-11-29 ENCOUNTER — Encounter: Payer: Self-pay | Admitting: Internal Medicine

## 2015-11-29 ENCOUNTER — Ambulatory Visit: Payer: Medicaid Other | Attending: Internal Medicine | Admitting: Internal Medicine

## 2015-11-29 VITALS — BP 112/74 | HR 88 | Temp 97.9°F | Resp 16 | Wt 210.0 lb

## 2015-11-29 DIAGNOSIS — F329 Major depressive disorder, single episode, unspecified: Secondary | ICD-10-CM | POA: Diagnosis not present

## 2015-11-29 DIAGNOSIS — G2581 Restless legs syndrome: Secondary | ICD-10-CM | POA: Diagnosis not present

## 2015-11-29 DIAGNOSIS — O133 Gestational [pregnancy-induced] hypertension without significant proteinuria, third trimester: Secondary | ICD-10-CM | POA: Diagnosis not present

## 2015-11-29 DIAGNOSIS — E669 Obesity, unspecified: Secondary | ICD-10-CM | POA: Diagnosis not present

## 2015-11-29 DIAGNOSIS — Z79899 Other long term (current) drug therapy: Secondary | ICD-10-CM | POA: Diagnosis not present

## 2015-11-29 DIAGNOSIS — D649 Anemia, unspecified: Secondary | ICD-10-CM | POA: Diagnosis not present

## 2015-11-29 DIAGNOSIS — F53 Postpartum depression: Secondary | ICD-10-CM

## 2015-11-29 DIAGNOSIS — Z87891 Personal history of nicotine dependence: Secondary | ICD-10-CM | POA: Insufficient documentation

## 2015-11-29 DIAGNOSIS — O99345 Other mental disorders complicating the puerperium: Secondary | ICD-10-CM

## 2015-11-29 LAB — CBC WITH DIFFERENTIAL/PLATELET
BASOS ABS: 0 {cells}/uL (ref 0–200)
Basophils Relative: 0 %
EOS ABS: 134 {cells}/uL (ref 15–500)
EOS PCT: 2 %
HEMATOCRIT: 39.2 % (ref 35.0–45.0)
HEMOGLOBIN: 13 g/dL (ref 11.7–15.5)
LYMPHS ABS: 2077 {cells}/uL (ref 850–3900)
Lymphocytes Relative: 31 %
MCH: 28.8 pg (ref 27.0–33.0)
MCHC: 33.2 g/dL (ref 32.0–36.0)
MCV: 86.9 fL (ref 80.0–100.0)
MONO ABS: 469 {cells}/uL (ref 200–950)
MPV: 9.3 fL (ref 7.5–12.5)
Monocytes Relative: 7 %
NEUTROS PCT: 60 %
Neutro Abs: 4020 cells/uL (ref 1500–7800)
Platelets: 231 10*3/uL (ref 140–400)
RBC: 4.51 MIL/uL (ref 3.80–5.10)
RDW: 12.7 % (ref 11.0–15.0)
WBC: 6.7 10*3/uL (ref 3.8–10.8)

## 2015-11-29 MED ORDER — DICLOFENAC SODIUM 1 % TD GEL: 2.0000 g | Freq: Four times a day (QID) | TRANSDERMAL | 2 refills | Status: DC

## 2015-11-29 NOTE — Progress Notes (Signed)
Erin Ruiz, is a 25 y.o. female  ZOX:[id number]  [id number]  DOB - 02-Sep-1990  CC:  Chief Complaint  Patient presents with  . Establish Care       HPI: Erin Ruiz is a 25 y.o. female here today to establish medical care.  Pt is g3p3, sp spontaneous vag delivery 05/31/15, w/ pre-elapsia noted, started on labetolol 300bid after delivery. She has been off her meds for few months.  C/o of intermittent restless legs at night, hard time getting her legs comfortable, uses multiple pillows. Feels like sees spots sometimes, denies presyncope/syncope though.  Has depression, and well controlled w/ lexapro 20 qday only currently. Not taking welbutrin currenty. Denies si/hi/avh.  She stands on her feet all day , currently in cosmetology school.    +significant weight gain, hard time losing weight this 3rd pregnancy.  She notes her diet is not the best.  Not as active as she would like.  + currently breast feeding, good supply.  + per pt, did not received TDAP w/ most recent pregnancy, but had it about 5 years ago w/ her 1st dgt.  Declined flu vaccine.  Patient has No headache, No chest pain, No abdominal pain - No Nausea, No new weakness tingling or numbness, No Cough - SOB.    Review of Systems: Per hpi, o/w all sysste  No Known Allergies Past Medical History:  Diagnosis Date  . Depression   . Heart murmur    childhood   Current Outpatient Prescriptions on File Prior to Visit  Medication Sig Dispense Refill  . escitalopram (LEXAPRO) 20 MG tablet Take 1 tablet (20 mg total) by mouth daily. 30 tablet 9  . ibuprofen (ADVIL,MOTRIN) 600 MG tablet Take 1 tablet (600 mg total) by mouth every 6 (six) hours. 90 tablet 2  . norethindrone (MICRONOR,CAMILA,ERRIN) 0.35 MG tablet Take 1 tablet (0.35 mg total) by mouth daily. 1 Package 11  . Prenat-Fe Poly-Methfol-FA-DHA (VITAFOL ULTRA) 29-0.6-0.4-200 MG CAPS Take 1 capsule by mouth at bedtime.    . senna-docusate (SENOKOT-S)  8.6-50 MG tablet Take 2 tablets by mouth 2 (two) times daily. (Patient not taking: Reported on 11/29/2015) 90 tablet 0   No current facility-administered medications on file prior to visit.    Family History  Problem Relation Age of Onset  . Hypertension Mother   . Asthma Father    Social History   Social History  . Marital status: Married    Spouse name: N/A  . Number of children: N/A  . Years of education: N/A   Occupational History  . Not on file.   Social History Main Topics  . Smoking status: Former Games developer  . Smokeless tobacco: Not on file  . Alcohol use No  . Drug use: No  . Sexual activity: Yes    Partners: Male    Birth control/ protection: None   Other Topics Concern  . Not on file   Social History Narrative  . No narrative on file    Objective:   Vitals:   11/29/15 1131  BP: 112/74  Pulse: 88  Resp: 16  Temp: 97.9 F (36.6 C)    Filed Weights   11/29/15 1131  Weight: 210 lb (95.3 kg)    BP Readings from Last 3 Encounters:  11/29/15 112/74  07/27/15 114/75  06/22/15 113/64    Physical Exam: Constitutional: Patient appears well-developed and well-nourished. No distress. AAOx3, obese, pleasant, good eye contact. HENT: Normocephalic, atraumatic, External right and left ear normal. Oropharynx is  clear and moist.  Eyes: Conjunctivae and EOM are normal. PERRL, no scleral icterus. Neck: Normal ROM. Neck supple. No JVD.  CVS: RRR, S1/S2 +, no murmurs, no gallops, no carotid bruit.  Pulmonary: Effort and breath sounds normal, no stridor, rhonchi, wheezes, rales.  Abdominal: Soft. BS +, obese, no distension, tenderness, rebound or guarding.  Musculoskeletal: Normal range of motion. No edema and no tenderness.  LE: bilat/ no c/c/e, pulses 2+ bilateral. Neuro: Alert. muscle tone coordination wnl. No cranial nerve deficit grossly. Skin: Skin is warm and dry. No rash noted. Not diaphoretic. No erythema. No pallor. Psychiatric: Normal mood and affect.  Behavior, judgment, thought content normal.  Lab Results  Component Value Date   WBC 10.1 06/02/2015   HGB 10.1 (L) 06/02/2015   HCT 30.9 (L) 06/02/2015   MCV 87.3 06/02/2015   PLT 160 06/02/2015   Lab Results  Component Value Date   CREATININE 0.45 05/31/2015   BUN 6 05/31/2015   NA 137 05/31/2015   K 3.9 05/31/2015   CL 107 05/31/2015   CO2 21 (L) 05/31/2015    Lab Results  Component Value Date   HGBA1C 5.30 03/03/2014   Lipid Panel  No results found for: CHOL, TRIG, HDL, CHOLHDL, VLDL, LDLCALC     Depression screen [facility] 2/9 11/29/2015 08/23/2014 03/03/2014  Decreased Interest (No Data) 0 0  Down, Depressed, Hopeless 0 0 0  PHQ - 2 Score 0 0 0    Assessment and plan:   1. Anemia, unspecified anemia type - if remains iron def, may be causing the restless leg. - currently taking iron supplements/prenatal - CBC with Differential  2. Restless leg syndrome ? Etiology unclear, will chk cbc. - trial voltarin gel, prn, will not affect pregn - VITAMIN D 25 Hydroxy (Vit-D Deficiency, Fractures)  3. Gestational HTN, third trimester Resolved, currently off labetalol, will discontinue from med list.  4. Spots? Not certain etiology, no presyncope/syncope,   5. Depression, post partum Well controlled w/ lexapro, provided by ob  6. Birth control - on ocps, per ob.  7. Weight gain w/ pregnancy, obesity - recd increase exercise, dw lower carb diet, recd <70gm or so, may need to adjust based on her mild supply, for weight loss. - lchf/if diet info provided.  8. Pt declined flu vac.  Return in about 6 months (around 05/28/2016), or if symptoms worsen or fail to improve.  The patient was given clear instructions to go to ER or return to medical center if symptoms don't improve, worsen or new problems develop. The patient verbalized understanding. The patient was told to call to get lab results if they haven't heard anything in the next week.    This note has been created  with Education officer, environmentalDragon speech recognition software and smart phrase technology. Any transcriptional errors are unintentional.   Pete Glatterawn T Langeland, MD, MBA/MHA [facility] [place], [place] [phone]   11/29/2015, 11:52 AM

## 2015-11-29 NOTE — Patient Instructions (Addendum)
Recd carbs < 70gm/daily  QUICK START PATIENT GUIDE TO LCHF/IF LOW CARB HIGH FAT / INTERMITTENT FASTING What is this diet and how does it work? o Insulin is a hormone made by your body that allows you to use sugar (glucose) from carbohydrates in the food you eat for energy or to store glucose (as fat) for future use  o Insulin levels need to be lowered in order to utilize our stored energy (fat) o Many struggling with obesity are insulin resistant and have high levels of insulin o This diet works to lower your insulin in two ways o Fasting - allows your insulin levels to naturally decrease  o Avoiding carbohydrates - carbs trigger increase in insulin Low Carb Healthy Fat (LCHF) o Get a free app for your phone, such as MyFitnessPal, to help you track your macronutrients (carbs/protein/fats) and to track your weight and body measurements to see your progress o Set your goal for around 10% carbs/20% protein/70% fat o A good starting goal for amount of net carbs per day is 50 grams (some will aim for 20 grams) o "Net carbs" refers to total grams of carbs minus grams of fiber (as fiber is not typically absorbed). For example, if a food has 5g total carb and 3g fiber, that would be 2g net carbs o Increase healthy fats - eg. olive oil, eggs, nuts, avocado, cheese, butter, coconut, meats, fish o Avoid high carb foods - eg. bread, pasta, potatoes, rice, cookies, soda, juice, anything sugary o Buy full-fat ingredients (avoid low-fat versions, which often have more sugar) o No need to count calories, but pay close attention to grams of carbs on labels Intermittent Fasting (IF) o "Fasting" is going a period of time without eating - it helps to stay busy and well-hydrated o Purpose of fasting is to allow insulin levels to drop as low as possible, allowing your body to switch into fat-burning mode o With this diet there are many approaches to fasting, but 16:8 and 24hr fasts are commonly used o 16:8 fast,  usually 5-7 days a week - Fasting for 16 hours of the day, then eating all meals for the day over course of 8 hours. o 24 hour fast, usually 1-3 days a week - Typically eating one meal a day, then fasting until the next day. Plenty of fluids (and some salt to help you hold onto fluids) are recommended during longer fasts.  o During fasts certain beverages are still acceptable - water, sparkling water, bone broth, black tea or coffee, or tea/coffee with small amount of heavy whipping cream Special note for those on diabetic medications o Discuss your medications with your physician. You may need to hold your medication or adjust to only taking when eating. Diabetics should keep close track of their blood sugars when making any changes to diet/meds, to ensure they are staying within normal limits For more info about LCHF/IF o Watch "Therapeutic Fasting - Solving the Two-Compartment Problem" video by Dr. Sharman Cheek on YouTube (GreatestGyms.com.ee) for a great intro to these concepts o Read "The Obesity Code" and/or "The Complete Guide to Fasting" by Dr. Sharman Cheek o Go to www.dietdoctor.com for explanations, recipes, and infographics about foods to eat/avoid EXAMPLES TO GET STARTED Fasting Beverages -water (can add  tsp Pink Himalayan salt once or twice a day to help stay hydrated for longer fasts) -Sparkling water (such as [employer] or similar; avoid any with artificial sweeteners)  -Bone broth (multiple recipes available online or can buy  pre-made) -Tea or Coffee (Adding heavy whipping cream or coconut oil to your tea or coffee can be helpful if you find yourself getting too hungry during the fasts. Can also add cinnamon for flavor. Or "bulletproof coffee.") Low Carb Healthy Fat Breakfast (if not fasting) -eggs in butter or olive oil with avocado -omelet with veggies and cheese  Lunch -hamburger with cheese and avocado wrapped in lettuce (no bun, no ketchup) -meat and  cheese wrapped in lettuce (can dip in mustard or olive oil/vinegar/mayo) -salad with meat/cheese/nuts and higher fat dressing (vinaigrette or Ranch, etc) -tuna salad lettuce wrap -taco meat with cheese, sour cream, guacamole, cheese over lettuce  Dinner -steak with herb butter or Barnaise sauce -"Fathead" pizza (uses cheese and almond flour for the dough - several recipes available online) -roasted or grilled chicken with skin on, with low carb sauce (buffalo, garlic butter, alfredo, pesto, etc) -baked salmon with lemon butter -chicken alfredo with zucchini noodles -[place] butter chicken with low carb garlic naan -egg roll in a bowl  Side Dishes -mashed cauliflower (homemade or available in freezer section) -roast vegetables (green veggies that grow above ground rather than root veggies) with butter or cheese -Caprese salad (fresh mozzarella, tomato and basil with olive oil) -homemade low-carb coleslaw Snacks/Desserts (try to avoid unnecessary snacking and sweets in general) -celery or cucumber dipped in guacamole or sour cream dip -cheese and meat slices  -raspberries with whipped cream (can make homemade with no sugar added) -low carb [place] butter cake  AVOID - sugar, diet/regular soda, potatoes, breads, rice, pasta, candy, cookies, cakes, muffins, juice, high carb fruit (bananas, grapes), beer, ketchup, barbeque and other sweet sauces   - Health Maintenance, Female Adopting a healthy lifestyle and getting preventive care can go a long way to promote health and wellness. Talk with your health care provider about what schedule of regular examinations is right for you. This is a good chance for you to check in with your provider about disease prevention and staying healthy. In between checkups, there are plenty of things you can do on your own. Experts have done a lot of research about which lifestyle changes and preventive measures are most likely to keep you healthy. Ask your  health care provider for more information. WEIGHT AND DIET  Eat a healthy diet  Be sure to include plenty of vegetables, fruits, low-fat dairy products, and lean protein.  Do not eat a lot of foods high in solid fats, added sugars, or salt.  Get regular exercise. This is one of the most important things you can do for your health.  Most adults should exercise for at least 150 minutes each week. The exercise should increase your heart rate and make you sweat (moderate-intensity exercise).  Most adults should also do strengthening exercises at least twice a week. This is in addition to the moderate-intensity exercise.  Maintain a healthy weight  Body mass index (BMI) is a measurement that can be used to identify possible weight problems. It estimates body fat based on height and weight. Your health care provider can help determine your BMI and help you achieve or maintain a healthy weight.  For females 29 years of age and older:   A BMI below 18.5 is considered underweight.  A BMI of 18.5 to 24.9 is normal.  A BMI of 25 to 29.9 is considered overweight.  A BMI of 30 and above is considered obese.  Watch levels of cholesterol and blood lipids  You should start having your  blood tested for lipids and cholesterol at 24 years of age, then have this test every 5 years.  You may need to have your cholesterol levels checked more often if:  Your lipid or cholesterol levels are high.  You are older than 24 years of age.  You are at high risk for heart disease.  CANCER SCREENING   Lung Cancer  Lung cancer screening is recommended for adults 24-75 years old who are at high risk for lung cancer because of a history of smoking.  A yearly low-dose CT scan of the lungs is recommended for people who:  Currently smoke.  Have quit within the past 15 years.  Have at least a 30-pack-year history of smoking. A pack year is smoking an average of one pack of cigarettes a day for 1  year.  Yearly screening should continue until it has been 15 years since you quit.  Yearly screening should stop if you develop a health problem that would prevent you from having lung cancer treatment.  Breast Cancer  Practice breast self-awareness. This means understanding how your breasts normally appear and feel.  It also means doing regular breast self-exams. Let your health care provider know about any changes, no matter how small.  If you are in your 20s or 30s, you should have a clinical breast exam (CBE) by a health care provider every 1-3 years as part of a regular health exam.  If you are 7 or older, have a CBE every year. Also consider having a breast X-ray (mammogram) every year.  If you have a family history of breast cancer, talk to your health care provider about genetic screening.  If you are at high risk for breast cancer, talk to your health care provider about having an MRI and a mammogram every year.  Breast cancer gene (BRCA) assessment is recommended for women who have family members with BRCA-related cancers. BRCA-related cancers include:  Breast.  Ovarian.  Tubal.  Peritoneal cancers.  Results of the assessment will determine the need for genetic counseling and BRCA1 and BRCA2 testing. Cervical Cancer Your health care provider may recommend that you be screened regularly for cancer of the pelvic organs (ovaries, uterus, and vagina). This screening involves a pelvic examination, including checking for microscopic changes to the surface of your cervix (Pap test). You may be encouraged to have this screening done every 3 years, beginning at age 41.  For women ages 1-65, health care providers may recommend pelvic exams and Pap testing every 3 years, or they may recommend the Pap and pelvic exam, combined with testing for human papilloma virus (HPV), every 5 years. Some types of HPV increase your risk of cervical cancer. Testing for HPV may also be done on  women of any age with unclear Pap test results.  Other health care providers may not recommend any screening for nonpregnant women who are considered low risk for pelvic cancer and who do not have symptoms. Ask your health care provider if a screening pelvic exam is right for you.  If you have had past treatment for cervical cancer or a condition that could lead to cancer, you need Pap tests and screening for cancer for at least 20 years after your treatment. If Pap tests have been discontinued, your risk factors (such as having a new sexual partner) need to be reassessed to determine if screening should resume. Some women have medical problems that increase the chance of getting cervical cancer. In these cases, your health care  provider may recommend more frequent screening and Pap tests. Colorectal Cancer  This type of cancer can be detected and often prevented.  Routine colorectal cancer screening usually begins at 24 years of age and continues through 24 years of age.  Your health care provider may recommend screening at an earlier age if you have risk factors for colon cancer.  Your health care provider may also recommend using home test kits to check for hidden blood in the stool.  A small camera at the end of a tube can be used to examine your colon directly (sigmoidoscopy or colonoscopy). This is done to check for the earliest forms of colorectal cancer.  Routine screening usually begins at age 30.  Direct examination of the colon should be repeated every 5-10 years through 24 years of age. However, you may need to be screened more often if early forms of precancerous polyps or small growths are found. Skin Cancer  Check your skin from head to toe regularly.  Tell your health care provider about any new moles or changes in moles, especially if there is a change in a mole's shape or color.  Also tell your health care provider if you have a mole that is larger than the size of a  pencil eraser.  Always use sunscreen. Apply sunscreen liberally and repeatedly throughout the day.  Protect yourself by wearing long sleeves, pants, a wide-brimmed hat, and sunglasses whenever you are outside. HEART DISEASE, DIABETES, AND HIGH BLOOD PRESSURE   High blood pressure causes heart disease and increases the risk of stroke. High blood pressure is more likely to develop in:  People who have blood pressure in the high end of the normal range (130-139/85-89 mm Hg).  People who are overweight or obese.  People who are African American.  If you are 35-7 years of age, have your blood pressure checked every 3-5 years. If you are 48 years of age or older, have your blood pressure checked every year. You should have your blood pressure measured twice--once when you are at a hospital or clinic, and once when you are not at a hospital or clinic. Record the average of the two measurements. To check your blood pressure when you are not at a hospital or clinic, you can use:  An automated blood pressure machine at a pharmacy.  A home blood pressure monitor.  If you are between 87 years and 42 years old, ask your health care provider if you should take aspirin to prevent strokes.  Have regular diabetes screenings. This involves taking a blood sample to check your fasting blood sugar level.  If you are at a normal weight and have a low risk for diabetes, have this test once every three years after 24 years of age.  If you are overweight and have a high risk for diabetes, consider being tested at a younger age or more often. PREVENTING INFECTION  Hepatitis B  If you have a higher risk for hepatitis B, you should be screened for this virus. You are considered at high risk for hepatitis B if:  You were born in a country where hepatitis B is common. Ask your health care provider which countries are considered high risk.  Your parents were born in a high-risk country, and you have not been  immunized against hepatitis B (hepatitis B vaccine).  You have HIV or AIDS.  You use needles to inject street drugs.  You live with someone who has hepatitis B.  You have  had sex with someone who has hepatitis B.  You get hemodialysis treatment.  You take certain medicines for conditions, including cancer, organ transplantation, and autoimmune conditions. Hepatitis C  Blood testing is recommended for:  Everyone born from [birth year] through 1964.  Anyone with known risk factors for hepatitis C. Sexually transmitted infections (STIs)  You should be screened for sexually transmitted infections (STIs) including gonorrhea and chlamydia if:  You are sexually active and are younger than 24 years of age.  You are older than 24 years of age and your health care provider tells you that you are at risk for this type of infection.  Your sexual activity has changed since you were last screened and you are at an increased risk for chlamydia or gonorrhea. Ask your health care provider if you are at risk.  If you do not have HIV, but are at risk, it may be recommended that you take a prescription medicine daily to prevent HIV infection. This is called pre-exposure prophylaxis (PrEP). You are considered at risk if:  You are sexually active and do not regularly use condoms or know the HIV status of your partner(s).  You take drugs by injection.  You are sexually active with a partner who has HIV. Talk with your health care provider about whether you are at high risk of being infected with HIV. If you choose to begin PrEP, you should first be tested for HIV. You should then be tested every 3 months for as long as you are taking PrEP.  PREGNANCY   If you are premenopausal and you may become pregnant, ask your health care provider about preconception counseling.  If you may become pregnant, take 400 to 800 micrograms (mcg) of folic acid every day.  If you want to prevent pregnancy, talk to your  health care provider about birth control (contraception). OSTEOPOROSIS AND MENOPAUSE   Osteoporosis is a disease in which the bones lose minerals and strength with aging. This can result in serious bone fractures. Your risk for osteoporosis can be identified using a bone density scan.  If you are 38 years of age or older, or if you are at risk for osteoporosis and fractures, ask your health care provider if you should be screened.  Ask your health care provider whether you should take a calcium or vitamin D supplement to lower your risk for osteoporosis.  Menopause may have certain physical symptoms and risks.  Hormone replacement therapy may reduce some of these symptoms and risks. Talk to your health care provider about whether hormone replacement therapy is right for you.  HOME CARE INSTRUCTIONS   Schedule regular health, dental, and eye exams.  Stay current with your immunizations.   Do not use any tobacco products including cigarettes, chewing tobacco, or electronic cigarettes.  If you are pregnant, do not drink alcohol.  If you are breastfeeding, limit how much and how often you drink alcohol.  Limit alcohol intake to no more than 1 drink per day for nonpregnant women. One drink equals 12 ounces of beer, 5 ounces of wine, or 1 ounces of hard liquor.  Do not use street drugs.  Do not share needles.  Ask your health care provider for help if you need support or information about quitting drugs.  Tell your health care provider if you often feel depressed.  Tell your health care provider if you have ever been abused or do not feel safe at home.   This information is not intended to replace  advice given to you by your health care provider. Make sure you discuss any questions you have with your health care provider.   Document Released: 09/04/2010 Document Revised: 03/12/2014 Document Reviewed: 01/21/2013 Elsevier Interactive Patient Education [employer].

## 2015-11-29 NOTE — Progress Notes (Signed)
Pt is in the office today for establish care Pt states she is not in any pain Pt states she was put on labetalol when she was pregnant because she has high blood pressure Pt states she doesn't know if she still needs the medication because she is out of them

## 2015-11-30 LAB — VITAMIN D 25 HYDROXY (VIT D DEFICIENCY, FRACTURES): Vit D, 25-Hydroxy: 26 ng/mL — ABNORMAL LOW (ref 30–100)

## 2015-12-07 ENCOUNTER — Ambulatory Visit: Payer: Medicaid Other | Admitting: Certified Nurse Midwife

## 2016-02-01 ENCOUNTER — Ambulatory Visit: Payer: Medicaid Other | Admitting: Certified Nurse Midwife

## 2016-02-14 ENCOUNTER — Other Ambulatory Visit: Payer: Self-pay | Admitting: Certified Nurse Midwife

## 2016-02-14 DIAGNOSIS — Z3492 Encounter for supervision of normal pregnancy, unspecified, second trimester: Secondary | ICD-10-CM

## 2016-02-23 ENCOUNTER — Ambulatory Visit: Payer: Medicaid Other | Admitting: Certified Nurse Midwife

## 2016-04-11 ENCOUNTER — Encounter: Payer: Self-pay | Admitting: Certified Nurse Midwife

## 2016-04-24 ENCOUNTER — Ambulatory Visit: Payer: Medicaid Other | Admitting: Obstetrics

## 2016-04-25 ENCOUNTER — Ambulatory Visit: Payer: Medicaid Other | Admitting: Obstetrics

## 2016-05-10 ENCOUNTER — Other Ambulatory Visit: Payer: Self-pay | Admitting: Certified Nurse Midwife

## 2016-05-10 DIAGNOSIS — F32A Depression, unspecified: Secondary | ICD-10-CM

## 2016-05-10 DIAGNOSIS — F329 Major depressive disorder, single episode, unspecified: Secondary | ICD-10-CM

## 2016-05-10 DIAGNOSIS — F419 Anxiety disorder, unspecified: Secondary | ICD-10-CM

## 2016-10-15 ENCOUNTER — Encounter: Payer: Self-pay | Admitting: *Deleted

## 2016-10-15 ENCOUNTER — Telehealth: Payer: Self-pay | Admitting: *Deleted

## 2016-10-15 NOTE — Telephone Encounter (Signed)
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 She is juggling 3 children, household and she is losing the support of her husband. She is taking an antidepressant at this time and she thinks she needs some time to get her situation back under control and calm so she can continue her classes. Some time off would help. Per Dr Clearance CootsHarper- OK to write her out for 6 week period.

## 2016-10-16 ENCOUNTER — Encounter: Payer: Self-pay | Admitting: *Deleted

## 2017-05-29 ENCOUNTER — Other Ambulatory Visit: Payer: Self-pay | Admitting: Obstetrics

## 2017-05-29 DIAGNOSIS — F419 Anxiety disorder, unspecified: Secondary | ICD-10-CM

## 2017-05-29 DIAGNOSIS — F32A Depression, unspecified: Secondary | ICD-10-CM

## 2017-05-29 DIAGNOSIS — F329 Major depressive disorder, single episode, unspecified: Secondary | ICD-10-CM

## 2017-06-10 ENCOUNTER — Emergency Department (HOSPITAL_COMMUNITY): Payer: Self-pay

## 2017-06-10 ENCOUNTER — Other Ambulatory Visit: Payer: Self-pay

## 2017-06-10 ENCOUNTER — Emergency Department (HOSPITAL_COMMUNITY)
Admission: EM | Admit: 2017-06-10 | Discharge: 2017-06-10 | Disposition: A | Discharge status: Home / Self Care | Payer: Self-pay | Attending: Emergency Medicine | Admitting: Emergency Medicine

## 2017-06-10 ENCOUNTER — Encounter (HOSPITAL_COMMUNITY): Payer: Self-pay | Admitting: Neurology

## 2017-06-10 DIAGNOSIS — Y929 Unspecified place or not applicable: Secondary | ICD-10-CM | POA: Insufficient documentation

## 2017-06-10 DIAGNOSIS — S0003XA Contusion of scalp, initial encounter: Secondary | ICD-10-CM | POA: Insufficient documentation

## 2017-06-10 DIAGNOSIS — Z79899 Other long term (current) drug therapy: Secondary | ICD-10-CM | POA: Insufficient documentation

## 2017-06-10 DIAGNOSIS — Y999 Unspecified external cause status: Secondary | ICD-10-CM | POA: Insufficient documentation

## 2017-06-10 DIAGNOSIS — W19XXXA Unspecified fall, initial encounter: Secondary | ICD-10-CM

## 2017-06-10 DIAGNOSIS — Y939 Activity, unspecified: Secondary | ICD-10-CM | POA: Insufficient documentation

## 2017-06-10 DIAGNOSIS — R0789 Other chest pain: Secondary | ICD-10-CM | POA: Insufficient documentation

## 2017-06-10 DIAGNOSIS — Z87891 Personal history of nicotine dependence: Secondary | ICD-10-CM | POA: Insufficient documentation

## 2017-06-10 DIAGNOSIS — W108XXA Fall (on) (from) other stairs and steps, initial encounter: Secondary | ICD-10-CM | POA: Insufficient documentation

## 2017-06-10 DIAGNOSIS — M542 Cervicalgia: Secondary | ICD-10-CM | POA: Insufficient documentation

## 2017-06-10 MED ORDER — ACETAMINOPHEN 500 MG PO TABS
1000.0000 mg | ORAL_TABLET | Freq: Once | ORAL | Status: AC
  Administered 2017-06-10: 1000 mg via ORAL
  Filled 2017-06-10: qty 2

## 2017-06-10 MED ORDER — METHOCARBAMOL 500 MG PO TABS: 500.0000 mg | ORAL_TABLET | Freq: Two times a day (BID) | ORAL | 0 refills | Status: DC

## 2017-06-10 NOTE — Discharge Instructions (Signed)
Imaging today is normal.  Pain and soreness will likely be there for the next 3-5 days, up to a week. Take 1000 mg acetaminophen (tylenol) plus 600 mg ibuprofen for pain every 6-8 hours. Rest. Ice. Robaxin is a muscle relaxer that helps with muscle spasm and tightness after injury.

## 2017-06-10 NOTE — ED Notes (Signed)
Patient transported to X-ray 

## 2017-06-10 NOTE — ED Triage Notes (Signed)
Pt reports this morning she slipped down her steps, b/c her socks were slippery. She slid down the stairs to the bottom, c/o head pain, neck pain. Is ambulatory. Denies LOC. Her whole body is sore, has bump to posterior scalp.

## 2017-06-10 NOTE — ED Provider Notes (Signed)
[facility] Provider Note   CSN: [id number] Arrival date & time: 06/10/17  [id number]     History   Chief Complaint Chief Complaint  Patient presents with  . Fall    HPI Erin Ruiz is a 27 y.o. female here for evaluation after mechanical fall. States that she was at the top of the steps when she thinks she slipped and fell down the entire flight of steps, thinks she may have blacked out for a brief second. Endorses swelling and pain to posterior head, neck pain, left rib cage pain, mild nausea, headache and dizziness. She describes the dizziness as things are dragging. No anticoagulants. No vision changes, vomiting, chest pain, shortness of breath, abdominal pain, low back pain, saddle anesthesia, loss of bladder or bowel control, numbness or tingling distally. Fall was unwitnessed. No modifying factors.  HPI  Past Medical History:  Diagnosis Date  . Depression   . Heart murmur    childhood    Patient Active Problem List   Diagnosis Date Noted  . Preeclampsia 06/01/2015  . Encounter for supervision of other normal pregnancy in first trimester 12/02/2014    Past Surgical History:  Procedure Laterality Date  . TONSILLECTOMY  2004     OB History    Gravida  3   Para  3   Term  3   Preterm      AB      Living  3     SAB      TAB      Ectopic      Multiple  0   Live Births  3            Home Medications    Prior to Admission medications   Medication Sig Start Date End Date Taking? Authorizing Provider  diclofenac sodium (VOLTAREN) 1 % GEL Apply 2 g topically 4 (four) times daily. 11/29/15   Pete GlatterLangeland, Dawn T, MD  escitalopram (LEXAPRO) 20 MG tablet TAKE 1 TABLET BY MOUTH ONCE DAILY 05/29/17   Brock BadHarper, [name], MD  ibuprofen (ADVIL,MOTRIN) 600 MG tablet Take 1 tablet (600 mg total) by mouth every 6 (six) hours. 06/02/15   Orvilla Cornwallenney, Rachelle A, CNM  methocarbamol (ROBAXIN) 500 MG tablet Take 1 tablet (500 mg total)  by mouth 2 (two) times daily. 06/10/17   Liberty HandyGibbons, Brienna Bass J, PA-C  norethindrone (MICRONOR,CAMILA,ERRIN) 0.35 MG tablet Take 1 tablet (0.35 mg total) by mouth daily. 07/27/15   Denney, Rachelle A, CNM  Prenat-Fe Poly-Methfol-FA-DHA (VITAFOL ULTRA) 29-0.6-0.4-200 MG CAPS Take 1 capsule by mouth at bedtime.    [provider]  Prenat-Fe Poly-Methfol-FA-DHA (VITAFOL ULTRA) 29-0.6-0.4-200 MG CAPS TAKE ONE CAPSULE BY MOUTH ONCE DAILY AT  10PM 02/14/16   Brock BadHarper, [name], MD  senna-docusate (SENOKOT-S) 8.6-50 MG tablet Take 2 tablets by mouth 2 (two) times daily. Patient not taking: Reported on 11/29/2015 06/02/15   Roe Coombsenney, Rachelle A, CNM    Family History Family History  Problem Relation Age of Onset  . Hypertension Mother   . Asthma Father     Social History Social History   Tobacco Use  . Smoking status: Former Smoker  Substance Use Topics  . Alcohol use: No  . Drug use: No     Allergies   Patient has no known allergies.   Review of Systems Review of Systems  Gastrointestinal: Positive for nausea.  Musculoskeletal: Positive for back pain (left rib).  Neurological: Positive for dizziness and headaches.  All other systems  reviewed and are negative.    Physical Exam Updated Vital Signs BP 112/75 (BP Location: Right Arm)   Pulse 70   Temp 98.2 F (36.8 C) (Oral)   Resp 16   Ht 5' 3.5" (1.613 m)   Wt 77.1 kg (170 lb)   LMP 05/08/2017   SpO2 100%   BMI 29.64 kg/m   Physical Exam  Constitutional: She is oriented to person, place, and time. She appears well-developed and well-nourished. She is cooperative. She is easily aroused. No distress.  HENT:  Head: Head is with contusion.  Contusion to posterior occipital scalp, no overlaying skin injury.  No facial or nasal bone tenderness, swelling, bruising. No Raccoon's eyes. No Battle's sign. No hemotympanum, bilaterally. No epistaxis, septum midline No intraoral bleeding or injury  Eyes:  Lids normal EOMs and  PERRL intact without pain No conjunctival injection  Neck: Spinous process tenderness and muscular tenderness present.  Midline cervical spinous process tenderness Bilateral cervical paraspinal muscular tenderness Trachea midline  Cardiovascular: Normal rate, regular rhythm, S1 normal, S2 normal and normal heart sounds. Exam reveals no distant heart sounds and no friction rub.  No murmur heard. Pulses:      Carotid pulses are 2+ on the right side, and 2+ on the left side.      Radial pulses are 2+ on the right side, and 2+ on the left side.       Dorsalis pedis pulses are 2+ on the right side, and 2+ on the left side.  Pulmonary/Chest: Effort normal and breath sounds normal. No respiratory distress. She has no decreased breath sounds. She exhibits tenderness.  Left posterior/lateral chest wall tenderness Equal and symmetric chest wall expansion   Abdominal:  Abdomen is soft NTND  Musculoskeletal: Normal range of motion. She exhibits no deformity.  Neurological: She is alert, oriented to person, place, and time and easily aroused.  Alert and oriented to self, place, time and event.  Speech is fluent without obvious dysarthria or dysphasia. Strength 5/5 with hand grip and ankle F/E.   Sensation to light touch intact in hands and feet. Normal gait. No pronator drift. Normal finger-to-nose and finger tapping.  CN I and VIII not tested. CN II-XII grossly intact bilaterally.      ED Treatments / Results  Labs (all labs ordered are listed, but only abnormal results are displayed) Labs Reviewed - No data to display  EKG None  Radiology Dg Ribs Unilateral W/chest Left  Result Date: 06/10/2017 CLINICAL DATA:  Fall down steps.  Left-sided rib pain. EXAM: LEFT RIBS AND CHEST - 3+ VIEW COMPARISON:  None. FINDINGS: Heart size is normal.  The lungs are clear. Dedicated imaging of the left ribs demonstrate no acute or healing fracture. Mild leftward curvature is present at the thoracolumbar  junction. Vertebral body heights are maintained. IMPRESSION: 1. No acute cardiopulmonary disease. 2. No acute or healing fracture. Electronically Signed   By: Marin Roberts M.D.   On: 06/10/2017 11:51   Ct Head Wo Contrast  Result Date: 06/10/2017 CLINICAL DATA:  Larey Seat down steps.  Head and cervical spine pain. EXAM: CT HEAD WITHOUT CONTRAST CT CERVICAL SPINE WITHOUT CONTRAST TECHNIQUE: Multidetector CT imaging of the head and cervical spine was performed following the standard protocol without intravenous contrast. Multiplanar CT image reconstructions of the cervical spine were also generated. COMPARISON:  None. FINDINGS: CT HEAD FINDINGS Brain: No acute infarct, hemorrhage, or mass lesion is present. No significant white matter disease is present. The ventricles are of  normal size. No significant extra-axial fluid collection is present. The brainstem and cerebellum are normal. Vascular: No hyperdense vessel or unexpected calcification. Skull: Calvarium is intact. No significant extracranial soft tissue injury is present. The skull base is within normal limits. Sinuses/Orbits: The paranasal sinuses and mastoid air cells are clear. Globes and orbits are within normal limits bilaterally. CT CERVICAL SPINE FINDINGS Alignment: AP alignment is anatomic. There is some straightening of the normal cervical lordosis. Skull base and vertebrae: The craniocervical junction is normal. Vertebral body heights are maintained. No acute or healing fracture is present. Soft tissues and spinal canal: The soft tissues of the neck are otherwise unremarkable. No significant vascular disease is present. There is no significant adenopathy. Thyroid is unremarkable. No intra-axial hemorrhage is evident. Disc levels:  No focal disc protrusion or stenosis is present. Upper chest: The lung apices are clear. The thoracic inlet is within normal limits. IMPRESSION: 1. No acute trauma to the head or cervical spine. 2. Straightening of the  normal cervical lordosis Electronically Signed   By: Marin Roberts M.D.   On: 06/10/2017 12:33   Ct Cervical Spine Wo Contrast  Result Date: 06/10/2017 CLINICAL DATA:  Larey Seat down steps.  Head and cervical spine pain. EXAM: CT HEAD WITHOUT CONTRAST CT CERVICAL SPINE WITHOUT CONTRAST TECHNIQUE: Multidetector CT imaging of the head and cervical spine was performed following the standard protocol without intravenous contrast. Multiplanar CT image reconstructions of the cervical spine were also generated. COMPARISON:  None. FINDINGS: CT HEAD FINDINGS Brain: No acute infarct, hemorrhage, or mass lesion is present. No significant white matter disease is present. The ventricles are of normal size. No significant extra-axial fluid collection is present. The brainstem and cerebellum are normal. Vascular: No hyperdense vessel or unexpected calcification. Skull: Calvarium is intact. No significant extracranial soft tissue injury is present. The skull base is within normal limits. Sinuses/Orbits: The paranasal sinuses and mastoid air cells are clear. Globes and orbits are within normal limits bilaterally. CT CERVICAL SPINE FINDINGS Alignment: AP alignment is anatomic. There is some straightening of the normal cervical lordosis. Skull base and vertebrae: The craniocervical junction is normal. Vertebral body heights are maintained. No acute or healing fracture is present. Soft tissues and spinal canal: The soft tissues of the neck are otherwise unremarkable. No significant vascular disease is present. There is no significant adenopathy. Thyroid is unremarkable. No intra-axial hemorrhage is evident. Disc levels:  No focal disc protrusion or stenosis is present. Upper chest: The lung apices are clear. The thoracic inlet is within normal limits. IMPRESSION: 1. No acute trauma to the head or cervical spine. 2. Straightening of the normal cervical lordosis Electronically Signed   By: Marin Roberts M.D.   On: 06/10/2017  12:33    Procedures Procedures (including critical care time)  Medications Ordered in ED Medications  acetaminophen (TYLENOL) tablet 1,000 mg (1,000 mg Oral Given 06/10/17 1121)     Initial Impression / Assessment and Plan / ED Course  I have reviewed the triage vital signs and the nursing notes.  Pertinent labs & imaging results that were available during my care of the patient were reviewed by me and considered in my medical decision making (see chart for details).    27 year old here after mechanical fall down an entire flight of steps. Exam, as above remarkable for occipital contusion, midline cervical and paraspinal muscular tenderness, diffuse left posterior/lateral lip or pain. Questionable LOC. No anticoagulants.Grossly normal neuro exam. No red flags associated with head trauma including vomiting,  seizure, battle signs, hemotympanum. Given mechanism of injury, CT head/neck ordered as well as left rib/chest x-rays. Will provide analgesia.   Final Clinical Impressions(s) / ED Diagnoses   Imaging reviewed, negative. Feel patient is appropriate for discharge with conservative management. She has no signs of significant chest, abdominal, back, extremity injury since the fall given reassuring exam as above. She has been ambulatory in the ED without difficulty, tolerating fluids. HA, dizziness, nausea resolved in ED. Considering mild concussion symptoms.  Final diagnoses:  Fall, initial encounter  Contusion of scalp, initial encounter    ED Discharge Orders        Ordered    methocarbamol (ROBAXIN) 500 MG tablet  2 times daily     06/10/17 1245       Liberty Handy, [place] 06/10/17 1457    Melene Plan, DO 06/10/17 1524

## 2018-01-16 ENCOUNTER — Ambulatory Visit (INDEPENDENT_AMBULATORY_CARE_PROVIDER_SITE_OTHER): Payer: Medicaid Other | Admitting: Advanced Practice Midwife

## 2018-01-16 ENCOUNTER — Other Ambulatory Visit (HOSPITAL_COMMUNITY)
Admission: RE | Admit: 2018-01-16 | Discharge: 2018-01-16 | Disposition: A | Discharge status: Home / Self Care | Payer: Medicaid Other | Source: Ambulatory Visit | Attending: Advanced Practice Midwife | Admitting: Advanced Practice Midwife

## 2018-01-16 ENCOUNTER — Encounter: Payer: Self-pay | Admitting: Advanced Practice Midwife

## 2018-01-16 VITALS — BP 120/82 | HR 71 | Ht 64.0 in | Wt 167.4 lb

## 2018-01-16 DIAGNOSIS — Z0000 Encounter for general adult medical examination without abnormal findings: Secondary | ICD-10-CM

## 2018-01-16 DIAGNOSIS — Z01419 Encounter for gynecological examination (general) (routine) without abnormal findings: Secondary | ICD-10-CM | POA: Diagnosis not present

## 2018-01-16 LAB — POCT URINE PREGNANCY: Preg Test, Ur: NEGATIVE

## 2018-01-16 NOTE — Progress Notes (Signed)
GYNECOLOGY ANNUAL PREVENTATIVE CARE ENCOUNTER NOTE  Subjective:   Erin Ruiz is a 27 y.o. G60P3003 female here for a routine annual gynecologic exam.  Current complaints: none. Desires contraception.   Denies abnormal vaginal bleeding, discharge, pelvic pain, problems with intercourse or other gynecologic concerns.    Patient is newly separated from her husband and is in a committed relationship with a female partner. She endorses unprotected sex and concerns for unplanned pregnancy. Patient has taken Plan B twice in the past month and desires more intentional contraception. Patient endorses unprotected sex last Wednesday 01/08/18  Gynecologic History Patient's last menstrual period was 01/11/2018. Contraception: none Last Pap: 12/02/2014. Results were: normal with negative HPV Last mammogram: N/A age 89.   Obstetric History OB History  Gravida Para Term Preterm AB Living  3 3 3     3   SAB TAB Ectopic Multiple Live Births        0 3    # Outcome Date GA Lbr Len/2nd Weight Sex Delivery Anes PTL Lv  3 Term 06/01/15 [redacted]w[redacted]d 05:13 / 00:11 7 lb 12.5 oz (3.53 kg) M Vag-Spont EPI  LIV  2 Term 07/30/13 [redacted]w[redacted]d 08:00 / 00:24 7 lb 9 oz (3.43 kg) F Vag-Spont EPI  LIV  1 Term 12/30/09 [redacted]w[redacted]d  7 lb 1 oz (3.204 kg) F Vag-Spont None  LIV     Birth Comments: No complications    Past Medical History:  Diagnosis Date  . Depression   . Encounter for supervision of other normal pregnancy in first trimester 12/02/2014  . Heart murmur    childhood  . Preeclampsia 06/01/2015    Past Surgical History:  Procedure Laterality Date  . TONSILLECTOMY  2004    Current Outpatient Medications on File Prior to Visit  Medication Sig Dispense Refill  . escitalopram (LEXAPRO) 20 MG tablet TAKE 1 TABLET BY MOUTH ONCE DAILY 30 tablet 9  . ibuprofen (ADVIL,MOTRIN) 600 MG tablet Take 1 tablet (600 mg total) by mouth every 6 (six) hours. 90 tablet 2   No current facility-administered medications on file prior  to visit.     No Known Allergies  Social History:  reports that she has quit smoking. She has never used smokeless tobacco. She reports that she does not drink alcohol or use drugs.  Family History  Problem Relation Age of Onset  . Hypertension Mother   . Asthma Father     The following portions of the patient's history were reviewed and updated as appropriate: allergies, current medications, past family history, past medical history, past social history, past surgical history and problem list.  Review of Systems Pertinent items noted in HPI and remainder of comprehensive ROS otherwise negative.   Objective:  BP 120/82   Pulse 71   Ht 5\' 4"  (1.626 m)   Wt 167 lb 6.4 oz (75.9 kg)   LMP 01/11/2018   BMI 28.73 kg/m  CONSTITUTIONAL: Well-developed, well-nourished female in no acute distress.  HENT:  Normocephalic, atraumatic, External right and left ear normal. Oropharynx is clear and moist EYES: Conjunctivae and EOM are normal. Pupils are equal, round, and reactive to light. No scleral icterus.  NECK: Normal range of motion, supple, no masses.  Normal thyroid.  SKIN: Skin is warm and dry. No rash noted. Not diaphoretic. No erythema. No pallor. MUSCULOSKELETAL: Normal range of motion. No tenderness.  No cyanosis, clubbing, or edema.  2+ distal pulses. NEUROLOGIC: Alert and oriented to person, place, and time. Normal reflexes, muscle tone coordination.  No cranial nerve deficit noted. PSYCHIATRIC: Normal mood and affect. Normal behavior. Normal judgment and thought content. CARDIOVASCULAR: Normal heart rate noted, regular rhythm RESPIRATORY: Clear to auscultation bilaterally. Effort and breath sounds normal, no problems with respiration noted. BREASTS: Symmetric in size. No masses, skin changes, nipple drainage, or lymphadenopathy. ABDOMEN: Soft, normal bowel sounds, no distention noted.  No tenderness, rebound or guarding.  PELVIC: Normal appearing external genitalia; normal  appearing vaginal mucosa and cervix.  Moderate amount of greenish yellow discharge noted during specimen Pap smear.  Normal uterine size, no other palpable masses, no uterine or adnexal tenderness.    Assessment and Plan:  1. Encounter for well woman exam with routine gynecological exam - No concerning findings on physical exam. Explained that [facility] - Discussed LARCs, pros and cons, anticipated return to fertility - Unable to place LARC today due to unprotected sex 7 days ago - Advised patient to take 800mg  Ibuprofen two hours before IUD placement, try to schedule placement during period - POCT urine pregnancy - Cytology - PAP  2. Depression - No current concerns.  - Continues to feel comfortable and in control of her emotions with Lexapro - Just started weekly therapy, both individual and with her daughter - Denies thoughts of self-harm or harm to others  Will follow up results of pap smear and manage accordingly. Routine preventative health maintenance measures emphasized. Please refer to After Visit Summary for other counseling recommendations.   F/U: Patient to schedule IUD placement PRN  Clayton BiblesSamantha Lerone Onder, CNM [facility], [facility]

## 2018-01-16 NOTE — Progress Notes (Signed)
Pt presents for annual.

## 2018-01-16 NOTE — Patient Instructions (Signed)
Exercising to Stay Healthy Exercising regularly is important. It has many health benefits, such as:  Improving your overall fitness, flexibility, and endurance.  Increasing your bone density.  Helping with weight control.  Decreasing your body fat.  Increasing your muscle strength.  Reducing stress and tension.  Improving your overall health.  In order to become healthy and stay healthy, it is recommended that you do moderate-intensity and vigorous-intensity exercise. You can tell that you are exercising at a moderate intensity if you have a higher heart rate and faster breathing, but you are still able to hold a conversation. You can tell that you are exercising at a vigorous intensity if you are breathing much harder and faster and cannot hold a conversation while exercising. How often should I exercise? Choose an activity that you enjoy and set realistic goals. Your health care provider can help you to make an activity plan that works for you. Exercise regularly as directed by your health care provider. This may include:  Doing resistance training twice each week, such as: ? Push-ups. ? Sit-ups. ? Lifting weights. ? Using resistance bands.  Doing a given intensity of exercise for a given amount of time. Choose from these options: ? 150 minutes of moderate-intensity exercise every week. ? 75 minutes of vigorous-intensity exercise every week. ? A mix of moderate-intensity and vigorous-intensity exercise every week.  Children, pregnant women, people who are out of shape, people who are overweight, and older adults may need to consult a health care provider for individual recommendations. If you have any sort of medical condition, be sure to consult your health care provider before starting a new exercise program. What are some exercise ideas? Some moderate-intensity exercise ideas include:  Walking at a rate of 1 mile in 15  minutes.  Biking.  Hiking.  Golfing.  Dancing.  Some vigorous-intensity exercise ideas include:  Walking at a rate of at least 4.5 miles per hour.  Jogging or running at a rate of 5 miles per hour.  Biking at a rate of at least 10 miles per hour.  Lap swimming.  Roller-skating or in-line skating.  Cross-country skiing.  Vigorous competitive sports, such as football, basketball, and soccer.  Jumping rope.  Aerobic dancing.  What are some everyday activities that can help me to get exercise?  Yard work, such as: ? Pushing a lawn mower. ? Raking and bagging leaves.  Washing and waxing your car.  Pushing a stroller.  Shoveling snow.  Gardening.  Washing windows or floors. How can I be more active in my day-to-day activities?  Use the stairs instead of the elevator.  Take a walk during your lunch break.  If you drive, park your car farther away from work or school.  If you take public transportation, get off one stop early and walk the rest of the way.  Make all of your phone calls while standing up and walking around.  Get up, stretch, and walk around every 30 minutes throughout the day. What guidelines should I follow while exercising?  Do not exercise so much that you hurt yourself, feel dizzy, or get very short of breath.  Consult your health care provider before starting a new exercise program.  Wear comfortable clothes and shoes with good support.  Drink plenty of water while you exercise to prevent dehydration or heat stroke. Body water is lost during exercise and must be replaced.  Work out until you breathe faster and your heart beats faster. This information is not   intended to replace advice given to you by your health care provider. Make sure you discuss any questions you have with your health care provider. Document Released: 03/24/2010 Document Revised: 07/28/2015 Document Reviewed: 07/23/2013 Elsevier Interactive Patient Education  2018  Elsevier Inc[employer]  

## 2018-01-17 LAB — CYTOLOGY - PAP
CANDIDA VAGINITIS: NEGATIVE
CHLAMYDIA, DNA PROBE: NEGATIVE
Diagnosis: NEGATIVE
Neisseria Gonorrhea: NEGATIVE
TRICH (WINDOWPATH): NEGATIVE

## 2018-01-21 ENCOUNTER — Telehealth: Payer: Self-pay | Admitting: *Deleted

## 2018-01-21 NOTE — Telephone Encounter (Signed)
Pt called to office stating she is scheduled for IUD but now wants to change to pills. Ask for return call.  Pt states she has decided to take pill and not IUD placement. Pt made aware review with provider.  Pt advised she may have to come in for UPT and then can send Rx for pill.  Pt uses [facility] [address].   Please advise- pt come have provider visit to discuss or change appt to nurse UPT and ?pills to be sent.

## 2018-01-21 NOTE — Telephone Encounter (Signed)
Voicemail left for patient to call clinic to schedule RN visit for pregnancy test. Once she has both two-week period without unprotected sex and negative pregnancy test I will send rx for OCP to her pharmacy. Thanks! Clayton BiblesSamantha Weinhold, CNM 01/21/18  8:00 PM

## 2018-01-28 ENCOUNTER — Ambulatory Visit (INDEPENDENT_AMBULATORY_CARE_PROVIDER_SITE_OTHER): Payer: Medicaid Other

## 2018-01-28 VITALS — BP 106/68 | HR 83 | Wt 174.5 lb

## 2018-01-28 DIAGNOSIS — Z3201 Encounter for pregnancy test, result positive: Secondary | ICD-10-CM

## 2018-01-28 DIAGNOSIS — Z30011 Encounter for initial prescription of contraceptive pills: Secondary | ICD-10-CM

## 2018-01-28 LAB — POCT URINE PREGNANCY: PREG TEST UR: NEGATIVE

## 2018-01-28 NOTE — Progress Notes (Signed)
Presents for 2nd UPT to start OCP.  Last unprotected sex 01/03/18   UPT today is NEGATIVE.  [facility]

## 2018-01-28 NOTE — Progress Notes (Signed)
ATTESTATION OF SUPERVISION OF RN: Evaluation and management procedures were performed by the RN under my supervision and collaboration. I have reviewed the nursing note and chart and agree with the management and plan for this patient.  Samantha Weinhold, CNM  

## 2018-01-29 ENCOUNTER — Other Ambulatory Visit: Payer: Self-pay | Admitting: Advanced Practice Midwife

## 2018-01-29 ENCOUNTER — Telehealth: Payer: Self-pay

## 2018-01-29 MED ORDER — NORGESTIMATE-ETH ESTRADIOL 0.25-35 MG-MCG PO TABS: 1.0000 | ORAL_TABLET | Freq: Every day | ORAL | 11 refills | Status: DC

## 2018-01-29 NOTE — Telephone Encounter (Signed)
Pt called and would like to have her OCP sent to her pharmacy on file.

## 2018-01-29 NOTE — Telephone Encounter (Signed)
Done. My apologies, I thought this was handled by other clinic staff yesterday. SW

## 2018-01-29 NOTE — Progress Notes (Signed)
NO unprotected intercourse for past two weeks, negative pregnancy test in clinic

## 2018-01-29 NOTE — Progress Notes (Signed)
Please advise which OCP to Rx for patient, she is at the Pharmacy.  Thanks

## 2018-02-06 ENCOUNTER — Ambulatory Visit: Payer: Medicaid Other | Admitting: Certified Nurse Midwife

## 2018-04-15 NOTE — Progress Notes (Deleted)
   Subjective:    Patient ID: Erin Ruiz, female    DOB: 1990/07/01, 28 y.o.   MRN: [id number]  HPI:  Erin Ruiz is here to establish as a new pt.  She is a pleasant 28 year old female. PMH: Depression   Patient Care Team    Relationship Specialty Notifications Start End  Patient, No Pcp Per PCP - General General Practice  06/10/17     There are no active problems to display for this patient.    Past Medical History:  Diagnosis Date  . Depression   . Encounter for supervision of other normal pregnancy in first trimester 12/02/2014  . Heart murmur    childhood  . Preeclampsia 06/01/2015     Past Surgical History:  Procedure Laterality Date  . TONSILLECTOMY  2004     Family History  Problem Relation Age of Onset  . Hypertension Mother   . Asthma Father      Social History   Substance and Sexual Activity  Drug Use No     Social History   Substance and Sexual Activity  Alcohol Use No     Social History   Tobacco Use  Smoking Status Former Smoker  Smokeless Tobacco Never Used     Outpatient Encounter Medications as of 04/21/2018  Medication Sig  . escitalopram (LEXAPRO) 20 MG tablet TAKE 1 TABLET BY MOUTH ONCE DAILY  . ibuprofen (ADVIL,MOTRIN) 600 MG tablet Take 1 tablet (600 mg total) by mouth every 6 (six) hours.  . norgestimate-ethinyl estradiol (ORTHO-CYCLEN,SPRINTEC,PREVIFEM) 0.25-35 MG-MCG tablet Take 1 tablet by mouth daily.   No facility-administered encounter medications on file as of 04/21/2018.     Allergies: Patient has no known allergies.  There is no height or weight on file to calculate BMI.  unknown if currently breastfeeding.     Review of Systems     Objective:   Physical Exam        Assessment & Plan:  No diagnosis found.  No problem-specific Assessment & Plan notes found for this encounter.    FOLLOW-UP:  No follow-ups on file.

## 2018-04-21 ENCOUNTER — Ambulatory Visit: Payer: Medicaid Other | Admitting: Adult Health

## 2018-05-20 ENCOUNTER — Ambulatory Visit (INDEPENDENT_AMBULATORY_CARE_PROVIDER_SITE_OTHER): Payer: No Typology Code available for payment source | Admitting: Family Medicine

## 2018-05-20 ENCOUNTER — Encounter: Payer: Self-pay | Admitting: Family Medicine

## 2018-05-20 ENCOUNTER — Other Ambulatory Visit: Payer: Self-pay

## 2018-05-20 VITALS — BP 122/75 | HR 79 | Temp 98.1°F | Resp 17 | Ht 64.0 in | Wt 189.2 lb

## 2018-05-20 DIAGNOSIS — Z1389 Encounter for screening for other disorder: Secondary | ICD-10-CM | POA: Diagnosis not present

## 2018-05-20 DIAGNOSIS — N921 Excessive and frequent menstruation with irregular cycle: Secondary | ICD-10-CM

## 2018-05-20 DIAGNOSIS — Z3202 Encounter for pregnancy test, result negative: Secondary | ICD-10-CM | POA: Diagnosis not present

## 2018-05-20 DIAGNOSIS — Z7689 Persons encountering health services in other specified circumstances: Secondary | ICD-10-CM

## 2018-05-20 DIAGNOSIS — G44229 Chronic tension-type headache, not intractable: Secondary | ICD-10-CM

## 2018-05-20 LAB — POCT URINE PREGNANCY: PREG TEST UR: NEGATIVE

## 2018-05-20 LAB — POCT URINALYSIS DIP (CLINITEK)
BILIRUBIN UA: NEGATIVE
Glucose, UA: NEGATIVE mg/dL
Ketones, POC UA: NEGATIVE mg/dL
Leukocytes, UA: NEGATIVE
NITRITE UA: NEGATIVE
POC PROTEIN,UA: NEGATIVE
Spec Grav, UA: 1.015 (ref 1.010–1.025)
UROBILINOGEN UA: 0.2 U/dL
pH, UA: 6 (ref 5.0–8.0)

## 2018-05-20 MED ORDER — METOCLOPRAMIDE HCL 5 MG PO TABS: 5.0000 mg | ORAL_TABLET | Freq: Three times a day (TID) | ORAL | 1 refills | Status: DC | PRN

## 2018-05-20 MED ORDER — TOPIRAMATE 50 MG PO TABS: 50.0000 mg | ORAL_TABLET | Freq: Two times a day (BID) | ORAL | 3 refills | Status: DC

## 2018-05-20 MED ORDER — TRAZODONE HCL 50 MG PO TABS
50.0000 mg | ORAL_TABLET | Freq: Two times a day (BID) | ORAL | 3 refills | Status: DC
Start: 2018-05-20 — End: 2018-05-20

## 2018-05-20 MED ORDER — TIZANIDINE HCL 4 MG PO CAPS: 4.0000 mg | ORAL_CAPSULE | Freq: Three times a day (TID) | ORAL | 2 refills | Status: DC | PRN

## 2018-05-20 NOTE — Progress Notes (Signed)
Erin FarberMerrisa Ruiz, is a 28 y.o. female  ZOX:[id number]  [id number]  DOB - 06-15-1990  CC:  Chief Complaint  Patient presents with  . Establish Care  . Headache       HPI: Erin BellowsMerrisa is a 28 y.o. female is here today to establish care and evaluation of headaches.    Headache: Patient complains of headache. She does not have a headache at this time. Diagnosed with tension headaches several years ago.  Has been a headaches ever since late adolescence. She has not had a recent eye exam and complains that pain occasionally is most prominent behind both eyes.  She has been prescribed cyclobenzaprine for headaches previously otherwise takes excessive amounts of ibuprofen and Tylenol for headache pain. She had a GI cocktail once but hated the way it made her feel so she is never gone back for evaluation of headaches.  Headaches occur at least 5 days out of 7 weekly. They are often frontal however she occasionally experiencing an occipital headache that radiates down her neck. She characterizes the headache pain as throbbing. She is a caffeine drinker and drinks approximately 2 cups of coffee per day. She has not abruptly stopped her caffeine routine. She reports that over a year ago she was placed on a keto diet and during that time her headaches seem to improve. However she was unable to identify any food triggers that cause headaches to worsen or occur.  She does report if she ever even drinks a small amount of wine the following morning she often wakes up with a headache.  She has been able to associate headaches with occurring after prolonged use of computer and lack of rest. She admits she is not a social drinker and does not indulge in alcohol often.  She is currently taking birth control pills and has not noticed any difference in headache occurrence or frequency since starting the birth control medication.  She is been on birth control for the last 5 or 6 months.  She also reports occasionally after  her headaches occur and have resolved she noticed some puffiness of her face and eyes.  She has attempted relief with allergy medications thinking that could be the source of headaches however antihistamine therapy did not improve headache symptoms.  Current medications: Current Outpatient Medications:  .  escitalopram (LEXAPRO) 20 MG tablet, TAKE 1 TABLET BY MOUTH ONCE DAILY, Disp: 30 tablet, Rfl: 9 .  norgestimate-ethinyl estradiol (ORTHO-CYCLEN,SPRINTEC,PREVIFEM) 0.25-35 MG-MCG tablet, Take 1 tablet by mouth daily., Disp: 1 Package, Rfl: 11   Pertinent family medical history: family history includes Asthma in her father; Hypertension in her mother.   No Known Allergies  Social History   Socioeconomic History  . Marital status: Married    Spouse name: Not on file  . Number of children: Not on file  . Years of education: Not on file  . Highest education level: Not on file  Occupational History  . Not on file  Social Needs  . Financial resource strain: Not on file  . Food insecurity:    Worry: Not on file    Inability: Not on file  . Transportation needs:    Medical: Not on file    Non-medical: Not on file  Tobacco Use  . Smoking status: Former Games developermoker  . Smokeless tobacco: Never Used  Substance and Sexual Activity  . Alcohol use: No  . Drug use: No  . Sexual activity: Yes    Partners: Male    Birth control/protection: None  Lifestyle  . Physical activity:    Days per week: Not on file    Minutes per session: Not on file  . Stress: Not on file  Relationships  . Social connections:    Talks on phone: Not on file    Gets together: Not on file    Attends religious service: Not on file    Active member of club or organization: Not on file    Attends meetings of clubs or organizations: Not on file    Relationship status: Not on file  . Intimate partner violence:    Fear of current or ex partner: Not on file    Emotionally abused: Not on file    Physically abused: Not  on file    Forced sexual activity: Not on file  Other Topics Concern  . Not on file  Social History Narrative  . Not on file    Review of Systems: Pertinent negatives listed in HPI Objective:   Vitals:   05/20/18 0834  BP: 122/75  Pulse: 79  Resp: 17  Temp: 98.1 F (36.7 C)  SpO2: 98%    BP Readings from Last 3 Encounters:  05/20/18 122/75  01/28/18 106/68  01/16/18 120/82    Filed Weights   05/20/18 0834  Weight: 189 lb 3.2 oz (85.8 kg)      Physical Exam: Constitutional: Patient appears well-developed and well-nourished. No distress. HENT: Normocephalic, atraumatic, External right and left ear normal. Oropharynx is clear and moist.  Eyes: Conjunctivae and EOM are normal. PERRLA, no scleral icterus. Neck: Normal ROM. Neck supple. No JVD. No tracheal deviation. No thyromegaly. CVS: RRR, S1/S2 +, no murmurs, no gallops, no carotid bruit.  Pulmonary: Effort and breath sounds normal, no stridor, rhonchi, wheezes, rales.  Abdominal: Soft. BS +, no distension, tenderness, rebound or guarding.  Musculoskeletal: Normal range of motion. No edema and no tenderness.  Neuro: Alert. Normal muscle tone coordination. Normal gait. BUE and BLE strength 5/5.  Bilateral hand grips symmetrical. No cranial nerve deficit. Negative nystagmus. Skin: Skin is warm and dry. No rash noted. Not diaphoretic. No erythema. No pallor. Psychiatric: Normal mood and affect. Behavior, judgment, thought content normal.  Lab Results (prior encounters)  Lab Results  Component Value Date   WBC 6.7 11/29/2015   HGB 13.0 11/29/2015   HCT 39.2 11/29/2015   MCV 86.9 11/29/2015   PLT 231 11/29/2015   Lab Results  Component Value Date   CREATININE 0.45 05/31/2015   BUN 6 05/31/2015   NA 137 05/31/2015   K 3.9 05/31/2015   CL 107 05/31/2015   CO2 21 (L) 05/31/2015    Lab Results  Component Value Date   HGBA1C 5.30 03/03/2014       Assessment and plan:  1. Encounter to establish care  2.  Screening for blood or protein in urine - POCT URINALYSIS DIP (CLINITEK) - POCT urine pregnancy  3. Chronic tension-type headache, not intractable -Educated on NSAID and acetaminophen overuse.  Advised overuse can cause rebound headaches. Will trial the following for prevention therapy: Topamax 50 mg once daily x 1 week.  Then increase to 50 mg twice daily. For acute headaches trial Tinizidine 4 mg of the 3 times a day as needed for headaches.  Cautioned that medication may induce drowsiness , if dizziness occurs only take at bedtime. Reglan prescribed for daytime acute headaches as needed, if unable to tolerate Tinizidine.  4. Irregular intermenstrual bleeding ( biweekly spotting) -Contact OBGYN to discuss changes to OCP  Return in about 8 weeks (around 07/15/2018) for Fasting labs and headache follow-up.   Patient given extra medications given the current active coronavirus may impact ability to follow-up here at primary care office.  Wanted to ensure patient has adequate amount of medication if follow-up visit has to be extended over than 2 months.  A total of 30 minutes spent, greater than 50 % of this time was spent counseling and coordination of care.    The patient was given clear instructions to go to ER or return to medical center if symptoms don't improve, worsen or new problems develop. The patient verbalized understanding. The patient was advised  to call and obtain lab results if they haven't heard anything from out office within 7-10 business days.  Joaquin Courts, FNP Primary Care at [facility] [address], [place] [address] [phone]: [phone]    This note has been created with Dragon speech recognition software and Paediatric nurse. Any transcriptional errors are unintentional.

## 2018-05-20 NOTE — Patient Instructions (Addendum)
Thank you for choosing Primary Care at [facility] to be your medical home!    Erin Ruiz was seen by Joaquin Courts, FNP today.   Erin Ruiz's primary care provider is Bing Neighbors, FNP.   For the best care possible, you should try to see Joaquin Courts, FNP-C whenever you come to the clinic.   We look forward to seeing you again soon!  If you have any questions about your visit today, please call us at [phone] or feel free to reach your primary care provider via MyChart.     Topiramate for headache prevention Start: 50 mg, 1 pill at bedtime for 7 days. Increase to 50 mg twice daily after 7 days.  Do not abruptly stop medication.  Take Zanaflex 4 mg up to 3 times daily for acute headache pain. If this causes drowsiness, take only at bedtime.  Metoclopramide 5 mg no more than 3 times daily as needed for headaches.    Return for follow-up in 8 weeks to reevaluate treatment.    Tension Headache, Adult A tension headache is pain, pressure, or aching in your head. Tension headaches can last from 30 minutes to several days. Follow these instructions at home: Managing pain  Take over-the-counter and prescription medicines only as told by your doctor.  When you have a headache, lie down in a dark, quiet room.  If told, put ice on your head and neck: ? Put ice in a plastic bag. ? Place a towel between your skin and the bag. ? Leave the ice on for 20 minutes, 2-3 times a day.  If told, put heat on the back of your neck. Do this as often as your doctor tells you to. Use the kind of heat that your doctor recommends, such as a moist heat pack or a heating pad. ? Place a towel between your skin and the heat. ? Leave the heat on for 20-30 minutes. ? Remove the heat if your skin turns bright red. Eating and drinking  Eat meals on a regular schedule.  Watch how much alcohol you drink: ? If you are a woman and are not pregnant, do not drink more than 1 drink a  day. ? If you are a man, do not drink more than 2 drinks a day.  Drink enough fluid to keep your pee (urine) pale yellow.  Do not use a lot of caffeine, or stop using caffeine. Lifestyle  Get enough sleep. Get 7-9 hours of sleep each night. Or get the amount of sleep that your doctor tells you to.  At bedtime, remove all electronic devices from your room. Examples of electronic devices are computers, phones, and tablets.  Find ways to lessen your stress. Some things that can lessen stress are: ? Exercise. ? Deep breathing. ? Yoga. ? Music. ? Positive thoughts.  Sit up straight. Do not tighten (tense) your muscles.  Do not use any products that have nicotine or tobacco in them, such as cigarettes and e-cigarettes. If you need help quitting, ask your doctor. General instructions   Keep all follow-up visits as told by your doctor. This is important.  Avoid things that can bring on headaches. Keep a journal to find out if certain things bring on headaches. For example, write down: ? What you eat and drink. ? How much sleep you get. ? Any change to your diet or medicines. Contact a doctor if:  Your headache does not get better.  Your headache comes back.  You  have a headache and sounds, light, or smells bother you.  You feel sick to your stomach (nauseous) or you throw up (vomit).  Your stomach hurts. Get help right away if:  You suddenly get a very bad headache along with any of these: ? A stiff neck. ? Feeling sick to your stomach. ? Throwing up. ? Feeling weak. ? Trouble seeing. ? Feeling short of breath. ? A rash. ? Feeling unusually sleepy. ? Trouble speaking. ? Pain in your eye or ear. ? Trouble walking or balancing. ? Feeling like you will pass out (faint). ? Passing out. Summary  A tension headache is pain, pressure, or aching in your head.  Tension headaches can last from 30 minutes to several days.  Lifestyle changes and medicines may help relieve  pain. This information is not intended to replace advice given to you by your health care provider. Make sure you discuss any questions you have with your health care provider. Document Released: 05/16/2009 Document Revised: 06/01/2016 Document Reviewed: 06/01/2016 Elsevier Interactive Patient Education  2019 [employer].  .

## 2018-05-22 ENCOUNTER — Telehealth: Payer: Self-pay

## 2018-05-22 NOTE — Telephone Encounter (Signed)
Returned call and pt states that she would like to change her BC to another brand of pills due to ongoing spotting. Advised pt that I will route request to provider.

## 2018-05-23 ENCOUNTER — Telehealth: Payer: Self-pay

## 2018-05-23 NOTE — Telephone Encounter (Signed)
Returned call to advise of provider recommendations. No answer, left vm.

## 2018-05-23 NOTE — Telephone Encounter (Signed)
Returned call, no answer, left vm about needing appt.

## 2018-05-26 ENCOUNTER — Other Ambulatory Visit: Payer: Self-pay | Admitting: Obstetrics and Gynecology

## 2018-05-26 ENCOUNTER — Other Ambulatory Visit: Payer: Self-pay

## 2018-05-26 ENCOUNTER — Telehealth: Payer: Self-pay

## 2018-05-26 MED ORDER — DROSPIRENONE 4 MG PO TABS: 1.0000 | ORAL_TABLET | Freq: Every day | ORAL | 4 refills | Status: DC

## 2018-05-26 MED ORDER — DROSPIRENONE 4 MG PO TABS
1.0000 | ORAL_TABLET | Freq: Every day | ORAL | 4 refills | Status: DC
Start: 2018-05-26 — End: 2018-05-26

## 2018-05-26 NOTE — Telephone Encounter (Signed)
Returned call, advised that provider sent in new rx for [facility] pills.

## 2018-05-29 ENCOUNTER — Encounter: Payer: Self-pay | Admitting: Family Medicine

## 2018-05-29 ENCOUNTER — Ambulatory Visit (INDEPENDENT_AMBULATORY_CARE_PROVIDER_SITE_OTHER): Payer: No Typology Code available for payment source | Admitting: Family Medicine

## 2018-05-29 ENCOUNTER — Other Ambulatory Visit: Payer: Self-pay

## 2018-05-29 DIAGNOSIS — J029 Acute pharyngitis, unspecified: Secondary | ICD-10-CM | POA: Diagnosis not present

## 2018-05-29 DIAGNOSIS — B9789 Other viral agents as the cause of diseases classified elsewhere: Secondary | ICD-10-CM | POA: Diagnosis not present

## 2018-05-29 DIAGNOSIS — J988 Other specified respiratory disorders: Secondary | ICD-10-CM

## 2018-05-29 DIAGNOSIS — Z20828 Contact with and (suspected) exposure to other viral communicable diseases: Secondary | ICD-10-CM

## 2018-05-29 MED ORDER — LIDOCAINE VISCOUS HCL 2 % MT SOLN: 15.0000 mL | OROMUCOSAL | 0 refills | Status: DC | PRN

## 2018-05-29 MED ORDER — BENZONATATE 100 MG PO CAPS
100.0000 mg | ORAL_CAPSULE | Freq: Three times a day (TID) | ORAL | 0 refills | Status: DC | PRN
Start: 2018-05-29 — End: 2018-07-15

## 2018-05-29 NOTE — Progress Notes (Signed)
Patient ID: Erin Ruiz, female    DOB: [birth year], 28 y.o.   MRN: [id number]  PCP: Bing Neighbors, FNP  No chief complaint on file.   Subjective:  HPI  Erin Ruiz is a 28 y.o. female consents to completing today's visit via a telephonic encounter. Patient is at home during today's encounter.  Provider is in office at primary care location.  COVID-19 screening completed: No recent travel out of the state, no known contact with a son who was tested positive for corona virus, no shortness of breath or fever.  Complaint: Cough and Sore throat  Onset of current symptoms: 1 day Symptoms: itchy throat, cough began later on yesterday. Cough exacerbates soreness of throat. Taken one dose of  Ibuprofen, yesterday.  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  She endorses sick contact as her daughter was diagnosed with a viral respiratory infection earlier this week. She has taken her temperature and she is afebrile.  Cough is nonproductive.  She is not experiencing any shortness of breath, wheezing, or chest tightness.  Social History   Socioeconomic History  . Marital status: Married    Spouse name: Not on file  . Number of children: Not on file  . Years of education: Not on file  . Highest education level: Not on file  Occupational History  . Not on file  Social Needs  . Financial resource strain: Not on file  . Food insecurity:    Worry: Not on file    Inability: Not on file  . Transportation needs:    Medical: Not on file    Non-medical: Not on file  Tobacco Use  . Smoking status: Former Games developer  . Smokeless tobacco: Never Used  Substance and Sexual Activity  . Alcohol use: No  . Drug use: No  . Sexual activity: Yes    Partners: Male    Birth control/protection: None  Lifestyle  . Physical activity:    Days per week: Not on file    Minutes per session: Not on file  . Stress: Not on file   Relationships  . Social connections:    Talks on phone: Not on file    Gets together: Not on file    Attends religious service: Not on file    Active member of club or organization: Not on file    Attends meetings of clubs or organizations: Not on file    Relationship status: Not on file  . Intimate partner violence:    Fear of current or ex partner: Not on file    Emotionally abused: Not on file    Physically abused: Not on file    Forced sexual activity: Not on file  Other Topics Concern  . Not on file  Social History Narrative  . Not on file    Family History  Problem Relation Age of Onset  . Hypertension Mother   . Asthma Father     Review of Systems  Pertinent negatives listed in HPI There are no active problems to display for this patient.  No Known Allergies  Prior to Admission medications   Medication Sig Start Date End Date Taking? Authorizing Provider  Drospirenone (SLYND) 4 MG TABS Take 1 tablet by mouth daily. 05/26/18   Constant, Peggy, MD  escitalopram (LEXAPRO) 20 MG tablet TAKE 1 TABLET BY MOUTH ONCE DAILY 05/29/17   Brock Bad, MD  metoCLOPramide (  REGLAN) 5 MG tablet Take 1 tablet (5 mg total) by mouth 3 (three) times daily as needed (headaches, acute). 05/20/18   Bing Neighbors, FNP  norgestimate-ethinyl estradiol (ORTHO-CYCLEN,SPRINTEC,PREVIFEM) 0.25-35 MG-MCG tablet Take 1 tablet by mouth daily. 01/29/18   Calvert Cantor, CNM  tiZANidine (ZANAFLEX) 4 MG capsule Take 1 capsule (4 mg total) by mouth 3 (three) times daily as needed (acute headaches). 05/20/18   Bing Neighbors, FNP  topiramate (TOPAMAX) 50 MG tablet Take 1 tablet (50 mg total) by mouth 2 (two) times daily. Start 50 mg once weekly x 1 week. 05/20/18   Bing Neighbors, FNP     Assessment & Plan:  1. Viral respiratory illness 2. Sore throat Patient explained that based on symptoms she is likely suffering from a viral upper respiratory illness which would likely resolve  with time, rest, and supportive treatment.  Will prescribe benzonatate to reduce cough.  For sore throat, continue ibuprofen as directed  and will add lidocaine viscous for relief of throat pain. Patient advised to monitor temperature throughout the day.  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  I have provided patient with a work note excusing her from work today and tomorrow if needed.  Patient also advised to follow-up if symptoms worsen or if she develops new symptoms.   A total of 15 minutes spent obtaining history, discussing symptoms, discussing differentials, prescribing and discussing indications for treatment. Provided education.     Joaquin Courts, FNP Primary Care at [facility] [address] [place] [address] [phone]: [phone]

## 2018-05-29 NOTE — Patient Instructions (Signed)
Viral Respiratory Infection  A viral respiratory infection is an illness that affects parts of the body that are used for breathing. These include the lungs, nose, and throat. It is caused by a germ called a virus.  Some examples of this kind of infection are:  · A cold.  · The flu (influenza).  · A respiratory syncytial virus (RSV) infection.  A person who gets this illness may have the following symptoms:  · A stuffy or runny nose.  · Yellow or green fluid in the nose.  · A cough.  · Sneezing.  · Tiredness (fatigue).  · Achy muscles.  · A sore throat.  · Sweating or chills.  · A fever.  · A headache.  Follow these instructions at home:  Managing pain and congestion  · Take over-the-counter and prescription medicines only as told by your doctor.  · If you have a sore throat, gargle with salt water. Do this 3-4 times per day or as needed. To make a salt-water mixture, dissolve ½-1 tsp of salt in 1 cup of warm water. Make sure that all the salt dissolves.  · Use nose drops made from salt water. This helps with stuffiness (congestion). It also helps soften the skin around your nose.  · Drink enough fluid to keep your pee (urine) pale yellow.  General instructions    · Rest as much as possible.  · Do not drink alcohol.  · Do not use any products that have nicotine or tobacco, such as cigarettes and e-cigarettes. If you need help quitting, ask your doctor.  · Keep all follow-up visits as told by your doctor. This is important.  How is this prevented?    · Get a flu shot every year. Ask your doctor when you should get your flu shot.  · Do not let other people get your germs. If you are sick:  ? Stay home from work or school.  ? Wash your hands with soap and water often. Wash your hands after you cough or sneeze. If soap and water are not available, use hand sanitizer.  · Avoid contact with people who are sick during cold and flu season. This is in fall and winter.  Get help if:  · Your symptoms last for 10 days or  longer.  · Your symptoms get worse over time.  · You have a fever.  · You have very bad pain in your face or forehead.  · Parts of your jaw or neck become very swollen.  Get help right away if:  · You feel pain or pressure in your chest.  · You have shortness of breath.  · You faint or feel like you will faint.  · You keep throwing up (vomiting).  · You feel confused.  Summary  · A viral respiratory infection is an illness that affects parts of the body that are used for breathing.  · Examples of this illness include a cold, the flu, and respiratory syncytial virus (RSV) infection.  · The infection can cause a runny nose, cough, sneezing, sore throat, and fever.  · Follow what your doctor tells you about taking medicines, drinking lots of fluid, washing your hands, resting at home, and avoiding people who are sick.  This information is not intended to replace advice given to you by your health care provider. Make sure you discuss any questions you have with your health care provider.  Document Released: 02/02/2008 Document Revised: 04/01/2017 Document Reviewed: 04/01/2017  Elsevier   Interactive Patient Education © 2019 Elsevier Inc.

## 2018-06-05 ENCOUNTER — Telehealth: Payer: Self-pay | Admitting: Family Medicine

## 2018-06-05 NOTE — Telephone Encounter (Signed)
Paperwork received on @TODAY @   Type of paperwork: [employer] absence management  Route received: Fax   Has patient completed their portion of the paperwork: yes    Has office staff updated demographics of paperwork: yes   Paperwork routed to provider :  yes

## 2018-06-09 NOTE — Telephone Encounter (Signed)
Please fax [employer] documentation, include office note, and include letter from EMR indicating patient was written out of work on 05/29/18. Please return faxed confirmation and copy of paperwork back to provider.

## 2018-06-09 NOTE — Telephone Encounter (Signed)
[employer] paperwork, office note & work note faxed to [phone]). Awaiting confirmation.

## 2018-06-09 NOTE — Telephone Encounter (Signed)
Confirmation received. Returned copy of paperwork & confirmation to provider.

## 2018-06-17 ENCOUNTER — Other Ambulatory Visit: Payer: Self-pay | Admitting: Family Medicine

## 2018-06-17 ENCOUNTER — Encounter: Payer: Self-pay | Admitting: Family Medicine

## 2018-06-17 ENCOUNTER — Telehealth: Payer: Self-pay

## 2018-06-17 DIAGNOSIS — F419 Anxiety disorder, unspecified: Secondary | ICD-10-CM

## 2018-06-17 DIAGNOSIS — F32A Depression, unspecified: Secondary | ICD-10-CM

## 2018-06-17 DIAGNOSIS — F329 Major depressive disorder, single episode, unspecified: Secondary | ICD-10-CM

## 2018-06-17 MED ORDER — ESCITALOPRAM OXALATE 20 MG PO TABS: 20.0000 mg | ORAL_TABLET | Freq: Every day | ORAL | 3 refills | Status: DC

## 2018-06-17 NOTE — Telephone Encounter (Signed)
  MyChart message sent to patient: 1 month supply of Antidepressant sent to [facility].  Please have your PCP manage and treat your depression and refill RX.  Thanks

## 2018-06-17 NOTE — Progress Notes (Signed)
Refilled medication of lexapro

## 2018-07-16 ENCOUNTER — Telehealth: Payer: No Typology Code available for payment source | Admitting: Family Medicine

## 2018-08-13 ENCOUNTER — Ambulatory Visit: Payer: No Typology Code available for payment source | Admitting: *Deleted

## 2018-08-13 ENCOUNTER — Other Ambulatory Visit: Payer: Self-pay

## 2018-08-13 ENCOUNTER — Other Ambulatory Visit (HOSPITAL_COMMUNITY)
Admission: RE | Admit: 2018-08-13 | Discharge: 2018-08-13 | Disposition: A | Discharge status: Home / Self Care | Payer: No Typology Code available for payment source | Source: Ambulatory Visit | Attending: Obstetrics | Admitting: Obstetrics

## 2018-08-13 DIAGNOSIS — N760 Acute vaginitis: Secondary | ICD-10-CM | POA: Insufficient documentation

## 2018-08-13 DIAGNOSIS — Z113 Encounter for screening for infections with a predominantly sexual mode of transmission: Secondary | ICD-10-CM | POA: Diagnosis present

## 2018-08-13 NOTE — Progress Notes (Signed)
Agree with A & P. 

## 2018-08-13 NOTE — Progress Notes (Signed)
Pt is in office for self swab.  Pt had recent symptoms of vaginitis and tried Monistat tx OTC. Pt states she is still having increase in d/c and would like for check up, including STD's. Pt preformed self swab today and sent to lab. Pt made aware she will be contacted with any abnormal results.  Pt has no other concerns today.

## 2018-08-14 LAB — CERVICOVAGINAL ANCILLARY ONLY
Bacterial vaginitis: NEGATIVE
Candida vaginitis: NEGATIVE
Chlamydia: NEGATIVE
Neisseria Gonorrhea: NEGATIVE
Trichomonas: NEGATIVE

## 2018-08-18 ENCOUNTER — Encounter: Payer: Self-pay | Admitting: *Deleted

## 2018-09-09 ENCOUNTER — Other Ambulatory Visit: Payer: Self-pay

## 2018-09-09 ENCOUNTER — Ambulatory Visit (INDEPENDENT_AMBULATORY_CARE_PROVIDER_SITE_OTHER): Payer: No Typology Code available for payment source | Admitting: Obstetrics and Gynecology

## 2018-09-09 ENCOUNTER — Encounter: Payer: Self-pay | Admitting: Obstetrics and Gynecology

## 2018-09-09 ENCOUNTER — Other Ambulatory Visit (HOSPITAL_COMMUNITY)
Admission: RE | Admit: 2018-09-09 | Discharge: 2018-09-09 | Disposition: A | Discharge status: Home / Self Care | Payer: No Typology Code available for payment source | Source: Ambulatory Visit | Attending: Obstetrics and Gynecology | Admitting: Obstetrics and Gynecology

## 2018-09-09 VITALS — BP 122/78 | HR 81 | Ht 64.0 in | Wt 178.8 lb

## 2018-09-09 DIAGNOSIS — R102 Pelvic and perineal pain: Secondary | ICD-10-CM

## 2018-09-09 DIAGNOSIS — R55 Syncope and collapse: Secondary | ICD-10-CM | POA: Diagnosis not present

## 2018-09-09 NOTE — Progress Notes (Signed)
Patient is in the office reporting that on 09-03-18 she had intercourse and experienced sharp pelvic pain, which caused her to be nauseous and having to stop. Pt reports that this only happens with one particular person, but it does not happen every time; previously occurred back in Nov too.  Pt reports that she has had bleeding a few days after and had to take motrin due to the cramping. LMP 08-22-18

## 2018-09-09 NOTE — Progress Notes (Addendum)
  GYNECOLOGY PROGRESS NOTE  History:  28 y.o. G3P3003 presents to [facility] office today for problem gyn visit. She reports pelvic pain and nausea with SI. She reports that she and this partner have been sexually active since 01/2018. She reports that his penis is larger than the previous partner she had and not sure if that has anything to do with it. She reports that the pain is so intense that she gets nauseated to the point that she feels like she is "going to throw up everywhere." She denies that the pelvic pain & nausea happen every time they have SI; only sporadic. She states that when it happens, it is "very embarrassing to know that she will potentially throw up all over sexual partner." She denies h/a, dizziness, shortness of breath, or fever/chills.    The following portions of the patient's history were reviewed and updated as appropriate: allergies, current medications, past family history, past medical history, past social history, past surgical history and problem list. Last pap smear on 01/16/2018 was normal.  Review of Systems:  Pertinent items are noted in HPI.   Objective:  Physical Exam Blood pressure 122/78, pulse 81, height 5\' 4"  (1.626 m), weight 178 lb 12.8 oz (81.1 kg), last menstrual period 08/22/2018, unknown if currently breastfeeding. VS reviewed, nursing note reviewed,  Constitutional: well developed, well nourished, no distress HEENT: normocephalic CV: normal rate Pulm/chest wall: normal effort Breast Exam: deferred Abdomen: soft Neuro: alert and oriented x 3 Skin: warm, dry Psych: affect normal Pelvic exam: Cervix pink, visually closed, without lesion, scant white creamy discharge, vaginal walls and external genitalia normal Bimanual exam: Cervix 0/long/high, firm, anterior, (+) CMT*, uterus nontender, nonenlarged, adnexa without tenderness, enlargement, or mass  *Patient had significant vasovagal response (without syncope) during bimanual exam -- emesis  bag, cold compress and water given  after 15 mins observation pt's status improved  Assessment & Plan:  1. Pelvic pain  - Cervicovaginal ancillary only( [place])  2. Vasovagal response  - Discussed possible strengthening of pelvic floor muscles could possibly help with forcible manipulation during SI - Refer to [facility] for pelvic floor rehab  - Recommend buying benwa balls from [facility] to also help strengthen pelvic floor muscles - Note given to be OOW until tomorrow 09/10/2018 - F/U if sx's worsen or not improved  - Patient verbalized an understanding of the plan of care and agrees.     Laury Deep, CNM 9:35 AM

## 2018-09-09 NOTE — Patient Instructions (Signed)
[facility]  [address] [place], [place] [address] [phone]  Call to get scheduled for pelvic floor therapy

## 2018-09-10 ENCOUNTER — Other Ambulatory Visit: Payer: Self-pay | Admitting: Obstetrics and Gynecology

## 2018-09-10 DIAGNOSIS — B9689 Other specified bacterial agents as the cause of diseases classified elsewhere: Secondary | ICD-10-CM

## 2018-09-10 LAB — CERVICOVAGINAL ANCILLARY ONLY
Bacterial vaginitis: POSITIVE — AB
Candida vaginitis: NEGATIVE
Chlamydia: NEGATIVE
Neisseria Gonorrhea: NEGATIVE
Trichomonas: NEGATIVE

## 2018-09-10 MED ORDER — METRONIDAZOLE 500 MG PO TABS: 500.0000 mg | ORAL_TABLET | Freq: Two times a day (BID) | ORAL | 0 refills | Status: DC

## 2018-09-10 NOTE — Progress Notes (Signed)
Rx sent for BV and pt notified via [place], CNM

## 2018-10-13 ENCOUNTER — Encounter: Payer: Self-pay | Admitting: Family Medicine

## 2018-10-20 ENCOUNTER — Encounter: Payer: Self-pay | Admitting: Family Medicine

## 2018-10-20 ENCOUNTER — Telehealth (INDEPENDENT_AMBULATORY_CARE_PROVIDER_SITE_OTHER): Payer: No Typology Code available for payment source | Admitting: Family Medicine

## 2018-10-20 DIAGNOSIS — F339 Major depressive disorder, recurrent, unspecified: Secondary | ICD-10-CM | POA: Diagnosis not present

## 2018-10-20 DIAGNOSIS — F411 Generalized anxiety disorder: Secondary | ICD-10-CM | POA: Diagnosis not present

## 2018-10-20 DIAGNOSIS — F430 Acute stress reaction: Secondary | ICD-10-CM | POA: Diagnosis not present

## 2018-10-20 MED ORDER — HYDROXYZINE HCL 25 MG PO TABS: ORAL_TABLET | ORAL | 1 refills | Status: DC

## 2018-10-20 MED ORDER — ESCITALOPRAM OXALATE 10 MG PO TABS: ORAL_TABLET | ORAL | 3 refills | Status: DC

## 2018-10-20 NOTE — Progress Notes (Deleted)
States that anxiety is

## 2018-10-20 NOTE — Progress Notes (Signed)
Virtual Visit via Video Note  I connected with Erin Ruiz on 10/20/18 at 10:50 AM EDT by a video enabled telemedicine application and verified that I am speaking with the correct person using two identifiers.  Location: Patient: Home Provider: [place] Office   I discussed the limitations of evaluation and management by telemedicine and the availability of in person appointments. The patient expressed understanding and agreed to proceed.  History of Present Illness:        28 year old female with history of anxiety and depression who is seen in follow-up of recent increase in anxiety symptoms as well as increased depression as she states that she and her husband are in the process of separating.  Patient states that over the weekend she also found out that someone she recently met in with her when she thought that she was in a relationship, also was dating 3 other girls that the patient knows.  Patient also has the additional stress of home schooling 2 of her 3 children.  Patient states that she is afraid that her children will fall behind in school.  She has a child entering third grade and a child who will enter kindergarten as well as one child who is not yet school-age.  Patient states that financially she almost feels as if it would be better for her to not work and stay home with her children than paying for childcare and afterschool care which she is not sure she can afford by herself.  She also states that all of last week she was unable to attend work.  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  Patient is currently taking Lexapro 20 mg daily but she states that with her increased stress she does not believe that the medication is working.  Patient feels anxious and tearful.  She also has difficulty sleeping.  She is not currently attending any counseling.  She states that she had been told after  prior visit here that she would be contacted by the social worker but states that this has not so far happened.  She states that she is aware of employee counseling through emails received at work and her manager also mentioned that the patient should call employee counseling but patient has not yet done so.  She denies any suicidal thoughts or ideations but states that she is very stressed and sometimes feels as if she wants to "give up".  She feels that between work, childcare, her marital situation and home schooling that she is overwhelmed.  She denies any issues with fever or chills, no chest pain or palpitations, no shortness of breath or cough.  No abdominal pain, occasional mild nausea.  Patient with sleep difficulty and fatigue.    Observations/Objective: Well-nourished, well-developed female in no acute distress initially.  Patient did become slightly tearful when talking about the issues that are causing her to feel anxious and depressed.  Patient exhibited normal judgment.  Mood was anxious.  Patient with normal speech pattern and engaged appropriately.  Assessment and Plan: 1. Anxiety as acute reaction to exceptional stress Social work referral placed and patient has also been asked to contact employee assistance program for counseling through her workplace.  Patient will increase her dose of Lexapro from her current 20 mg daily to 30 mg.  Hydroxyzine added and prescription written so that patient may take half of a pill, 12.5 mg up  to 3 times daily as needed for anxiety and 1 to 2 tablets at bedtime to help with sleep.  Patient is to follow-up in 1 week but sooner if she has any increased anxiety or depression.  Patient is aware that she should go to the emergency department for further evaluation should she develop issues with suicidal thoughts or ideations. - Ambulatory referral to Social Work - hydrOXYzine (ATARAX/VISTARIL) 25 MG tablet; 1/2 tablet up to 3 times daily as needed for anxiety  and 1 to 2 tablets at bedtime as needed for sleep  Dispense: 45 tablet; Refill: 1 - escitalopram (LEXAPRO) 10 MG tablet; Take 1 pill daily (10 mg) in addition to current 20 mg pill daily  Dispense: 30 tablet; Refill: 3  2. Depression, recurrent ([place]) Social work referral placed and patient is also been encouraged to follow-up with counseling through her employer.  Patient is having dose of her Lexapro increased from current 20 mg to 30 mg and provided with medication to help with sleep and anxiety. - escitalopram (LEXAPRO) 10 MG tablet; Take 1 pill daily (10 mg) in addition to current 20 mg pill daily  Dispense: 30 tablet; Refill: 3  Follow Up Instructions:Return in about 1 week (around 10/27/2018) for Anxiety/depression.  Sooner if needed    I discussed the assessment and treatment plan with the patient. The patient was provided an opportunity to ask questions and all were answered. The patient agreed with the plan and demonstrated an understanding of the instructions.   The patient was advised to call back or seek an in-person evaluation if the symptoms worsen or if the condition fails to improve as anticipated.  I provided 16 minutes of non-face-to-face time during this encounter.   Antony Blackbird, MD

## 2018-10-27 ENCOUNTER — Telehealth: Payer: No Typology Code available for payment source | Admitting: Family Medicine

## 2018-11-05 ENCOUNTER — Telehealth: Payer: Self-pay | Admitting: Licensed Clinical Social Worker

## 2018-11-05 NOTE — Telephone Encounter (Signed)
Call placed to patient in attempt to follow up with [facility] consult from PCP. LCSW left message requesting a return call.  

## 2018-12-24 ENCOUNTER — Encounter: Payer: Self-pay | Admitting: Obstetrics

## 2018-12-24 ENCOUNTER — Other Ambulatory Visit: Payer: Self-pay

## 2018-12-24 ENCOUNTER — Ambulatory Visit (INDEPENDENT_AMBULATORY_CARE_PROVIDER_SITE_OTHER): Payer: No Typology Code available for payment source

## 2018-12-24 DIAGNOSIS — N926 Irregular menstruation, unspecified: Secondary | ICD-10-CM

## 2018-12-24 DIAGNOSIS — Z3201 Encounter for pregnancy test, result positive: Secondary | ICD-10-CM

## 2018-12-24 LAB — POCT URINE PREGNANCY: Preg Test, Ur: POSITIVE — AB

## 2018-12-24 NOTE — Progress Notes (Signed)
Pt presents to clinic for UPT.  Test positive. LMP 11/07/18.  Pt given estimated due date. Pt advised to come back the week of Nov. 16 for new OB appointment. -EH/RMA

## 2018-12-26 ENCOUNTER — Inpatient Hospital Stay (HOSPITAL_COMMUNITY): Payer: No Typology Code available for payment source

## 2018-12-26 ENCOUNTER — Telehealth: Payer: Self-pay

## 2018-12-26 ENCOUNTER — Inpatient Hospital Stay (HOSPITAL_COMMUNITY)
Admission: AD | Admit: 2018-12-26 | Discharge: 2018-12-26 | Disposition: A | Discharge status: Home / Self Care | Payer: No Typology Code available for payment source | Attending: Obstetrics & Gynecology | Admitting: Obstetrics & Gynecology

## 2018-12-26 ENCOUNTER — Other Ambulatory Visit: Payer: Self-pay

## 2018-12-26 ENCOUNTER — Encounter (HOSPITAL_COMMUNITY): Payer: Self-pay

## 2018-12-26 DIAGNOSIS — R109 Unspecified abdominal pain: Secondary | ICD-10-CM | POA: Diagnosis not present

## 2018-12-26 DIAGNOSIS — Z87891 Personal history of nicotine dependence: Secondary | ICD-10-CM | POA: Insufficient documentation

## 2018-12-26 DIAGNOSIS — F329 Major depressive disorder, single episode, unspecified: Secondary | ICD-10-CM | POA: Diagnosis not present

## 2018-12-26 DIAGNOSIS — O200 Threatened abortion: Secondary | ICD-10-CM | POA: Diagnosis not present

## 2018-12-26 DIAGNOSIS — O99341 Other mental disorders complicating pregnancy, first trimester: Secondary | ICD-10-CM | POA: Insufficient documentation

## 2018-12-26 DIAGNOSIS — O26891 Other specified pregnancy related conditions, first trimester: Secondary | ICD-10-CM | POA: Diagnosis not present

## 2018-12-26 DIAGNOSIS — Z79899 Other long term (current) drug therapy: Secondary | ICD-10-CM | POA: Diagnosis not present

## 2018-12-26 DIAGNOSIS — Z3A01 Less than 8 weeks gestation of pregnancy: Secondary | ICD-10-CM | POA: Insufficient documentation

## 2018-12-26 DIAGNOSIS — O209 Hemorrhage in early pregnancy, unspecified: Secondary | ICD-10-CM

## 2018-12-26 DIAGNOSIS — O039 Complete or unspecified spontaneous abortion without complication: Secondary | ICD-10-CM | POA: Diagnosis present

## 2018-12-26 LAB — CBC
HCT: 39.4 % (ref 36.0–46.0)
Hemoglobin: 13.4 g/dL (ref 12.0–15.0)
MCH: 30.7 pg (ref 26.0–34.0)
MCHC: 34 g/dL (ref 30.0–36.0)
MCV: 90.2 fL (ref 80.0–100.0)
Platelets: 216 10*3/uL (ref 150–400)
RBC: 4.37 MIL/uL (ref 3.87–5.11)
RDW: 11.6 % (ref 11.5–15.5)
WBC: 6.9 10*3/uL (ref 4.0–10.5)
nRBC: 0 % (ref 0.0–0.2)

## 2018-12-26 LAB — URINALYSIS, ROUTINE W REFLEX MICROSCOPIC
Bilirubin Urine: NEGATIVE
Glucose, UA: NEGATIVE mg/dL
Ketones, ur: 5 mg/dL — AB
Leukocytes,Ua: NEGATIVE
Nitrite: NEGATIVE
Protein, ur: NEGATIVE mg/dL
Specific Gravity, Urine: 1.015 (ref 1.005–1.030)
pH: 5 (ref 5.0–8.0)

## 2018-12-26 LAB — HIV ANTIBODY (ROUTINE TESTING W REFLEX): HIV Screen 4th Generation wRfx: NONREACTIVE

## 2018-12-26 LAB — WET PREP, GENITAL
Clue Cells Wet Prep HPF POC: NONE SEEN
Sperm: NONE SEEN
Trich, Wet Prep: NONE SEEN
Yeast Wet Prep HPF POC: NONE SEEN

## 2018-12-26 LAB — HCG, QUANTITATIVE, PREGNANCY: hCG, Beta Chain, Quant, S: 455 m[IU]/mL — ABNORMAL HIGH (ref <5)

## 2018-12-26 NOTE — MAU Note (Signed)
Pt reports seeing a "pea sized clot" in the toilet this morning at 0730.  Sees pink blood when wiping. Is wearing panty liner, but only spotting. Starting cramping around 0830.

## 2018-12-26 NOTE — Telephone Encounter (Signed)
Returned call to patient regarding bleeding in pregnancy, patient was advised to go to the [facility].  She verbalized understanding

## 2018-12-26 NOTE — MAU Provider Note (Signed)
History     CSN: [id number]  Arrival date and time: 12/26/18 [id number]   First Provider Initiated Contact with Patient 12/26/18 1023      Chief Complaint  Patient presents with  . Vaginal Bleeding   HPI  Ms.  Erin Ruiz is a 28 y.o. year old [id number] female at 1176w0d weeks gestation who presents to MAU reporting she passed a "pea-sized clot" in the toilet this morning at about 0730. She had pink blood with wiping after that. She took a shower and is now wearing a pantiliner with spotting. She also reports abdominal pain/cramping since 0830. She reports that she last had SI on Sunday 12/22/2018. She reports recently having her wisdom teeth removed, taking Amoxicillin, ibuprofen, and hydrocodone. She is a GYN patient of [facility].  *Spouse Renae Fickle(Paul) present and contributing to history taking, exam, assessment, and discharge.   Past Medical History:  Diagnosis Date  . Depression   . Encounter for supervision of other normal pregnancy in first trimester 12/02/2014  . Heart murmur    childhood  . Preeclampsia 06/01/2015    Past Surgical History:  Procedure Laterality Date  . TONSILLECTOMY  2004  . WISDOM TOOTH EXTRACTION      Family History  Problem Relation Age of Onset  . Hypertension Mother   . Asthma Father     Social History   Tobacco Use  . Smoking status: Former Games developermoker  . Smokeless tobacco: Never Used  Substance Use Topics  . Alcohol use: No  . Drug use: No    Allergies: No Known Allergies  Medications Prior to Admission  Medication Sig Dispense Refill Last Dose  . escitalopram (LEXAPRO) 10 MG tablet Take 1 pill daily (10 mg) in addition to current 20 mg pill daily 30 tablet 3 12/25/2018 at Unknown time  . escitalopram (LEXAPRO) 20 MG tablet Take 1 tablet (20 mg total) by mouth daily. 90 tablet 3 12/25/2018 at Unknown time  . hydrOXYzine (ATARAX/VISTARIL) 25 MG tablet 1/2 tablet up to 3 times daily as needed for anxiety and 1 to 2 tablets at bedtime as needed for  sleep 45 tablet 1 More than a month at Unknown time    Review of Systems  Constitutional: Negative.   HENT: Negative.   Eyes: Negative.   Respiratory: Negative.   Cardiovascular: Negative.   Gastrointestinal: Negative.   Endocrine: Negative.   Genitourinary: Positive for pelvic pain and vaginal bleeding (spotting).  Musculoskeletal: Negative.   Skin: Negative.   Allergic/Immunologic: Negative.   Neurological: Negative.   Hematological: Negative.   Psychiatric/Behavioral: Negative.    Physical Exam   Blood pressure 112/72, pulse 73, resp. rate 16, height 5\' 4"  (1.626 m), weight 86.4 kg, last menstrual period 11/07/2018, SpO2 100 %.  Physical Exam  Nursing note and vitals reviewed. Constitutional: She is oriented to person, place, and time. She appears well-developed and well-nourished.  HENT:  Head: Normocephalic and atraumatic.  Eyes: Pupils are equal, round, and reactive to light.  Neck: Normal range of motion.  Cardiovascular: Normal rate and regular rhythm.  Respiratory: Effort normal.  GI: Soft.  Genitourinary:    Genitourinary Comments: Uterus: non-tender, SE: cervix is smooth, pink, no lesions, small amt of dark, red blood in vaginal vault -- WP, GC/CT done, closed/long/firm, no CMT or friability, no adnexal tenderness    Musculoskeletal: Normal range of motion.  Neurological: She is alert and oriented to person, place, and time. She has normal reflexes.  Skin: Skin is warm and dry.  Psychiatric: She  has a normal mood and affect. Her behavior is normal. Judgment and thought content normal.    MAU Course  Procedures  MDM CCUA UPT CBC ABO/Rh -- not drawn d/t known A POS HCG Wet Prep GC/CT -- pending HIV -- pending OB < 14 wks [place] with TV  Results for orders placed or performed during the hospital encounter of 12/26/18 (from the past 24 hour(s))  Urinalysis, Routine w reflex microscopic     Status: Abnormal   Collection Time: 12/26/18 10:09 AM  Result Value  Ref Range   Color, Urine YELLOW YELLOW   APPearance CLEAR CLEAR   Specific Gravity, Urine 1.015 1.005 - 1.030   pH 5.0 5.0 - 8.0   Glucose, UA NEGATIVE NEGATIVE mg/dL   Hgb urine dipstick MODERATE (A) NEGATIVE   Bilirubin Urine NEGATIVE NEGATIVE   Ketones, ur 5 (A) NEGATIVE mg/dL   Protein, ur NEGATIVE NEGATIVE mg/dL   Nitrite NEGATIVE NEGATIVE   Leukocytes,Ua NEGATIVE NEGATIVE   RBC / HPF 21-50 0 - 5 RBC/hpf   WBC, UA 0-5 0 - 5 WBC/hpf   Bacteria, UA RARE (A) NONE SEEN   Squamous Epithelial / LPF 0-5 0 - 5   Mucus PRESENT   Wet prep, genital     Status: Abnormal   Collection Time: 12/26/18 10:48 AM   Specimen: PATH Cytology Cervicovaginal Ancillary Only  Result Value Ref Range   Yeast Wet Prep HPF POC NONE SEEN NONE SEEN   Trich, Wet Prep NONE SEEN NONE SEEN   Clue Cells Wet Prep HPF POC NONE SEEN NONE SEEN   WBC, Wet Prep HPF POC MANY (A) NONE SEEN   Sperm NONE SEEN   CBC     Status: None   Collection Time: 12/26/18 10:57 AM  Result Value Ref Range   WBC 6.9 4.0 - 10.5 K/uL   RBC 4.37 3.87 - 5.11 MIL/uL   Hemoglobin 13.4 12.0 - 15.0 g/dL   HCT 72.5 36.6 - 44.0 %   MCV 90.2 80.0 - 100.0 fL   MCH 30.7 26.0 - 34.0 pg   MCHC 34.0 30.0 - 36.0 g/dL   RDW 34.7 42.5 - 95.6 %   Platelets 216 150 - 400 K/uL   nRBC 0.0 0.0 - 0.2 %  hCG, quantitative, pregnancy     Status: Abnormal   Collection Time: 12/26/18 10:57 AM  Result Value Ref Range   hCG, Beta Chain, Quant, S 455 (H) <5 mIU/mL  HIV Antibody (routine testing w rflx)     Status: None   Collection Time: 12/26/18 10:57 AM  Result Value Ref Range   HIV Screen 4th Generation wRfx NON REACTIVE NON REACTIVE    US Ob Less Than 14 Weeks With Ob Transvaginal  Result Date: 12/26/2018 CLINICAL DATA:  Bleeding EXAM: OBSTETRIC <14 WK [place] AND TRANSVAGINAL OB US TECHNIQUE: Both transabdominal and transvaginal ultrasound examinations were performed for complete evaluation of the gestation as well as the maternal uterus, adnexal  regions, and pelvic cul-de-sac. Transvaginal technique was performed to assess early pregnancy. COMPARISON:  None. FINDINGS: Intrauterine gestational sac: None Yolk sac:  Not visualized Embryo:  Not visualized Cardiac Activity: Not visualized Heart Rate:   bpm MSD:   mm    w     d CRL:    mm    w    d                  [place] EDC: Subchorionic hemorrhage:  None visualized. Maternal uterus/adnexae: No  adnexal mass. Trace free fluid in the pelvis. IMPRESSION: No intrauterine pregnancy visualized. Differential considerations would include early intrauterine pregnancy too early to visualize, spontaneous abortion, or occult ectopic pregnancy. Recommend close clinical followup and serial quantitative beta HCGs and ultrasounds. Electronically Signed   By: Rolm Baptise M.D.   On: 12/26/2018 12:28     Assessment and Plan  Vaginal bleeding in pregnancy, first trimester - Information provided on vaginal bleeding in pregnancy, HCG and threatened miscarriage - Return to MAU:  If you have heavier bleeding that soaks through more that 2 pads per hour for an hour or more  If you bleed so much that you feel like you might pass out or you do pass out  If you have significant abdominal pain that is not improved with Tylenol 1000 mg po  If you develop a fever > 100.5    Abdominal pain during pregnancy in first trimester  - Information provided on abdominal pain in pregnancy  - Advised to take Tylenol 100 mg every 6 hrs prn pain - Advised to d/c use of ibuprofen, unless directed by OB  - Discharge patient - Keep appt in MAU on 12/28/2018 for repeat HCG - Patient verbalized an understanding of the plan of care and agrees.   Laury Deep, MSN, CNM 12/26/2018, 10:23 AM

## 2018-12-26 NOTE — Discharge Instructions (Signed)

## 2018-12-28 ENCOUNTER — Inpatient Hospital Stay (HOSPITAL_COMMUNITY)
Admission: AD | Admit: 2018-12-28 | Discharge: 2018-12-28 | Disposition: A | Discharge status: Home / Self Care | Payer: No Typology Code available for payment source | Attending: Obstetrics and Gynecology | Admitting: Obstetrics and Gynecology

## 2018-12-28 ENCOUNTER — Other Ambulatory Visit: Payer: Self-pay

## 2018-12-28 DIAGNOSIS — O4690 Antepartum hemorrhage, unspecified, unspecified trimester: Secondary | ICD-10-CM | POA: Insufficient documentation

## 2018-12-28 DIAGNOSIS — O039 Complete or unspecified spontaneous abortion without complication: Secondary | ICD-10-CM | POA: Insufficient documentation

## 2018-12-28 DIAGNOSIS — Z3A01 Less than 8 weeks gestation of pregnancy: Secondary | ICD-10-CM | POA: Diagnosis not present

## 2018-12-28 DIAGNOSIS — O209 Hemorrhage in early pregnancy, unspecified: Secondary | ICD-10-CM

## 2018-12-28 LAB — HCG, QUANTITATIVE, PREGNANCY: hCG, Beta Chain, Quant, S: 111 m[IU]/mL — ABNORMAL HIGH (ref <5)

## 2018-12-28 NOTE — MAU Note (Signed)
Erin Ruiz is a 28 y.o. at [redacted]w[redacted]d here in MAU reporting: here for follow up hcg, states she is still bleeding. Bleeding is more like a period today, when she was here the other day she was just spotting. States bleeding was heavier yesterday. Still having some cramping  Pain score: 2/10  Vitals:   12/28/18 1002  BP: 122/73  Pulse: 82  Resp: 18  Temp: 98.3 F (36.8 C)  SpO2: 98%     Lab orders placed from triage: hcg

## 2018-12-28 NOTE — Discharge Instructions (Signed)
Miscarriage °A miscarriage is the loss of an unborn baby (fetus) before the 20th week of pregnancy. Most miscarriages happen during the first 3 months of pregnancy. Sometimes, a miscarriage can happen before a woman knows that she is pregnant. °Having a miscarriage can be an emotional experience. If you have had a miscarriage, talk with your health care provider about any questions you may have about miscarrying, the grieving process, and your plans for future pregnancy. °What are the causes? °A miscarriage may be caused by: °· Problems with the genes or chromosomes of the fetus. These problems make it impossible for the baby to develop normally. They are often the result of random errors that occur early in the development of the baby, and are not passed from parent to child (not inherited). °· Infection of the cervix or uterus. °· Conditions that affect hormone balance in the body. °· Problems with the cervix, such as the cervix opening and thinning before pregnancy is at term (cervical insufficiency). °· Problems with the uterus. These may include: °? A uterus with an abnormal shape. °? Fibroids in the uterus. °? Congenital abnormalities. These are problems that were present at birth. °· Certain medical conditions. °· Smoking, drinking alcohol, or using drugs. °· Injury (trauma). °In many cases, the cause of a miscarriage is not known. °What are the signs or symptoms? °Symptoms of this condition include: °· Vaginal bleeding or spotting, with or without cramps or pain. °· Pain or cramping in the abdomen or lower back. °· Passing fluid, tissue, or blood clots from the vagina. °How is this diagnosed? °This condition may be diagnosed based on: °· A physical exam. °· Ultrasound. °· Blood tests. °· Urine tests. °How is this treated? °Treatment for a miscarriage is sometimes not necessary if you naturally pass all the tissue that was in your uterus. If necessary, this condition may be treated with: °· Dilation and  curettage (D&C). This is a procedure in which the cervix is stretched open and the lining of the uterus (endometrium) is scraped. This is done only if tissue from the fetus or placenta remains in the body (incomplete miscarriage). °· Medicines, such as: °? Antibiotic medicine, to treat infection. °? Medicine to help the body pass any remaining tissue. °? Medicine to reduce (contract) the size of the uterus. These medicines may be given if you have a lot of bleeding. °If you have Rh negative blood and your baby was Rh positive, you will need a shot of a medicine called Rh immunoglobulinto protect your future babies from Rh blood problems. "Rh-negative" and "Rh-positive" refer to whether or not the blood has a specific protein found on the surface of red blood cells (Rh factor). °Follow these instructions at home: °Medicines ° °· Take over-the-counter and prescription medicines only as told by your health care provider. °· If you were prescribed antibiotic medicine, take it as told by your health care provider. Do not stop taking the antibiotic even if you start to feel better. °· Do not take NSAIDs, such as aspirin and ibuprofen, unless they are approved by your health care provider. These medicines can cause bleeding. °Activity °· Rest as directed. Ask your health care provider what activities are safe for you. °· Have someone help with home and family responsibilities during this time. °General instructions °· Keep track of the number of sanitary pads you use each day and how soaked (saturated) they are. Write down this information. °· Monitor the amount of tissue or blood clots that   you pass from your vagina. Save any large amounts of tissue for your health care provider to examine. °· Do not use tampons, douche, or have sex until your health care provider approves. °· To help you and your partner with the process of grieving, talk with your health care provider or seek counseling. °· When you are ready, meet with  your health care provider to discuss any important steps you should take for your health. Also, discuss steps you should take to have a healthy pregnancy in the future. °· Keep all follow-up visits as told by your health care provider. This is important. °Where to find more information °· The American Congress of Obstetricians and Gynecologists: www.acog.org °· U.S. Department of Health and Human Services Office of Women’s Health: www.womenshealth.gov °Contact a health care provider if: °· You have a fever or chills. °· You have a foul smelling vaginal discharge. °· You have more bleeding instead of less. °Get help right away if: °· You have severe cramps or pain in your back or abdomen. °· You pass blood clots or tissue from your vagina that is walnut-sized or larger. °· You soak more than 1 regular sanitary pad in an hour. °· You become light-headed or weak. °· You pass out. °· You have feelings of sadness that take over your thoughts, or you have thoughts of hurting yourself. °Summary °· Most miscarriages happen in the first 3 months of pregnancy. Sometimes miscarriage happens before a woman even knows that she is pregnant. °· Follow your health care provider's instruction for home care. Keep all follow-up appointments. °· To help you and your partner with the process of grieving, talk with your health care provider or seek counseling. °This information is not intended to replace advice given to you by your health care provider. Make sure you discuss any questions you have with your health care provider. °Document Released: 08/15/2000 Document Revised: 06/13/2018 Document Reviewed: 03/27/2016 °Elsevier Patient Education © 2020 Elsevier Inc. ° ° °Managing Pregnancy Loss °Pregnancy loss can happen any time during a pregnancy. Often the cause is not known. It is rarely because of anything you did. Pregnancy loss in early pregnancy (during the first trimester) is called a miscarriage. This type of pregnancy loss is  the most common. Pregnancy loss that happens after 20 weeks of pregnancy is called fetal demise if the baby's heart stops beating before birth. Fetal demise is much less common. Some women experience spontaneous labor shortly after fetal demise resulting in a stillborn birth (stillbirth). °Any pregnancy loss can be devastating. You will need to recover both physically and emotionally. Most women are able to get pregnant again after a pregnancy loss and deliver a healthy baby. °How to manage emotional recovery ° °Pregnancy loss is very hard emotionally. You may feel many different emotions while you grieve. You may feel sad and angry. You may also feel guilty. It is normal to have periods of crying. Emotional recovery can take longer than physical recovery. It is different for everyone. °Taking these steps can help you in managing this loss: °· Remember that it is unlikely you did anything to cause the pregnancy loss. °· Share your thoughts and feelings with friends, family, and your partner. Remember that your partner is also recovering emotionally. °· Make sure you have a good support system. Do not spend too much time alone. °· Meet with a pregnancy loss counselor or join a pregnancy loss support group. °· Get enough sleep and eat a healthy diet. Return   to regular exercise when you have recovered physically. °· Do not use drugs or alcohol to manage your emotions. °· Consider seeing a mental health professional to help you recover emotionally. °· Ask a friend or loved one to help you decide what to do with any clothing and nursery items you received for your baby. °In the case of a stillbirth, many women benefit from taking additional steps in the grieving process. You may want to: °· Hold your baby after the birth. °· Name your baby. °· Request a birth certificate. °· Create a keepsake such as handprints or footprints. °· Dress your baby and have a picture taken. °· Make funeral arrangements. °· Ask for a baptism  or blessing. °Hospitals have staff members who can help you with all these arrangements. °How to recognize emotional stress °It is normal to have emotional stress after a pregnancy loss. But emotional stress that lasts a long time or becomes severe requires treatment. Watch out for these signs of severe emotional stress: °· Sadness, anger, or guilt that is not going away and is interfering with your normal activities. °· Relationship problems that have occurred or gotten worse since the pregnancy loss. °· Signs of depression that last longer than 2 weeks. These may include: °? Sadness. °? Anxiety. °? Hopelessness. °? Loss of interest in activities you enjoy. °? Inability to concentrate. °? Trouble sleeping or sleeping too much. °? Loss of appetite or overeating. °? Thoughts of death or of hurting yourself. °Follow these instructions at home: °· Take over-the-counter and prescription medicines only as told by your health care provider. °· Rest at home until your energy level returns. Return to your normal activities as told by your health care provider. Ask your health care provider what activities are safe for you. °· When you are ready, meet with your health care provider to discuss steps to take for a future pregnancy. °· Keep all follow-up visits as told by your health care provider. This is important. °Where to find support °· To help you and your partner with the process of grieving, talk with your health care provider or seek counseling. °· Consider meeting with others who have experienced pregnancy loss. Ask your health care provider about support groups and resources. °Where to find more information °· U.S. Department of Health and Human Services Office on Women's Health: www.womenshealth.gov °· American Pregnancy Association: www.americanpregnancy.org °Contact a health care provider if: °· You continue to experience grief, sadness, or lack of motivation for everyday activities, and those feelings do not  improve over time. °· You are struggling to recover emotionally, especially if you are using alcohol or substances to help. °Get help right away if: °· You have thoughts of hurting yourself or others. °If you ever feel like you may hurt yourself or others, or have thoughts about taking your own life, get help right away. You can go to your nearest emergency department or call: °· Your local emergency services (911 in the [place] °· A suicide crisis helpline, such as the National Suicide Prevention Lifeline at 1-800-273-8255. This is open 24 hours a day. °Summary °· Any pregnancy loss can be difficult physically and emotionally. °· You may experience many different emotions while you grieve. Emotional recovery can last longer than physical recovery. °· It is normal to have emotional stress after a pregnancy loss. But emotional stress that lasts a long time or becomes severe requires treatment. °· See your health care provider if you are struggling emotionally after a pregnancy   loss. °This information is not intended to replace advice given to you by your health care provider. Make sure you discuss any questions you have with your health care provider. °Document Released: 05/02/2017 Document Revised: 06/11/2018 Document Reviewed: 05/02/2017 °Elsevier Patient Education © 2020 Elsevier Inc. ° °

## 2018-12-28 NOTE — MAU Provider Note (Signed)
Ms. Tanajah Boulter  is a 28 y.o. [phone]  at [redacted]w[redacted]d who presents to MAU today for follow-up quant hCG. Continues to have some period like bleeding. Denies abdominal pain.   BP 122/73 (BP Location: Right Arm)   Pulse 82   Temp 98.3 F (36.8 C) (Oral)   Resp 18   LMP 11/07/2018   SpO2 98%   GENERAL: Well-developed, well-nourished female in no acute distress.  HEENT: Normocephalic, atraumatic.   LUNGS: Effort normal HEART: Regular rate  SKIN: Warm, dry and without erythema PSYCH: Normal mood and affect   A: 1. Miscarriage   2. Vaginal bleeding in pregnancy, first trimester    Component     Latest Ref Rng & Units 12/26/2018 12/28/2018  HCG, Beta Chain, Quant, S     <5 mIU/mL 455 (H) 111 (H)   >50% drop in HCG RH positive   P: Discharge home Bleeding precautions Msg to [facility] for miscarriage follow up  Jorje Guild, NP  12/28/2018 10:14 AM

## 2018-12-30 LAB — GC/CHLAMYDIA PROBE AMP (~~LOC~~) NOT AT ARMC
Chlamydia: NEGATIVE
Comment: NEGATIVE
Comment: NORMAL
Neisseria Gonorrhea: NEGATIVE

## 2019-01-02 ENCOUNTER — Telehealth: Payer: Self-pay

## 2019-01-02 NOTE — Telephone Encounter (Signed)
Called patient to do their pre-visit COVID screening.  Call went to voicemail which was full. Unable to do prescreening.  

## 2019-01-05 ENCOUNTER — Ambulatory Visit: Payer: No Typology Code available for payment source

## 2019-01-09 ENCOUNTER — Other Ambulatory Visit: Payer: No Typology Code available for payment source

## 2019-01-09 ENCOUNTER — Other Ambulatory Visit: Payer: Self-pay

## 2019-01-09 DIAGNOSIS — O209 Hemorrhage in early pregnancy, unspecified: Secondary | ICD-10-CM

## 2019-01-09 DIAGNOSIS — O039 Complete or unspecified spontaneous abortion without complication: Secondary | ICD-10-CM

## 2019-01-10 LAB — BETA HCG QUANT (REF LAB): hCG Quant: 1 m[IU]/mL

## 2019-01-15 ENCOUNTER — Ambulatory Visit: Payer: No Typology Code available for payment source | Admitting: Certified Nurse Midwife

## 2019-01-22 ENCOUNTER — Encounter: Payer: No Typology Code available for payment source | Admitting: Women's Health

## 2019-01-26 ENCOUNTER — Encounter: Payer: Self-pay | Admitting: Advanced Practice Midwife

## 2019-01-26 ENCOUNTER — Telehealth: Payer: Self-pay

## 2019-01-26 ENCOUNTER — Other Ambulatory Visit: Payer: Self-pay | Admitting: Advanced Practice Midwife

## 2019-01-26 ENCOUNTER — Ambulatory Visit (INDEPENDENT_AMBULATORY_CARE_PROVIDER_SITE_OTHER): Payer: No Typology Code available for payment source | Admitting: Advanced Practice Midwife

## 2019-01-26 ENCOUNTER — Other Ambulatory Visit: Payer: Self-pay

## 2019-01-26 VITALS — BP 139/84 | HR 82 | Wt 199.0 lb

## 2019-01-26 DIAGNOSIS — F329 Major depressive disorder, single episode, unspecified: Secondary | ICD-10-CM

## 2019-01-26 DIAGNOSIS — R635 Abnormal weight gain: Secondary | ICD-10-CM | POA: Diagnosis not present

## 2019-01-26 DIAGNOSIS — O039 Complete or unspecified spontaneous abortion without complication: Secondary | ICD-10-CM

## 2019-01-26 NOTE — Patient Instructions (Signed)
Psychiatric Services °[facility]  °[address], [place], [place] °[phone] ° °[facility] °[address], [place], [place] °Helpline 336-832-9700 or 1-800-711-2635 °[email or web address] ° °[facility] °Walk-in Mon-Fri, 8:30-5:00 °[address], [place], [place] °336-676-6840 °www.[email or web address].org  °*Bring your own interpreter at 1st visit ° °[facility] °[address], [place], [place] °336-505-9494 °www.[email or web address].com  ° °[facility]  °[address], [place], [place] °[phone] ° °[facility] °Walk-in Mon-Fri, 8am-3pm °[address], [place], [place] °336-899-1505 °www.[facility].org ° °Therapy/Counseling ° °[facility] °[address], [place], [place] °[phone] ° °[facility] °[address], [place], [place] °[phone] ° °[facility] °[address], [place], [place] °[phone] °*pacientes que hablen espanol, favor comunicarse con el Sr. [place], extension 2244 o la [address], extension 3331 para hacer una cita.  ° °[facility] °[address] [place]” °[phone] (Habla Espanol) ° °[facility] °[address], [place], [place] °336-542-2076 ° °[facility] °[address], [place], [place] °[phone] °  °[name]  °[address], [place], [place] °336-429-5600 °www.[email or web address].org  °*Uninsured and underinsured, ages 19-64 ° °[facility] °[address], [place], [place] °[phone] (Habla Espanol) ° °[facility] °[address], [place], [place] °336-285-7173 (Habla Espanol) ° °[facility] °[address], [place], [place] °[phone] (Habla  Espanol) ° °[facility] °Mon-Thurs 8:30am-8:00pm/ Fri 8:30-7:00pm °[address], [place], [place] °(3rd floor, located at corner of [address]) °Call [phone] to schedule an appointment °[email or web address] ° °[facility] °[address], [place], [place]  °336-542-2884 ° °[facility]  °[address], [place], [place] °336-375-8333 ° °Social Support °[facility] °(336) 373-1402 or www.mhag.org °[address], [place], [place] [address] °* Recovery support and educational programs, including recovery skills classes, support groups, and one-on-one sessions with  Certified Peer Support Specialists.  ° ° °[facility] ([employer] [facility] of the [facility]ally Ill) [place] °NAMI Helpline: (336) 370-4264 °* Family and Friends Support Group/  Contact Jack Glenn at 336-638-9276 for more information °* Family to Family Education Program and Basics Class : enroll online or email Mary Pennington at [email or web address]  °* Monthly educational meetings, contact Mitch McGee at 336-681-2629 °[email or web address]  ° ° °24- Hour Availability:  °*[facility] 336-832-9700 or 1-800-711-2635 ° °* [facility] Crisis (Domestic Violence, Rape, Victim Assistance) Line 336-273-7273 ° °* [facility] 1-855-788-8787 or 336-676-6840 ° °* [facility] [place] [facility]  °336-899-1505(8am-4pm only) °1-866-261-5769(after hours) ° °*[facility] °1-877-626-1772 ° °*[place] National Suicide Hotline °1-800-273-8255 ° ° °

## 2019-01-26 NOTE — Telephone Encounter (Signed)
Patient was referred by [facility] for depression/anxiety(September), you have left voicemail for her. Recently had a miscarriage, has had an increase in depression & anxiety.  Patient has returned back to work so she states that appointments first thing in the morning or last appointment in the afternoon work best for her.  I did schedule a tentative appointment with you on 01/28/2019 @ 4 PM but if you could reach out to her prior to see if that time even works for her.

## 2019-01-26 NOTE — Progress Notes (Signed)
  GYNECOLOGY PROGRESS NOTE  History:  [name] presents to [facility] office today for problem gyn visit. She is s/p SAB, with MAU visit on 12/26/18 with onset of bleeding and hcg drop from 455 down to 1 on 01/09/19. Menses have not resumed.  She reports feeling like she did something to cause the miscarriage. She didn't know she was pregnant and had dental surgery and was on ibuprofen. She reports weight gain in the last few months.  Since the miscarriage she has been emotional and also feeling hot and flushed at times for no reason.  She desires another pregnancy but is scared because she had preeclampsia in her last pregnancy and a miscarriage this time so is not sure if it is safe for her to be pregnant.  She denies h/a, dizziness, shortness of breath, n/v, or fever/chills.    The following portions of the patient's history were reviewed and updated as appropriate: allergies, current medications, past family history, past medical history, past social history, past surgical history and problem list. Last pap smear on 03/2017 was normal.  Review of Systems:  Pertinent items are noted in HPI.   Objective:  Physical Exam Blood pressure 139/84, pulse 82, weight 199 lb (90.3 kg), last menstrual period 11/07/2018, unknown if currently breastfeeding. VS reviewed, nursing note reviewed,  Constitutional: well developed, well nourished, no distress HEENT: normocephalic CV: normal rate Pulm/chest wall: normal effort Breast Exam: deferred Abdomen: soft Neuro: alert and oriented x 3 Skin: warm, dry Psych: affect normal Pelvic exam: Cervix pink, visually closed, without lesion, scant white creamy discharge, vaginal walls and external genitalia normal Bimanual exam: Cervix 0/long/high, firm, anterior, neg CMT, uterus nontender, nonenlarged, adnexa without tenderness, enlargement, or mass  Assessment & Plan:  1. SAB (spontaneous abortion) --Pt with hcg down to 1 but reports some pregnancy symptoms  along with concerns that period has not resumed.   --Most recent miscarriage is pt only miscarriage, likely a random occurrence. Pt with preeclampsia in one prior pregnancy with delivery without complication at 37 weeks.  Admitted in labor, with augmentation after admission.  No medical contraindication to another pregnancy. Pt may choose to wait and process recent loss, but safe to plan another pregnancy when desired. - CBC - hCG, quantitative, pregnancy - Ambulatory referral to [facility]  2. Recent weight gain --Recent symptoms like feeling hot/flushed and weight gain. May be related to pregnancy hormones from early pregnancy loss but still symptomatic and hcg down to 1.   - TSH  3. Reactive depression (situational) --Pt has good support and speaks positively about her relationship with her 2 daughters, ages 32 and 15, and her son, who is 3.   --She feels guilt and thinks she caused this miscarriage.  She is tearful in today's appointment.   --She denies any thoughts of harming herself or others. --She is taking Lexapro 30 mg as prescribed by her PCP and dose recently increased 2 months ago. She does not have regular [facility] provider. --Discussed what to do/where to go if in crisis including [facility], 24 hour hotline, closest emergency room. Pt given resources for behavioral health in the area. - Ambulatory referral to [facility], CNM 4:Erin PM

## 2019-01-27 ENCOUNTER — Ambulatory Visit (INDEPENDENT_AMBULATORY_CARE_PROVIDER_SITE_OTHER): Payer: Medicaid Other | Admitting: Licensed Clinical Social Worker

## 2019-01-27 DIAGNOSIS — F339 Major depressive disorder, recurrent, unspecified: Secondary | ICD-10-CM

## 2019-01-27 DIAGNOSIS — F439 Reaction to severe stress, unspecified: Secondary | ICD-10-CM | POA: Diagnosis not present

## 2019-01-27 LAB — TSH: TSH: 1.42 u[IU]/mL (ref 0.450–4.500)

## 2019-01-27 LAB — CBC
Hematocrit: 38.9 % (ref 34.0–46.6)
Hemoglobin: 13.3 g/dL (ref 11.1–15.9)
MCH: 30.4 pg (ref 26.6–33.0)
MCHC: 34.2 g/dL (ref 31.5–35.7)
MCV: 89 fL (ref 79–97)
Platelets: 225 10*3/uL (ref 150–450)
RBC: 4.38 x10E6/uL (ref 3.77–5.28)
RDW: 11.6 % — ABNORMAL LOW (ref 11.7–15.4)
WBC: 8.1 10*3/uL (ref 3.4–10.8)

## 2019-01-27 LAB — BETA HCG QUANT (REF LAB): hCG Quant: <1 m[IU]/mL

## 2019-01-28 ENCOUNTER — Ambulatory Visit: Payer: No Typology Code available for payment source | Admitting: Licensed Clinical Social Worker

## 2019-02-06 ENCOUNTER — Telehealth: Payer: Self-pay | Admitting: Licensed Clinical Social Worker

## 2019-02-06 NOTE — Telephone Encounter (Signed)
A completed referral to [facility] faxed.

## 2019-02-06 NOTE — BH Specialist Note (Signed)
[facility] Visit via Telemedicine (Telephone)  01/27/2019 Erin Ruiz [id number]   Session Start time: 2:30 PM  Session End time: 3:00 PM Total time: 30  Referring Provider: Dr. Chapman Fitch Type of Visit: Telephonic Patient location: [facility] Provider location: Office All persons participating in visit: LCSW and Patient  Confirmed patient's address: Yes  Confirmed patient's phone number: Yes  Any changes to demographics: No   Confirmed patient's insurance: Yes  Any changes to patient's insurance: No   Discussed confidentiality: Yes    The following statements were read to the patient and/or legal guardian that are established with the [facility] Provider.  "The purpose of this phone visit is to provide behavioral health care while limiting exposure to the coronavirus (COVID19).  There is a possibility of technology failure and discussed alternative modes of communication if that failure occurs."  "By engaging in this telephone visit, you consent to the provision of healthcare.  Additionally, you authorize for your insurance to be billed for the services provided during this telephone visit."   Patient and/or legal guardian consented to telephone visit: Yes   PRESENTING CONCERNS: Patient and/or family reports the following symptoms/concerns: Pt reports difficulty managing mental health symptoms triggered in strain in marriage and recent miscarriage. Symptoms include feelings of sadness, decrease in concentration, low motivation, worrying, over-eating resulting in 30 lb gain since September, and an increase in irritaiblity  Duration of problem: Ongoing, Pt reports postpartum after birth of daughter 5 yrs ago; Severity of problem: severe  STRENGTHS (Protective Factors/Coping Skills): Pt is interested in initiating behavioral health services Pt identified healthy coping skills (working, medication management, games on phone, and spending time  outside)  GOALS ADDRESSED: Patient will: 1.  Reduce symptoms of: agitation, anxiety, depression and stress  2.  Increase knowledge and/or ability of: coping skills and healthy habits  3.  Demonstrate ability to: Increase healthy adjustment to current life circumstances and Increase adequate support systems for patient/family  INTERVENTIONS: Interventions utilized:  Solution-Focused Strategies, Supportive Counseling, Psychoeducation and/or Health Education and Link to [employer] Standardized Assessments completed: Not Needed  ASSESSMENT: Patient currently experiencing depression and anxiety triggered by psychosocial stressors.    Patient may benefit from psychotherapy and medication management. LCSW provided support and validation. Pt provided consent for LCSW to complete referral to [facility]. Community resources were provided to assist with food insecurity noting pt's fridge isn't working properly.   PLAN: 1. Follow up with behavioral health clinician on : Contact LCSW with any additional behavioral health and/or resource needs 2. Behavioral recommendations: Continue to utilize healthy coping skills and follow up with [facility] referral for behavioral health 3. Referral(s): Armed forces logistics/support/administrative officer (LME/Outside Clinic) and Community Resources:  Food  Erin Ruiz, [place] 02/06/2019 11:05 AM

## 2019-02-09 ENCOUNTER — Other Ambulatory Visit: Payer: Self-pay

## 2019-02-09 ENCOUNTER — Ambulatory Visit (INDEPENDENT_AMBULATORY_CARE_PROVIDER_SITE_OTHER): Payer: Medicaid Other | Admitting: Clinical

## 2019-02-09 DIAGNOSIS — F4323 Adjustment disorder with mixed anxiety and depressed mood: Secondary | ICD-10-CM

## 2019-02-09 NOTE — BH Specialist Note (Signed)
Integrated Behavioral Health via Telemedicine Video Visit  02/09/2019 Erin Ruiz [id number]  Number of [place] visits: 1 Session Start time: 3:15  Session End time: 4:10 Total time: 59   Referring Provider: Fatima Blank, CNM  Type of Visit: Video Patient/Family location: Home [name] Provider location: [facility] All persons participating in visit: Patient Erin Ruiz and [place]    Confirmed patient's address: Yes  Confirmed patient's phone number: Yes  Any changes to demographics: No   Confirmed patient's insurance: Yes  Any changes to patient's insurance: No   Discussed confidentiality: Yes   I connected with Windi Andradeby a video enabled telemedicine application and verified that I am speaking with the correct person using two identifiers.     I discussed the limitations of evaluation and management by telemedicine and the availability of in person appointments.  I discussed that the purpose of this visit is to provide behavioral health care while limiting exposure to the novel coronavirus.   Discussed there is a possibility of technology failure and discussed alternative modes of communication if that failure occurs.  I discussed that engaging in this video visit, they consent to the provision of behavioral healthcare and the services will be billed under their insurance.  Patient and/or legal guardian expressed understanding and consented to video visit: Yes   PRESENTING CONCERNS: Patient and/or family reports the following symptoms/concerns: Pt states her primary concern today is an increase in symptoms of depression and anxiety, attributed to numerous life stressors (in past year: mother moved to [place] in recovery, reconcile with husband, loss of family friend, miscarriage, in the midst of covid and virtual schooling children). Pt was on previous antidepressant that was not working, so switched to Lexapro, and does not feel it is  working; plans to discuss [employer] med changes with PCP this month, and will accept referral to psychiatry.  Duration of problem: Increase at least 5 years ago ; Severity of problem: moderately severe  STRENGTHS (Protective Factors/Coping Skills): Coping skills (music, games on phone, quiet moments to self)  GOALS ADDRESSED: Patient will: 1.  Reduce symptoms of: anxiety, depression and stress  2.  Increase knowledge and/or ability of: self-management skills and stress reduction  3.  Demonstrate ability to: Increase healthy adjustment to current life circumstances  INTERVENTIONS: Interventions utilized:  Supportive Counseling and Medication Monitoring Standardized Assessments completed: Not Needed  ASSESSMENT: Patient currently experiencing Adjustment disorder with mixed depression and anxiety.   Patient may benefit from psychoeducation and brief therapeutic interventions regarding coping with symptoms of depression and anxiety .  PLAN: 1. Follow up with behavioral health clinician on : Three weeks 2. Behavioral recommendations:  -Continue taking BH medication as prescribed -Continue using daily self-coping strategies  -Talk to PCP about BH medication changes at upcoming appointment -Accept referral to psychiatry 3. Referral(s): [facility] (In Clinic)  I discussed the assessment and treatment plan with the patient and/or parent/guardian. They were provided an opportunity to ask questions and all were answered. They agreed with the plan and demonstrated an understanding of the instructions.   They were advised to call back or seek an in-person evaluation if the symptoms worsen or if the condition fails to improve as anticipated.  Caroleen Hamman [facility]  Depression screen [name] 2/9 10/20/2018 05/20/2018 11/29/2015 08/23/2014 03/03/2014  Decreased Interest 3 0 (No Data) 0 0  Down, Depressed, Hopeless 3 1 0 0 0  PHQ - 2 Score 6 1 0 0 0  Altered sleeping 3 0 - - -  Tired,  decreased energy 2 1 - - -  Change in appetite 2 0 - - -  Feeling bad or failure about yourself  2 1 - - -  Trouble concentrating 3 0 - - -  Moving slowly or fidgety/restless 1 0 - - -  Suicidal thoughts 0 0 - - -  PHQ-9 Score 19 3 - - -  Difficult doing work/chores Very difficult - - - -   GAD 7 : Generalized Anxiety Score 10/20/2018 05/20/2018 11/29/2015  Nervous, Anxious, on Edge 3 1 3   Control/stop worrying 3 1 0  Worry too much - different things 3 1 3   Trouble relaxing 3 1 3   Restless 1 1 3   Easily annoyed or irritable 2 1 3   Afraid - awful might happen 0 0 0  Total GAD 7 Score 15 6 15

## 2019-02-12 ENCOUNTER — Telehealth: Payer: Self-pay | Admitting: Licensed Clinical Social Worker

## 2019-02-12 NOTE — Telephone Encounter (Signed)
Follow up call placed to patient. LCSW left message requesting return call.  

## 2019-02-13 ENCOUNTER — Telehealth: Payer: Self-pay

## 2019-02-13 NOTE — Telephone Encounter (Signed)
Called patient to do their pre-visit COVID screening.  Call went to voicemail. Unable to do prescreening.  

## 2019-02-16 ENCOUNTER — Other Ambulatory Visit: Payer: Self-pay

## 2019-02-16 ENCOUNTER — Ambulatory Visit (INDEPENDENT_AMBULATORY_CARE_PROVIDER_SITE_OTHER): Payer: Medicaid Other | Admitting: Family Medicine

## 2019-02-16 VITALS — BP 118/75 | HR 80 | Temp 97.3°F | Resp 17 | Wt 212.6 lb

## 2019-02-16 DIAGNOSIS — F32A Depression, unspecified: Secondary | ICD-10-CM

## 2019-02-16 DIAGNOSIS — F329 Major depressive disorder, single episode, unspecified: Secondary | ICD-10-CM

## 2019-02-16 DIAGNOSIS — R002 Palpitations: Secondary | ICD-10-CM

## 2019-02-16 DIAGNOSIS — E559 Vitamin D deficiency, unspecified: Secondary | ICD-10-CM | POA: Diagnosis not present

## 2019-02-16 DIAGNOSIS — F419 Anxiety disorder, unspecified: Secondary | ICD-10-CM | POA: Diagnosis not present

## 2019-02-16 MED ORDER — HYDROXYZINE HCL 10 MG PO TABS: 10.0000 mg | ORAL_TABLET | Freq: Three times a day (TID) | ORAL | 1 refills | Status: DC | PRN

## 2019-02-16 NOTE — Progress Notes (Signed)
Established Patient Office Visit  Subjective:  Patient ID: Erin Ruiz, female    DOB: September 17, 1990  Age: 28 y.o. MRN: [id number]  CC:  Chief Complaint  Patient presents with  . Depression  . Anxiety    HPI Erin Ruiz, 28 year old female, who presents in follow-up of anxiety and depression.  She reports that initially she felt that the Lexapro was helping with her anxiety and depression but she now feels that she continues to have issues with anxiety.  She has noticed episodes of mid upper chest fluttering and occasional sensation of increased heart rate accompanied by slight shortness of breath.  Episodes occur randomly with and without activity.  No associated symptoms such as nausea or profuse sweating.  She did recently see her GYN and she had a TSH done which was normal.  She has followed up with social worker at both this office and GYN and patient would like to be referred to psychiatry for further evaluation and treatment of her anxiety and depression.      She believes that some of her anxiety is from current COVID-19 pandemic as she is having to homeschool her children, 2 of which are school-age and she additionally has a toddler.  She has concerns about finances as well as concerns about keeping her family healthy.  She also feels as if she often tends to just worry in general.  She has gotten better if falling asleep but still often awakens during the night and has difficulty falling asleep again.  She also continues to have issues with fatigue.  She also feels as if she has had difficulty losing weight after her last pregnancy and due to decreased activity because of the COVID-19 pandemic and increased eating due to stress, she feels that she continues to gain weight which also contributes to her anxiety.  Past Medical History:  Diagnosis Date  . Depression   . Encounter for supervision of other normal pregnancy in first trimester 12/02/2014  . Heart murmur    childhood  .  Preeclampsia 06/01/2015    Past Surgical History:  Procedure Laterality Date  . TONSILLECTOMY  2004  . WISDOM TOOTH EXTRACTION      Family History  Problem Relation Age of Onset  . Hypertension Mother   . Asthma Father     Social History   Socioeconomic History  . Marital status: Married    Spouse name: Not on file  . Number of children: Not on file  . Years of education: Not on file  . Highest education level: Not on file  Occupational History  . Not on file  Tobacco Use  . Smoking status: Former Games developermoker  . Smokeless tobacco: Never Used  Substance and Sexual Activity  . Alcohol use: No  . Drug use: No  . Sexual activity: Yes    Partners: Male    Birth control/protection: None  Other Topics Concern  . Not on file  Social History Narrative  . Not on file   Social Determinants of Health   Financial Resource Strain:   . Difficulty of Paying Living Expenses: Not on file  Food Insecurity:   . Worried About Programme researcher, broadcasting/film/videounning Out of Food in the Last Year: Not on file  . Ran Out of Food in the Last Year: Not on file  Transportation Needs:   . Lack of Transportation (Medical): Not on file  . Lack of Transportation (Non-Medical): Not on file  Physical Activity:   . Days of Exercise per Week:  Not on file  . Minutes of Exercise per Session: Not on file  Stress:   . Feeling of Stress : Not on file  Social Connections:   . Frequency of Communication with Friends and Family: Not on file  . Frequency of Social Gatherings with Friends and Family: Not on file  . Attends Religious Services: Not on file  . Active Member of Clubs or Organizations: Not on file  . Attends Banker Meetings: Not on file  . Marital Status: Not on file  Intimate Partner Violence:   . Fear of Current or Ex-Partner: Not on file  . Emotionally Abused: Not on file  . Physically Abused: Not on file  . Sexually Abused: Not on file    Outpatient Medications Prior to Visit  Medication Sig Dispense  Refill  . BIOTIN MAXIMUM PO Take 1 tablet by mouth daily.    [name] escitalopram (LEXAPRO) 10 MG tablet Take 1 pill daily (10 mg) in addition to current 20 mg pill daily 30 tablet 3  . escitalopram (LEXAPRO) 20 MG tablet Take 1 tablet (20 mg total) by mouth daily. 90 tablet 3  . hydrOXYzine (ATARAX/VISTARIL) 25 MG tablet 1/2 tablet up to 3 times daily as needed for anxiety and 1 to 2 tablets at bedtime as needed for sleep (Patient not taking: Reported on 01/26/2019) 45 tablet 1   No facility-administered medications prior to visit.    No Known Allergies  ROS Review of Systems  Constitutional: Positive for fatigue. Negative for chills and fever.  HENT: Negative for sore throat and trouble swallowing.   Eyes: Negative for photophobia and visual disturbance.  Respiratory: Positive for shortness of breath (Occasionally during sensation of increased heart rate). Negative for cough.   Cardiovascular: Positive for palpitations. Negative for chest pain and leg swelling.  Gastrointestinal: Negative for abdominal pain, blood in stool, constipation, diarrhea and nausea.  Endocrine: Negative for polydipsia, polyphagia and polyuria.  Genitourinary: Negative for dysuria and frequency.  Musculoskeletal: Negative for arthralgias and back pain.  Neurological: Negative for dizziness and headaches.  Hematological: Negative for adenopathy. Does not bruise/bleed easily.  Psychiatric/Behavioral: Positive for sleep disturbance. Negative for self-injury and suicidal ideas. The patient is nervous/anxious.       Objective:    Physical Exam  Constitutional: She is oriented to person, place, and time. She appears well-developed and well-nourished.  Well-nourished well-developed overweight for height female in no acute distress.  Patient is wearing face mask as per office COVID-19 protocol  Neck: No thyromegaly present.  Cardiovascular: Normal rate and regular rhythm.  Pulmonary/Chest: Effort normal and breath  sounds normal.  Abdominal: Soft. There is no abdominal tenderness. There is no rebound and no guarding.  Musculoskeletal:        General: No tenderness or edema.     Cervical back: Normal range of motion and neck supple.  Lymphadenopathy:    She has no cervical adenopathy.  Neurological: She is alert and oriented to person, place, and time.  Skin: Skin is warm and dry.  Psychiatric: She has a normal mood and affect. Her behavior is normal.  Nursing note and vitals reviewed.   BP 118/75   Pulse 80   Temp (!) 97.3 F (36.3 C) (Temporal)   Resp 17   Wt 212 lb 9.6 oz (96.4 kg)   LMP 11/07/2018   SpO2 97%   BMI 36.49 kg/m  Wt Readings from Last 3 Encounters:  02/16/19 212 lb 9.6 oz (96.4 kg)  01/26/19 199  lb (90.3 kg)  12/26/18 190 lb 6.4 oz (86.4 kg)     There are no preventive care reminders to display for this patient.   Lab Results  Component Value Date   TSH 1.420 01/26/2019   Lab Results  Component Value Date   WBC 8.1 01/26/2019   HGB 13.3 01/26/2019   HCT 38.9 01/26/2019   MCV 89 01/26/2019   PLT 225 01/26/2019   Lab Results  Component Value Date   NA 137 05/31/2015   K 3.9 05/31/2015   CO2 21 (L) 05/31/2015   GLUCOSE 92 05/31/2015   BUN 6 05/31/2015   CREATININE 0.45 05/31/2015   BILITOT 0.3 05/31/2015   ALKPHOS 168 (H) 05/31/2015   AST 22 05/31/2015   ALT 14 05/31/2015   PROT 6.5 05/31/2015   ALBUMIN 2.8 (L) 05/31/2015   CALCIUM 8.9 05/31/2015   ANIONGAP 9 05/31/2015   No results found for: CHOL No results found for: HDL No results found for: LDLCALC No results found for: TRIG No results found for: [facility] Lab Results  Component Value Date   HGBA1C 5.30 03/03/2014      Assessment & Plan:  1. Palpitations Patient with complaint of episodes of feeling as if she has increased heart rate, occasional fluttering sensation in the upper mid chest and slight shortness of breath.  Symptoms may be anxiety related but will also check BMP to make  sure that there are no electrolyte abnormalities.  Patient reports that she also had recent normal TSH.  On review of labs, patient had normal TSH 01/26/2019 per GYN. - Basic Metabolic Panel  2. Vitamin D deficiency Patient with fatigue and has had prior vitamin D deficiency.  She has had on review of chart vitamin D of 18 and 26 within the past 4 years.  She will have repeat vitamin D level at today's visit and will be notified if further treatment is needed based on the results. - Vitamin D, 25-hydroxy  3. Anxiety and depression Patient with issues with anxiety and depression.  She does not feel as if her current Lexapro is working as well as it did in the past that she still has episodes of anxiety.  She requests referral to psychiatry for further help with her anxiety and depression.  At today's visit, discussed adjustments of medications but patient prefers to wait until she is seen by psychiatry.  She is provided with refill of hydroxyzine to take as needed for acute anxiety.  She will continue present Lexapro until further evaluated by psychiatry.  She denies any current suicidal thoughts or ideations. - Ambulatory referral to Psychiatry - hydrOXYzine (ATARAX/VISTARIL) 10 MG tablet; Take 1 tablet (10 mg total) by mouth 3 (three) times daily as needed for anxiety.  Dispense: 30 tablet; Refill: 1   An After Visit Summary was printed and given to the patient.  Follow-up: Return in about 5 weeks (around 03/23/2019) for anxiety and depression.   Antony Blackbird, MD

## 2019-02-17 LAB — BASIC METABOLIC PANEL WITH GFR
BUN/Creatinine Ratio: 11 (ref 9–23)
BUN: 9 mg/dL (ref 6–20)
CO2: 20 mmol/L (ref 20–29)
Calcium: 9.6 mg/dL (ref 8.7–10.2)
Chloride: 105 mmol/L (ref 96–106)
Creatinine, Ser: 0.81 mg/dL (ref 0.57–1.00)
GFR calc Af Amer: 115 mL/min/1.73
GFR calc non Af Amer: 100 mL/min/1.73
Glucose: 89 mg/dL (ref 65–99)
Potassium: 4.6 mmol/L (ref 3.5–5.2)
Sodium: 142 mmol/L (ref 134–144)

## 2019-02-17 LAB — VITAMIN D 25 HYDROXY (VIT D DEFICIENCY, FRACTURES): Vit D, 25-Hydroxy: 15.7 ng/mL — ABNORMAL LOW (ref 30.0–100.0)

## 2019-02-18 ENCOUNTER — Other Ambulatory Visit: Payer: Self-pay | Admitting: Family Medicine

## 2019-02-18 DIAGNOSIS — E559 Vitamin D deficiency, unspecified: Secondary | ICD-10-CM

## 2019-02-18 MED ORDER — VITAMIN D (ERGOCALCIFEROL) 1.25 MG (50000 UNIT) PO CAPS: 50000.0000 [IU] | ORAL_CAPSULE | ORAL | 3 refills | Status: DC

## 2019-02-19 NOTE — BH Specialist Note (Signed)
Pt did not arrive to video visit and did not answer the phone ; Left HIPPA-compliant message to call back Roselyn Reef from [facility] at [phone].  ; left MyChart message for patient.    [place] via Telemedicine Video Visit  02/19/2019 Erin Ruiz [id number]  [place]

## 2019-03-02 ENCOUNTER — Other Ambulatory Visit: Payer: Self-pay

## 2019-03-02 ENCOUNTER — Ambulatory Visit: Payer: Medicaid Other | Admitting: Clinical

## 2019-03-02 DIAGNOSIS — Z5329 Procedure and treatment not carried out because of patient's decision for other reasons: Secondary | ICD-10-CM

## 2019-03-02 DIAGNOSIS — Z91199 Patient's noncompliance with other medical treatment and regimen due to unspecified reason: Secondary | ICD-10-CM

## 2019-03-18 MED FILL — predniSONE 10 MG TABS: 10 | 6 days supply | Qty: 21 | Fill #0

## 2019-03-20 ENCOUNTER — Telehealth (INDEPENDENT_AMBULATORY_CARE_PROVIDER_SITE_OTHER): Payer: Medicaid Other | Admitting: Internal Medicine

## 2019-03-20 ENCOUNTER — Encounter: Payer: Self-pay | Admitting: Internal Medicine

## 2019-03-20 DIAGNOSIS — F419 Anxiety disorder, unspecified: Secondary | ICD-10-CM | POA: Insufficient documentation

## 2019-03-20 DIAGNOSIS — F329 Major depressive disorder, single episode, unspecified: Secondary | ICD-10-CM | POA: Diagnosis not present

## 2019-03-20 DIAGNOSIS — R21 Rash and other nonspecific skin eruption: Secondary | ICD-10-CM

## 2019-03-20 DIAGNOSIS — F32A Depression, unspecified: Secondary | ICD-10-CM

## 2019-03-20 NOTE — Progress Notes (Signed)
Virtual Visit via MyChart  I connected with Erin Ruiz, on 03/20/2019 at 8:49 AM by MyChart due to the COVID-19 pandemic and verified that I am speaking with the correct person using two identifiers.   Consent: I discussed the limitations, risks, security and privacy concerns of performing an evaluation and management service by telephone and the availability of in person appointments. I also discussed with the patient that there may be a patient responsible charge related to this service. The patient expressed understanding and agreed to proceed.   Location of Patient: Home   Location of Provider: Clinic    Persons participating in Telemedicine visit: Erin Ruiz [facility] Dr. Juleen China      History of Present Illness: Patient scheduled for follow up to discuss anxiety and depression. She is currently taking Lexapro 30 mg daily. Reports since this increase she has not noticed any benefit in terms of improvement in mood or motivation. Struggles with feeling frustrated constantly. Lacks motivation to get things done. Finds comfort in food. Was referred to behavioral health by prior PCP as well as after speaking to a LCSW. She finally received a phone call back from [facility] and they instructed her to call [facility]. Reviews online for Erin Ruiz were poor and patient is tired of speaking to so many different people. She denies SI and says she is not worried about harming herself or others. She would like to just leave things as is for now.   Additionally, skin has been very irritated from wearing a mask at work. She has redness and bumps all along the edges of where the mask are and across her cheeks. Skin is very itchy and oily while wearing mask. She did a virtual doctor visit and was prescribed a steroid cream. She was given doctors note to work from home for remainder of week so that she could be mask-less and also given permission to wear a cloth mask at work.     Past Medical History:  Diagnosis Date  . Depression   . Encounter for supervision of other normal pregnancy in first trimester 12/02/2014  . Heart murmur    childhood  . Preeclampsia 06/01/2015   No Known Allergies  Current Outpatient Medications on File Prior to Visit  Medication Sig Dispense Refill  . BIOTIN MAXIMUM PO Take 1 tablet by mouth daily.    [name] escitalopram (LEXAPRO) 10 MG tablet Take 1 pill daily (10 mg) in addition to current 20 mg pill daily 30 tablet 3  . escitalopram (LEXAPRO) 20 MG tablet Take 1 tablet (20 mg total) by mouth daily. 90 tablet 3  . hydrOXYzine (ATARAX/VISTARIL) 10 MG tablet Take 1 tablet (10 mg total) by mouth 3 (three) times daily as needed for anxiety. 30 tablet 1   No current facility-administered medications on file prior to visit.    Observations/Objective: NAD. Speaking clearly.  Work of breathing normal.  Facial skin with generalized erythema and papules in the area of mask.  Alert and oriented. Mood appropriate.   Assessment and Plan: 1. Anxiety and depression No safety concerns at present. Discussed that dual therapy with psychotherapy and medications are best treatment for anxiety and depression. Right now patient does not want to make any changes. However, discussed that could provide list of therapists covered by Medicaid and that I could continue to manage medications for now. Could consider change in therapy to another medication or dual medication. Would potentially be a good candidate for consultation with Psychiatrist that assists at [facility].  2. Rash and nonspecific skin eruption Appears to be having an allergic reaction to masks. Advised to continue steroid cream. Discussed that she can use PO anti-histamines for additional relief. Avoid wearing make up under masks and wash face frequently. Advised to wash cloth face mask after each use and to continue to take her work breaks in car so that she can have time without the mask.    Follow  Up Instructions: Follow up in 6 months for mood check or sooner if desires.    I discussed the assessment and treatment plan with the patient. The patient was provided an opportunity to ask questions and all were answered. The patient agreed with the plan and demonstrated an understanding of the instructions.   The patient was advised to call back or seek an in-person evaluation if the symptoms worsen or if the condition fails to improve as anticipated.     I provided 18 minutes total of non-face-to-face time during this encounter including median intraservice time, reviewing previous notes, investigations, ordering medications, medical decision making, coordinating care and patient verbalized understanding at the end of the visit.    Erin Ruiz, D.O. Primary Care at [facility]  03/20/2019, 8:49 AM

## 2019-03-23 ENCOUNTER — Ambulatory Visit: Payer: Medicaid Other

## 2019-03-31 ENCOUNTER — Ambulatory Visit: Payer: Medicaid Other | Admitting: Family Medicine

## 2019-03-31 ENCOUNTER — Other Ambulatory Visit: Payer: Self-pay

## 2019-03-31 VITALS — BP 122/78 | HR 80

## 2019-03-31 DIAGNOSIS — N926 Irregular menstruation, unspecified: Secondary | ICD-10-CM

## 2019-03-31 DIAGNOSIS — Z3202 Encounter for pregnancy test, result negative: Secondary | ICD-10-CM

## 2019-03-31 LAB — POCT URINE PREGNANCY: Preg Test, Ur: NEGATIVE

## 2019-03-31 NOTE — Progress Notes (Signed)
GYNECOLOGY OFFICE NOTE  History:  29 y.o. [id number] here today for irregular cycles. She reports that she had a miscarriage in October, and that she got her period in December, but has not had one since. She has been very stressed out, and has been having a lot of problems with anxiety and depression since then.  She is on Lexapro and Atarax, but continues to feel overwhelmed at times. She is increasingly frustrated, especially now that she is breaking out on her face from wearing masks at work.  She has been having unprotected sex (same partner, no new partners), and while she is not trying to get pregnant, she is not trying to prevent pregnancy. She had 3 kids at home, and eventually would like a 4th.  Past Medical History:  Diagnosis Date  . Depression   . Encounter for supervision of other normal pregnancy in first trimester 12/02/2014  . Heart murmur    childhood  . Preeclampsia 06/01/2015    Past Surgical History:  Procedure Laterality Date  . TONSILLECTOMY  2004  . WISDOM TOOTH EXTRACTION       Current Outpatient Medications:  .  escitalopram (LEXAPRO) 20 MG tablet, Take 1 tablet (20 mg total) by mouth daily., Disp: 90 tablet, Rfl: 3 .  hydrOXYzine (ATARAX/VISTARIL) 10 MG tablet, Take 1 tablet (10 mg total) by mouth 3 (three) times daily as needed for anxiety., Disp: 30 tablet, Rfl: 1 .  BIOTIN MAXIMUM PO, Take 1 tablet by mouth daily., Disp: , Rfl:  .  escitalopram (LEXAPRO) 10 MG tablet, Take 1 pill daily (10 mg) in addition to current 20 mg pill daily (Patient not taking: Reported on 03/31/2019), Disp: 30 tablet, Rfl: 3  The following portions of the patient's history were reviewed and updated as appropriate: allergies, current medications, past family history, past medical history, past social history, past surgical history and problem list.   Review of Systems:  Pertinent items noted in HPI and remainder of comprehensive ROS otherwise negative.   Objective:  Physical  Exam BP 122/78   Pulse 80   LMP 02/06/2019 (Exact Date)  CONSTITUTIONAL: Well-developed, well-nourished female in no acute distress.  HENT:  Normocephalic, atraumatic. External right and left ear normal. Oropharynx is clear and moist EYES: Conjunctivae and EOM are normal. Pupils are equal, round, and reactive to light. No scleral icterus.  NECK: Normal range of motion, supple, no masses SKIN: Skin is warm and dry. No rash noted. Not diaphoretic. No erythema. No pallor. NEUROLOGIC: Alert and oriented to person, place, and time. Normal reflexes, muscle tone coordination. No cranial nerve deficit noted. PSYCHIATRIC: Normal mood and affect. Normal behavior. Normal judgment and thought content. CARDIOVASCULAR: Normal heart rate noted RESPIRATORY: Effort and breath sounds normal, no problems with respiration noted ABDOMEN: Soft, no distention noted.   PELVIC: Not done MUSCULOSKELETAL: Normal range of motion. No edema noted.  Labs and Imaging No results found.  Assessment & Plan:  1. Irregular menses Pregnancy test was negative. Most like irregular cycles related to recent miscarriage and stress. Extensive counseling was done for the patient and we discussed expectations. Likely her periods may remain irregular for a year after miscarriage. Stress, anxiety, and depression may also affect her cycles. Because she is having unprotected sex and wishing she was getting pregnant, it was recommended that she use condoms for 2-3 months to remove the anxiety/hope of becoming pregnant. I recommended that if she continues to have irregular menses for one year after her miscarriage, that a  work up could be done at that time. - POCT urine pregnancy  Routine preventative health maintenance measures emphasized. Please refer to After Visit Summary for other counseling recommendations.   Return for November GYN annual.  Total face-to-face time with patient: 23 minutes. Over 50% of encounter was spent on  counseling and coordination of care.  Marlowe Alt, DO OB Fellow, Faculty [facility] 03/31/2019 4:07 PM

## 2019-03-31 NOTE — Progress Notes (Signed)
Pt reports she has only had one cycle since miscarriage in 12/2018. LMP 12/4/20202. Pt is currently SA and desires pregnancy.

## 2019-03-31 NOTE — Patient Instructions (Signed)
Acne  Acne is a skin problem that causes small, red bumps (pimples) and other skin changes. The skin has tiny holes called pores. Each pore has an oil gland. Acne happens when the pores get blocked. The pores may become red, sore, and swollen. They may also become infected. Acne is common among teenagers. Acne usually goes away over time. What are the causes? This condition may be caused when:  Oil glands get blocked by oil, dead skin cells, and dirt.  Bacteria that live in the oil glands increase in number and cause infection. Acne can start with changes in hormones. These changes can occur:  When children mature into their teens (adolescence).  When women get their period (menstrual cycle).  When women are pregnant. Some things can make acne worse. They include:  Cosmetics and hair products that have oil in them.  Stress.  Diseases that cause changes in hormones.  Some medicines.  Headbands, backpacks, or shoulder pads.  Being near certain oils and chemicals.  Foods that are high in sugars. These include dairy products, sweets, and chocolates. What increases the risk? You are more likely to develop this condition if:  You are a teenager.  You have a family history of acne. What are the signs or symptoms? Symptoms of this condition include:  Small, red bumps (pimples or papules).  Whiteheads.  Blackheads.  Small, pus-filled pimples (pustules).  Big, red pimples or pustules that feel tender. Acne that is very bad can cause:  An abscess. This is an area that has pus.  Cysts. These are hard, painful sacs that have fluid.  Scars. These can happen after large pimples heal. How is this treated? Treatment for this condition depends on how bad your acne is. It may include:  Creams and lotions. These can: ? Keep the pores of your skin open. ? Prevent infections and swelling.  Medicines that treat infections (antibiotics). These can be put on your skin or taken  as pills.  Pills that decrease the amount of oil in your skin.  Birth control pills.  Light or laser treatments.  Shots of medicine into the areas with acne.  Chemicals that cause the skin to peel.  Surgery. Follow these instructions at home: Good skin care is the most important thing you can do to treat your acne. Take care of your skin as told by your doctor. You may be told to do these things:  Wash your skin gently at least two times each day. You should also wash your skin: ? After you exercise. ? Before you go to bed.  Use mild soap.  Use a water-based skin moisturizer after you wash your skin.  Use a sunscreen or sunblock with SPF 30 or greater. This is very important if you are using acne medicines.  Choose cosmetics that will not block your oil glands (are noncomedogenic). Medicines  Take over-the-counter and prescription medicines only as told by your doctor.  If you were prescribed an antibiotic medicine, use it or take it as told by your doctor. Do not stop using the antibiotic even if your acne gets better. General instructions  Keep your hair clean and off your face. Shampoo your hair on a regular basis. If you have oily hair, you may need to wash it every day.  Avoid wearing tight headbands or hats.  Avoid picking or squeezing your pimples. That can make your acne worse and cause it to scar.  Shave gently. Only shave when you have to.    Keep a food journal. This can help you see if any foods are linked to your acne.  Keep all follow-up visits as told by your doctor. This is important. Contact a doctor if:  Your acne is not better after eight weeks.  Your acne gets worse.  You have a large area of skin that is red or tender.  You think that you are having side effects from any acne medicine. Summary  Acne is a skin problem that causes pimples. Acne is common among teenagers. Acne usually goes away over time.  Acne starts with changes in your  hormones. Other causes include stress, diet, and some medicines.  Follow your doctor's instructions on how to take care of your skin. Good skin care is the most important thing you can do to treat your acne.  Take over-the-counter and prescription medicines only as told by your doctor.  Contact your doctor if you think that you are having side effects from any acne medicine. This information is not intended to replace advice given to you by your health care provider. Make sure you discuss any questions you have with your health care provider. Document Revised: 07/02/2017 Document Reviewed: 07/02/2017 Elsevier Patient Education  2020 Elsevier Inc.  

## 2019-05-20 ENCOUNTER — Ambulatory Visit
Admission: EM | Admit: 2019-05-20 | Discharge: 2019-05-20 | Disposition: A | Discharge status: Home / Self Care | Payer: Medicaid Other | Attending: Emergency Medicine | Admitting: Emergency Medicine

## 2019-05-20 ENCOUNTER — Other Ambulatory Visit: Payer: Self-pay

## 2019-05-20 DIAGNOSIS — R519 Headache, unspecified: Secondary | ICD-10-CM | POA: Diagnosis not present

## 2019-05-20 DIAGNOSIS — R110 Nausea: Secondary | ICD-10-CM | POA: Diagnosis not present

## 2019-05-20 DIAGNOSIS — Z3202 Encounter for pregnancy test, result negative: Secondary | ICD-10-CM

## 2019-05-20 DIAGNOSIS — U071 COVID-19: Secondary | ICD-10-CM | POA: Diagnosis not present

## 2019-05-20 LAB — POCT URINE PREGNANCY: Preg Test, Ur: NEGATIVE

## 2019-05-20 MED ORDER — ONDANSETRON 4 MG PO TBDP
4.0000 mg | ORAL_TABLET | Freq: Once | ORAL | Status: AC
  Administered 2019-05-20: 4 mg via ORAL

## 2019-05-20 MED ORDER — DEXAMETHASONE SODIUM PHOSPHATE 10 MG/ML IJ SOLN
10.0000 mg | Freq: Once | INTRAMUSCULAR | Status: AC
  Administered 2019-05-20: 10 mg via INTRAMUSCULAR

## 2019-05-20 MED ORDER — ONDANSETRON HCL 4 MG PO TABS: 4.0000 mg | ORAL_TABLET | Freq: Four times a day (QID) | ORAL | 0 refills | Status: DC

## 2019-05-20 MED ORDER — KETOROLAC TROMETHAMINE 60 MG/2ML IM SOLN
60.0000 mg | Freq: Once | INTRAMUSCULAR | Status: AC
  Administered 2019-05-20: 60 mg via INTRAMUSCULAR

## 2019-05-20 NOTE — Discharge Instructions (Signed)
Today you were given medications in office for your headache. You should notice improvement throughout the day. Important to keep a headache log to review with your PCP. Return for worsening headache, change in vision, lightheadedness or dizziness, nausea, vomiting, chest pain, difficulty breathing.

## 2019-05-20 NOTE — ED Provider Notes (Signed)
Erin Ruiz    CSN: [id number] Arrival date & time: 05/20/19  1038      History   Chief Complaint Chief Complaint  Patient presents with  . Dizziness    HPI Erin Ruiz is a 29 y.o. female with recent COVID-19 infection presenting for evaluation of dizziness, nausea, frontal headache.  States she had 4 episodes of emesis yesterday (all without blood/bile): None today.  Patient denies room spinning sensation, tinnitus, change in hearing or vision.  Patient also endorsing decreased appetite.  Has taken Tylenol cold with some relief of headache yesterday, nothing today.  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  Patient did have miscarriage in October and has not had a cycle since.  Patient did have negative pregnancy test in January, though has had unprotected intercourse since.  Denies abdominal pain, chest pain, cough, shortness of breath.     Past Medical History:  Diagnosis Date  . Depression   . Encounter for supervision of other normal pregnancy in first trimester 12/02/2014  . Heart murmur    childhood  . Preeclampsia 06/01/2015    Patient Active Problem List   Diagnosis Date Noted  . Anxiety and depression 03/20/2019  . SAB (spontaneous abortion) 12/26/2018    Past Surgical History:  Procedure Laterality Date  . TONSILLECTOMY  2004  . WISDOM TOOTH EXTRACTION      OB History    Gravida  4   Para  3   Term  3   Preterm      AB  1   Living  3     SAB  1   TAB      Ectopic      Multiple  0   Live Births  3            Home Medications    Prior to Admission medications   Medication Sig Start Date End Date Taking? Authorizing Provider  BIOTIN MAXIMUM PO Take 1 tablet by mouth daily.    [provider]  escitalopram (LEXAPRO) 20 MG tablet Take 1 tablet (20 mg total) by mouth daily. 06/17/18   Bing Neighbors, FNP  hydrOXYzine (ATARAX/VISTARIL) 10 MG tablet Take 1  tablet (10 mg total) by mouth 3 (three) times daily as needed for anxiety. 02/16/19   Fulp, Cammie, MD  ondansetron (ZOFRAN) 4 MG tablet Take 1 tablet (4 mg total) by mouth every 6 (six) hours. 05/20/19   Hall-Potvin, [place], PA-C    Family History Family History  Problem Relation Age of Onset  . Hypertension Mother   . Asthma Father     Social History Social History   Tobacco Use  . Smoking status: Former Games developer  . Smokeless tobacco: Never Used  Substance Use Topics  . Alcohol use: No  . Drug use: No     Allergies   Patient has no known allergies.   Review of Systems As per HPI   Physical Exam Triage Vital Signs ED Triage Vitals  Enc Vitals Group     BP      Pulse      Resp      Temp      Temp src      SpO2      Weight      Height      Head Circumference      Peak Flow      Pain Score      Pain Loc  Pain Edu?      Excl. in [place]?    No data found.  Updated Vital Signs BP 114/79 (BP Location: Left Arm)   Pulse 71   Temp 98 F (36.7 C) (Oral)   Resp 16   SpO2 98%   Breastfeeding No   Visual Acuity Right Eye Distance:   Left Eye Distance:   Bilateral Distance:    Right Eye Near:   Left Eye Near:    Bilateral Near:     Physical Exam Constitutional:      General: She is not in acute distress. HENT:     Head: Normocephalic and atraumatic.     Mouth/Throat:     Mouth: Mucous membranes are moist.     Pharynx: Oropharynx is clear. No oropharyngeal exudate or posterior oropharyngeal erythema.     Comments: Tonsils surgically absent Eyes:     General: No scleral icterus.    Pupils: Pupils are equal, round, and reactive to light.  Cardiovascular:     Rate and Rhythm: Normal rate and regular rhythm.     Heart sounds: No murmur. No gallop.   Pulmonary:     Effort: Pulmonary effort is normal. No respiratory distress.     Breath sounds: No wheezing or rales.  Abdominal:     General: Abdomen is flat. Bowel sounds are normal. There is no  distension.     Tenderness: There is no abdominal tenderness. There is no guarding.  Musculoskeletal:     Cervical back: Normal range of motion and neck supple. No tenderness.  Lymphadenopathy:     Cervical: No cervical adenopathy.  Skin:    Capillary Refill: Capillary refill takes less than 2 seconds.     Coloration: Skin is not jaundiced or pale.     Findings: No rash.  Neurological:     General: No focal deficit present.     Mental Status: She is alert and oriented to person, place, and time.     Cranial Nerves: No cranial nerve deficit.     Sensory: No sensory deficit.     Motor: No weakness.     Coordination: Coordination normal.     Gait: Gait normal.     Deep Tendon Reflexes: Reflexes normal.      UC Treatments / Results  Labs (all labs ordered are listed, but only abnormal results are displayed) Labs Reviewed  POCT URINE PREGNANCY - Normal    EKG   Radiology No results found.  Procedures Procedures (including critical Ruiz time)  Medications Ordered in UC Medications  ketorolac (TORADOL) injection 60 mg (60 mg Intramuscular Given 05/20/19 1133)  dexamethasone (DECADRON) injection 10 mg (10 mg Intramuscular Given 05/20/19 1133)  ondansetron (ZOFRAN-ODT) disintegrating tablet 4 mg (4 mg Oral Given 05/20/19 1133)    Initial Impression / Assessment and Plan / UC Course  I have reviewed the triage vital signs and the nursing notes.  Pertinent labs & imaging results that were available during my Ruiz of the patient were reviewed by me and considered in my medical decision making (see chart for details).     Patient afebrile, nontoxic, and without neurocognitive deficit in office today.  Urine pregnancy negative.  Patient on day 12 since symptom onset and is without respiratory symptoms at this time.  Patient largely concerned as she was supposed to return to work, though has not been feeling well.  Patient given Toradol, Decadron, Zofran in office which she  tolerated well.  Reporting improvement in symptoms at time  of discharge.  Provided work note for few more days off to rest/perform supportive Ruiz and monitor symptoms.  Patient to follow-up with PCP for persistent, worsening symptoms.  Return precautions discussed, patient verbalized understanding and is agreeable to plan. Final Clinical Impressions(s) / UC Diagnoses   Final diagnoses:  COVID-19 virus infection  Nausea without vomiting  Acute nonintractable headache, unspecified headache type     Discharge Instructions     Today you were given medications in office for your headache. You should notice improvement throughout the day. Important to keep a headache log to review with your PCP. Return for worsening headache, change in vision, lightheadedness or dizziness, nausea, vomiting, chest pain, difficulty breathing.    ED Prescriptions    Medication Sig Dispense Auth. Provider   ondansetron (ZOFRAN) 4 MG tablet Take 1 tablet (4 mg total) by mouth every 6 (six) hours. 12 tablet Hall-Potvin, [place], PA-C     PDMP not reviewed this encounter.   Hall-Potvin, [place], [place] 05/20/19 1846

## 2019-05-20 NOTE — ED Triage Notes (Signed)
Pt c/o dizziness, headaches, nausea and vomiting x4 since yesterday. States went back to work yesterday after being quarantined  from covid on 03/08.

## 2019-05-25 ENCOUNTER — Encounter: Payer: Self-pay | Admitting: Internal Medicine

## 2019-05-25 ENCOUNTER — Ambulatory Visit (INDEPENDENT_AMBULATORY_CARE_PROVIDER_SITE_OTHER): Payer: Medicaid Other | Admitting: Internal Medicine

## 2019-05-25 DIAGNOSIS — N926 Irregular menstruation, unspecified: Secondary | ICD-10-CM | POA: Diagnosis not present

## 2019-05-25 DIAGNOSIS — R5383 Other fatigue: Secondary | ICD-10-CM | POA: Diagnosis not present

## 2019-05-25 DIAGNOSIS — U071 COVID-19: Secondary | ICD-10-CM

## 2019-05-25 NOTE — Progress Notes (Signed)
Virtual Visit via Telephone Note  I connected with Erin Ruiz, on 05/25/2019 at 4:15 PM by telephone due to the COVID-19 pandemic and verified that I am speaking with the correct person using two identifiers.   Consent: I discussed the limitations, risks, security and privacy concerns of performing an evaluation and management service by telephone and the availability of in person appointments. I also discussed with the patient that there may be a patient responsible charge related to this service. The patient expressed understanding and agreed to proceed.   Location of Patient: Home   Location of Provider: Clinic    Persons participating in Telemedicine visit: Talayeh Bruinsma [facility] Dr. Earlene Plater      History of Present Illness: Patient has a follow up for COVID+ diagnosis. She was diagnosed on 3/8. She had severe body aches, fevers, vomiting, diarrhea for the week after diagnosis. Those have resolved. However, she is still having lingering fatigue and headaches. She is able to complete daily tasks. Afebrile.    Past Medical History:  Diagnosis Date  . Depression   . Encounter for supervision of other normal pregnancy in first trimester 12/02/2014  . Heart murmur    childhood  . Preeclampsia 06/01/2015   No Known Allergies  Current Outpatient Medications on File Prior to Visit  Medication Sig Dispense Refill  . BIOTIN MAXIMUM PO Take 1 tablet by mouth daily.    [name] escitalopram (LEXAPRO) 20 MG tablet Take 1 tablet (20 mg total) by mouth daily. 90 tablet 3  . hydrOXYzine (ATARAX/VISTARIL) 10 MG tablet Take 1 tablet (10 mg total) by mouth 3 (three) times daily as needed for anxiety. 30 tablet 1  . ondansetron (ZOFRAN) 4 MG tablet Take 1 tablet (4 mg total) by mouth every 6 (six) hours. 12 tablet 0   No current facility-administered medications on file prior to visit.    Observations/Objective: NAD. Speaking clearly.  Work of breathing normal.  Alert and  oriented. Mood appropriate.   Assessment and Plan: 1. COVID-19 virus infection Patient with recent +COVID test. Symptoms have improved but does have some lingering symptoms likely related to virus. Discussed some patients do have prolonged low grade symptoms. Encouraged supportive care tactics. Continue 3 W's and receive vaccination 90 days after illness if qualifies.   2. Irregular menses Started after early SAB in fall 2020. May be related to SAB. However, patient also with weight gain. Discussed Lexapro unlikely etiology. Has recent normal TSH. Change in weight may be causing irregular menses as well. Patient plans to follow up with OB-GYN she is established with.    Follow Up Instructions: PRN    I discussed the assessment and treatment plan with the patient. The patient was provided an opportunity to ask questions and all were answered. The patient agreed with the plan and demonstrated an understanding of the instructions.   The patient was advised to call back or seek an in-person evaluation if the symptoms worsen or if the condition fails to improve as anticipated.     I provided 12 minutes total of non-face-to-face time during this encounter including median intraservice time, reviewing previous notes, investigations, ordering medications, medical decision making, coordinating care and patient verbalized understanding at the end of the visit.    Marcy Siren, D.O. Primary Care at [facility]  05/25/2019, 4:15 PM

## 2019-06-02 ENCOUNTER — Ambulatory Visit: Payer: Medicaid Other | Admitting: Obstetrics & Gynecology

## 2019-06-03 ENCOUNTER — Ambulatory Visit: Payer: Medicaid Other | Admitting: Obstetrics & Gynecology

## 2019-06-03 ENCOUNTER — Other Ambulatory Visit: Payer: Self-pay | Admitting: Obstetrics & Gynecology

## 2019-06-03 ENCOUNTER — Other Ambulatory Visit: Payer: Self-pay

## 2019-06-03 ENCOUNTER — Encounter: Payer: Self-pay | Admitting: Obstetrics & Gynecology

## 2019-06-03 VITALS — BP 123/77 | HR 88 | Wt 225.5 lb

## 2019-06-03 DIAGNOSIS — Z3202 Encounter for pregnancy test, result negative: Secondary | ICD-10-CM | POA: Diagnosis not present

## 2019-06-03 DIAGNOSIS — Z3200 Encounter for pregnancy test, result unknown: Secondary | ICD-10-CM

## 2019-06-03 DIAGNOSIS — Z30011 Encounter for initial prescription of contraceptive pills: Secondary | ICD-10-CM

## 2019-06-03 LAB — POCT URINE PREGNANCY: Preg Test, Ur: NEGATIVE

## 2019-06-03 MED ORDER — NORETHIN ACE-ETH ESTRAD-FE 1-20 MG-MCG(24) PO TABS: 1.0000 | ORAL_TABLET | Freq: Every day | ORAL | 11 refills | Status: DC

## 2019-06-03 NOTE — Patient Instructions (Signed)

## 2019-06-03 NOTE — Progress Notes (Signed)
Patient is in the office complaining of not having cycles since SAB in Oct 2020. Pt reports that she had some bleeding in December, but none since, reports feeling cramps but no bleeding. Pt reports that she is sexually active.

## 2019-06-03 NOTE — Progress Notes (Signed)
Patient ID: Erin Ruiz, female   DOB: Aug 13, 1990, 29 y.o.   MRN: [id number]  Chief Complaint  Patient presents with  . GYN  no menses since miscarriage  HPI Erin Ruiz is a 29 y.o. female.  [id number] No LMP recorded. She returns still concerned about no menses since SAb. She has sx she considers prodromal to menses but no bleeding occurs. She is willing to have cycle control with OCP HPI  Past Medical History:  Diagnosis Date  . Depression   . Encounter for supervision of other normal pregnancy in first trimester 12/02/2014  . Heart murmur    childhood  . Preeclampsia 06/01/2015    Past Surgical History:  Procedure Laterality Date  . TONSILLECTOMY  2004  . WISDOM TOOTH EXTRACTION      Family History  Problem Relation Age of Onset  . Hypertension Mother   . Asthma Father     Social History Social History   Tobacco Use  . Smoking status: Former Games developer  . Smokeless tobacco: Never Used  Substance Use Topics  . Alcohol use: No  . Drug use: No    No Known Allergies  Current Outpatient Medications  Medication Sig Dispense Refill  . BIOTIN MAXIMUM PO Take 1 tablet by mouth daily.    . hydrOXYzine (ATARAX/VISTARIL) 10 MG tablet Take 1 tablet (10 mg total) by mouth 3 (three) times daily as needed for anxiety. 30 tablet 1  . escitalopram (LEXAPRO) 20 MG tablet Take 1 tablet (20 mg total) by mouth daily. (Patient not taking: Reported on 06/03/2019) 90 tablet 3  . Norethindrone Acetate-Ethinyl Estrad-FE (LOESTRIN 24 FE) 1-20 MG-MCG(24) tablet Take 1 tablet by mouth daily. 1 Package 11  . ondansetron (ZOFRAN) 4 MG tablet Take 1 tablet (4 mg total) by mouth every 6 (six) hours. (Patient not taking: Reported on 06/03/2019) 12 tablet 0   No current facility-administered medications for this visit.    Review of Systems Review of Systems  Constitutional: Negative.   Respiratory: Negative.   Cardiovascular: Negative.   Gastrointestinal: Negative.   Endocrine:       PMS   Genitourinary: Positive for menstrual problem.    Blood pressure 123/77, pulse 88, weight 225 lb 8 oz (102.3 kg).  Physical Exam Physical Exam Vitals and nursing note reviewed.  Constitutional:      Appearance: Normal appearance.  Cardiovascular:     Rate and Rhythm: Normal rate.  Pulmonary:     Effort: Pulmonary effort is normal.  Abdominal:     General: There is no distension.  Neurological:     Mental Status: She is alert.     Data Reviewed Office notes  Pap 2019 nl  Assessment Possible anovulation following pregnancy Not seeking pregnancy now  Plan Cycle control, BC with LoEstrin, Rx sent  Notify us if she still has problems with her menses    Scheryl Darter 06/03/2019, 9:18 AM

## 2019-06-23 ENCOUNTER — Other Ambulatory Visit: Payer: Self-pay

## 2019-06-23 ENCOUNTER — Ambulatory Visit (INDEPENDENT_AMBULATORY_CARE_PROVIDER_SITE_OTHER)
Admission: RE | Admit: 2019-06-23 | Discharge: 2019-06-23 | Disposition: A | Discharge status: Home / Self Care | Payer: Medicaid Other | Source: Ambulatory Visit

## 2019-06-23 DIAGNOSIS — K0889 Other specified disorders of teeth and supporting structures: Secondary | ICD-10-CM

## 2019-06-23 MED ORDER — AMOXICILLIN 875 MG PO TABS: 875.0000 mg | ORAL_TABLET | Freq: Two times a day (BID) | ORAL | 0 refills | Status: AC

## 2019-06-23 MED ORDER — TRAMADOL HCL 50 MG PO TABS: 50.0000 mg | ORAL_TABLET | Freq: Four times a day (QID) | ORAL | 0 refills | Status: DC | PRN

## 2019-06-23 NOTE — Discharge Instructions (Addendum)
Take the antibiotic as prescribed.  Take the tramadol as needed for severe pain; do not drive a car, drink alcohol, or use machinery while taking this medication as it will cause drowsiness.    A dental resource guide is attached.  Please call to make an appointment with a dentist as soon as possible.    Come to the Urgent Care or go to the Emergency Department if you develop increased pain, swelling, fever, chills, or other concerning symptoms.

## 2019-06-23 NOTE — ED Provider Notes (Signed)
Virtual Visit via Video Note:  Erin Ruiz  initiated request for Telemedicine visit with [facility] Urgent Care team. I connected with Erin Ruiz  on 06/23/2019 at 1:52 PM  for a synchronized telemedicine visit using a video enabled HIPPA compliant telemedicine application. I verified that I am speaking with Erin Ruiz  using two identifiers. Mickie Bail, NP  was physically located in a [facility] Urgent care site and Shaylea Ucci was located at a different location.   The limitations of evaluation and management by telemedicine as well as the availability of in-person appointments were discussed. Patient was informed that she  may incur a bill ( including co-pay) for this virtual visit encounter. Erin Ruiz  expressed understanding and gave verbal consent to proceed with virtual visit.     History of Present Illness:Erin Ruiz  is a 29 y.o. female presents for evaluation of toothache on her right upper side x 2 months.  She had an appointment with her dentist but then was positive for COVID so she couldn't keep the appointment.  She states the dentist will see her 2 months after having COVID.  She has been alternating Tylenol and ibuprofen.  She also has been doing salt water gargles.  No difficulty swallowing.  No fever or chills.   She denies pregnancy or breastfeeding.     No Known Allergies   Past Medical History:  Diagnosis Date  . Depression   . Encounter for supervision of other normal pregnancy in first trimester 12/02/2014  . Heart murmur    childhood  . Preeclampsia 06/01/2015     Social History   Tobacco Use  . Smoking status: Former Games developer  . Smokeless tobacco: Never Used  Substance Use Topics  . Alcohol use: No  . Drug use: No   ROS: as stated in HPI.  All other systems reviewed and negative.      Observations/Objective: Physical Exam  VITALS: Patient denies fever. GENERAL: Alert, appears well and in no acute distress. HEENT:  Atraumatic.  Speech clear.  No difficulty swallowing.  NECK: Normal movements of the head and neck. CARDIOPULMONARY: No increased WOB. Speaking in clear sentences. I:E ratio WNL.  MS: Moves all visible extremities without noticeable abnormality. PSYCH: Pleasant and cooperative, well-groomed. Speech normal rate and rhythm. Affect is appropriate. Insight and judgement are appropriate. Attention is focused, linear, and appropriate.  NEURO: CN grossly intact. Oriented as arrived to appointment on time with no prompting. Moves both UE equally.  SKIN: No obvious lesions, wounds, erythema, or cyanosis noted on face or hands.   Assessment and Plan:    ICD-10-CM   1. Toothache  K08.89        Follow Up Instructions: Treating with amoxicillin.  10 tablets of tramadol prescribed for severe pain; PDMP reviewed.  Instructed patient to continue Tylenol or ibuprofen for mild to moderate pain.  Instructed patient to schedule appointment with her dentist as soon as possible; dental resource guide provided.  Instructed patient to come to the UC or go to the ED if her symptoms worsen.  Patient agrees to plan of care.      I discussed the assessment and treatment plan with the patient. The patient was provided an opportunity to ask questions and all were answered. The patient agreed with the plan and demonstrated an understanding of the instructions.   The patient was advised to call back or seek an in-person evaluation if the symptoms worsen or if the condition fails to improve as  anticipated.      Sharion Balloon, NP  06/23/2019 1:52 PM         Sharion Balloon, NP 06/23/19 1352

## 2019-08-04 ENCOUNTER — Ambulatory Visit: Payer: Medicaid Other

## 2019-08-04 ENCOUNTER — Other Ambulatory Visit (HOSPITAL_COMMUNITY)
Admission: RE | Admit: 2019-08-04 | Discharge: 2019-08-04 | Disposition: A | Discharge status: Home / Self Care | Payer: Medicaid Other | Source: Ambulatory Visit | Attending: Obstetrics | Admitting: Obstetrics

## 2019-08-04 ENCOUNTER — Other Ambulatory Visit: Payer: Self-pay

## 2019-08-04 DIAGNOSIS — N898 Other specified noninflammatory disorders of vagina: Secondary | ICD-10-CM

## 2019-08-04 NOTE — Progress Notes (Signed)
Pt is in the office for self swab. Pt reports recent sexual activity and vaginal irritation, desires std testing.

## 2019-08-05 LAB — CERVICOVAGINAL ANCILLARY ONLY
Bacterial Vaginitis (gardnerella): NEGATIVE
Candida Glabrata: NEGATIVE
Candida Vaginitis: POSITIVE — AB
Chlamydia: NEGATIVE
Comment: NEGATIVE
Comment: NEGATIVE
Comment: NEGATIVE
Comment: NEGATIVE
Comment: NEGATIVE
Comment: NORMAL
Neisseria Gonorrhea: NEGATIVE
Trichomonas: NEGATIVE

## 2019-08-10 ENCOUNTER — Other Ambulatory Visit: Payer: Self-pay | Admitting: Obstetrics

## 2019-08-10 DIAGNOSIS — F32A Depression, unspecified: Secondary | ICD-10-CM

## 2019-08-10 DIAGNOSIS — F419 Anxiety disorder, unspecified: Secondary | ICD-10-CM

## 2019-08-10 MED ORDER — FLUCONAZOLE 150 MG PO TABS: 150.0000 mg | ORAL_TABLET | ORAL | 1 refills | Status: AC

## 2019-08-10 NOTE — Addendum Note (Signed)
Addended by: Raynelle Dick on: 08/10/2019 09:12 AM   Modules accepted: Orders

## 2019-08-11 ENCOUNTER — Telehealth: Payer: Self-pay | Admitting: Internal Medicine

## 2019-08-11 DIAGNOSIS — F32A Depression, unspecified: Secondary | ICD-10-CM

## 2019-08-11 MED ORDER — ESCITALOPRAM OXALATE 20 MG PO TABS: 20.0000 mg | ORAL_TABLET | Freq: Every day | ORAL | 0 refills | Status: DC

## 2019-08-11 NOTE — Telephone Encounter (Signed)
Refill sent.

## 2019-08-11 NOTE — Telephone Encounter (Signed)
1) Medication(s) Requested (by name): escitalopram (LEXAPRO) 20 MG tablet [[id number]  2) Pharmacy of Choice:[facility] - [place], [place] - [address] [facility].  [address] [place] [address]   3) Special Requests:   Approved medications will be sent to the pharmacy, we will reach out if there is an issue.  Requests made after 3pm may not be addressed until the following business day!  If a patient is unsure of the name of the medication(s) please note and ask patient to call back when they are able to provide all info, do not send to responsible party until all information is available!

## 2019-08-24 ENCOUNTER — Encounter: Payer: Self-pay | Admitting: Internal Medicine

## 2019-08-24 ENCOUNTER — Telehealth (INDEPENDENT_AMBULATORY_CARE_PROVIDER_SITE_OTHER): Payer: Medicaid Other | Admitting: Internal Medicine

## 2019-08-24 DIAGNOSIS — R000 Tachycardia, unspecified: Secondary | ICD-10-CM | POA: Diagnosis not present

## 2019-08-24 NOTE — Progress Notes (Signed)
Virtual Visit via Telephone Note  I connected with Erin Ruiz, on 08/24/2019 at 8:53 AM by telephone due to the COVID-19 pandemic and verified that I am speaking with the correct person using two identifiers.   Consent: I discussed the limitations, risks, security and privacy concerns of performing an evaluation and management service by telephone and the availability of in person appointments. I also discussed with the patient that there may be a patient responsible charge related to this service. The patient expressed understanding and agreed to proceed.   Location of Patient: Home   Location of Provider: Clinic    Persons participating in Telemedicine visit: Cynthya Yam [facility] Dr. Earlene Plater    History of Present Illness: Patient has a visit for concern for HR elevations. Reports that she was laying on the bed for 15-20 minutes scrolling through her phone and checked her HR and it was 136. In the evening two weeks ago, her HR jumped up to 175 while giving her son a bath.   She started going to the gym 3 weeks ago. While working out, HR never goes above 150.   She doesn't recall having any symptoms when her HR gets elevated. No chest pain, chest tightness, trouble breathing associated with elevated pulse.   She has an Scientist, physiological that is currently measuring her HR at 83 with resting average in 60s and walking average of 100.   History of heart murmur as a child but no one said anything to her as she got older. No other cardiac history.    Past Medical History:  Diagnosis Date  . Depression   . Encounter for supervision of other normal pregnancy in first trimester 12/02/2014  . Heart murmur    childhood  . Preeclampsia 06/01/2015   No Known Allergies  Current Outpatient Medications on File Prior to Visit  Medication Sig Dispense Refill  . BIOTIN MAXIMUM PO Take 1 tablet by mouth daily.    [name] escitalopram (LEXAPRO) 20 MG tablet Take 1 tablet (20 mg total) by  mouth daily. 90 tablet 0  . Norethindrone Acetate-Ethinyl Estrad-FE (LOESTRIN 24 FE) 1-20 MG-MCG(24) tablet Take 1 tablet by mouth daily. 1 Package 11   No current facility-administered medications on file prior to visit.    Observations/Objective: NAD. Speaking clearly.  Work of breathing normal.  Alert and oriented. Mood appropriate.   Assessment and Plan: 1. Tachycardia Infrequent and asymptomatic. Discussed with patient that pulse can elevate in response to a variety of things including physical activity, stress, anxiety, dehydration, etc. Prior HRs at office visits have been within goal. No prior EKG on file. Low suspicion for something such as Afib with RVR given lack of chronicity and asymptomatic. SVT also less likely given asymptomatic status. Potential that she has asymptomatic PVCs that are impacting pulse rate occasionally. May simply be heart rate elevations in response to something non-cardiac such as anxiety. Patient would like to continue to monitor at home, if she becomes symptomatic or frequency increases will plan to evaluate further starting with EKG.   Follow Up Instructions: PRN and for routine medical care    I discussed the assessment and treatment plan with the patient. The patient was provided an opportunity to ask questions and all were answered. The patient agreed with the plan and demonstrated an understanding of the instructions.   The patient was advised to call back or seek an in-person evaluation if the symptoms worsen or if the condition fails to improve as anticipated.  I provided 14 minutes total of non-face-to-face time during this encounter including median intraservice time, reviewing previous notes, investigations, ordering medications, medical decision making, coordinating care and patient verbalized understanding at the end of the visit.    Phill Myron, D.O. Primary Care at [facility]  08/24/2019, 8:53 AM

## 2019-08-28 ENCOUNTER — Other Ambulatory Visit: Payer: Self-pay | Admitting: Internal Medicine

## 2019-08-28 ENCOUNTER — Encounter: Payer: Self-pay | Admitting: Neurology

## 2019-08-28 DIAGNOSIS — R519 Headache, unspecified: Secondary | ICD-10-CM

## 2019-09-10 ENCOUNTER — Other Ambulatory Visit: Payer: Self-pay | Admitting: Internal Medicine

## 2019-09-10 DIAGNOSIS — I781 Nevus, non-neoplastic: Secondary | ICD-10-CM

## 2019-09-10 DIAGNOSIS — L918 Other hypertrophic disorders of the skin: Secondary | ICD-10-CM

## 2019-09-23 ENCOUNTER — Telehealth (INDEPENDENT_AMBULATORY_CARE_PROVIDER_SITE_OTHER): Payer: Medicaid Other | Admitting: Internal Medicine

## 2019-09-23 ENCOUNTER — Encounter: Payer: Self-pay | Admitting: Internal Medicine

## 2019-09-23 DIAGNOSIS — Z0289 Encounter for other administrative examinations: Secondary | ICD-10-CM | POA: Diagnosis not present

## 2019-09-23 DIAGNOSIS — F329 Major depressive disorder, single episode, unspecified: Secondary | ICD-10-CM | POA: Diagnosis not present

## 2019-09-23 DIAGNOSIS — F419 Anxiety disorder, unspecified: Secondary | ICD-10-CM | POA: Diagnosis not present

## 2019-09-23 DIAGNOSIS — F32A Depression, unspecified: Secondary | ICD-10-CM

## 2019-09-23 NOTE — Progress Notes (Signed)
Virtual Visit via Telephone Note  I connected with Erin Ruiz, on 09/23/2019 at 11:06 AM by telephone due to the COVID-19 pandemic and verified that I am speaking with the correct person using two identifiers.   Consent: I discussed the limitations, risks, security and privacy concerns of performing an evaluation and management service by telephone and the availability of in person appointments. I also discussed with the patient that there may be a patient responsible charge related to this service. The patient expressed understanding and agreed to proceed.   Location of Patient: Home   Location of Provider: Clinic    Persons participating in Telemedicine visit: Roz[facility] Dr. Earlene Plater      History of Present Illness: Patient has a visit to discuss FMLA paperwork. Filed due to missing 1-2 days per week. Doesn't want to jeopardize her job. Having recurrent episodes of anxiety and depression. Unable to find the strength to go to work when this occurs. Has tried to establish with therapist but has been told the wait times are too long and was also referred to [facility] which has since closed. Knows that she needs to see someone consistently. Compliant with Lexapro.    Past Medical History:  Diagnosis Date   Depression    Encounter for supervision of other normal pregnancy in first trimester 12/02/2014   Heart murmur    childhood   Preeclampsia 06/01/2015   No Known Allergies  Current Outpatient Medications on File Prior to Visit  Medication Sig Dispense Refill   BIOTIN MAXIMUM PO Take 1 tablet by mouth daily.     escitalopram (LEXAPRO) 20 MG tablet Take 1 tablet (20 mg total) by mouth daily. 90 tablet 0   Norethindrone Acetate-Ethinyl Estrad-FE (LOESTRIN 24 FE) 1-20 MG-MCG(24) tablet Take 1 tablet by mouth daily. 1 Package 11   No current facility-administered medications on file prior to visit.    Observations/Objective: NAD. Speaking clearly.   Work of breathing normal.  Alert and oriented. Mood appropriate.   Assessment and Plan: 1. Encounter for completion of form with patient FMLA paperwork completed for intermittent level for anxiety and depression.   2. Anxiety and depression - Ambulatory referral to Psychology    Follow Up Instructions: 6 week f/u    I discussed the assessment and treatment plan with the patient. The patient was provided an opportunity to ask questions and all were answered. The patient agreed with the plan and demonstrated an understanding of the instructions.   The patient was advised to call back or seek an in-person evaluation if the symptoms worsen or if the condition fails to improve as anticipated.     I provided 24 minutes total of non-face-to-face time during this encounter including median intraservice time, reviewing previous notes, investigations, ordering medications, medical decision making, coordinating care and patient verbalized understanding at the end of the visit.    Marcy Siren, D.O. Primary Care at [facility]  09/23/2019, 11:06 AM

## 2019-09-29 ENCOUNTER — Other Ambulatory Visit: Payer: Self-pay

## 2019-09-29 ENCOUNTER — Emergency Department (HOSPITAL_COMMUNITY)
Admission: EM | Admit: 2019-09-29 | Discharge: 2019-09-29 | Discharge status: Against Medical Advice | Payer: Medicaid Other

## 2019-09-29 ENCOUNTER — Ambulatory Visit
Admission: EM | Admit: 2019-09-29 | Discharge: 2019-09-29 | Disposition: A | Discharge status: Home / Self Care | Payer: Medicaid Other | Attending: Physician Assistant | Admitting: Physician Assistant

## 2019-09-29 DIAGNOSIS — R42 Dizziness and giddiness: Secondary | ICD-10-CM | POA: Diagnosis not present

## 2019-09-29 DIAGNOSIS — R112 Nausea with vomiting, unspecified: Secondary | ICD-10-CM

## 2019-09-29 MED ORDER — ONDANSETRON 4 MG PO TBDP: 4.0000 mg | ORAL_TABLET | Freq: Three times a day (TID) | ORAL | 0 refills | Status: DC | PRN

## 2019-09-29 MED ORDER — MECLIZINE HCL 12.5 MG PO TABS: 12.5000 mg | ORAL_TABLET | Freq: Three times a day (TID) | ORAL | 0 refills | Status: DC | PRN

## 2019-09-29 MED FILL — ONDANSETRON ODT 4 MG TABLET: 4 | 7 days supply | Qty: 20 | Fill #0

## 2019-09-29 NOTE — Discharge Instructions (Signed)
No alarming signs on exam. Start meclizine as needed for dizziness. Zofran as needed for nausea/vomiting. Keep hydrated, urine should be clear to pale yellow in color. Keep monitoring symptoms. If worsening symptoms, passing out, confusion, vomiting not controlled by medicine, go to the emergency department for further evaluation.

## 2019-09-29 NOTE — ED Provider Notes (Signed)
[facility]    CSN: [id number] Arrival date & time: 09/29/19  0803      History   Chief Complaint Chief Complaint  Patient presents with   Dizziness    HPI Delonda Coley is a 29 y.o. female.   29 year old female comes in for few hour history of dizziness and vomiting. Dizziness with spinning sensation, no obvious worsening with movement, improvement with vomiting. Nausea 5 episodes of NBNB vomiting. No abdominal pain, diarrhea. Denies URI symptoms, fever, urinary changes. Denies head injury, loss of consciousness. No hematuria, melena, hematochezia. Patient started OCPs 3 months ago, had spotting for 1 month, slight spotting last month. Denies heavy bleeding. Took urine preg at home with negative results. Former smoker. No alcohol recenty.      Past Medical History:  Diagnosis Date   Depression    Encounter for supervision of other normal pregnancy in first trimester 12/02/2014   Heart murmur    childhood   Preeclampsia 06/01/2015    Patient Active Problem List   Diagnosis Date Noted   Anxiety and depression 03/20/2019   SAB (spontaneous abortion) 12/26/2018    Past Surgical History:  Procedure Laterality Date   TONSILLECTOMY  2004   WISDOM TOOTH EXTRACTION      OB History    Gravida  4   Para  3   Term  3   Preterm      AB  1   Living  3     SAB  1   TAB      Ectopic      Multiple  0   Live Births  3            Home Medications    Prior to Admission medications   Medication Sig Start Date End Date Taking? Authorizing Provider  BIOTIN MAXIMUM PO Take 1 tablet by mouth daily.    [provider]  escitalopram (LEXAPRO) 20 MG tablet Take 1 tablet (20 mg total) by mouth daily. 08/11/19   Arvilla Market, DO  meclizine (ANTIVERT) 12.5 MG tablet Take 1 tablet (12.5 mg total) by mouth 3 (three) times daily as needed for dizziness. 09/29/19   Cathie Hoops, Ester Hilley V, PA-C  Norethindrone Acetate-Ethinyl Estrad-FE  (LOESTRIN 24 FE) 1-20 MG-MCG(24) tablet Take 1 tablet by mouth daily. 06/03/19   Adam Phenix, MD  ondansetron (ZOFRAN ODT) 4 MG disintegrating tablet Take 1 tablet (4 mg total) by mouth every 8 (eight) hours as needed for nausea or vomiting. 09/29/19   Belinda Fisher, PA-C    Family History Family History  Problem Relation Age of Onset   Hypertension Mother    Asthma Father     Social History Social History   Tobacco Use   Smoking status: Former Smoker   Smokeless tobacco: Never Used  Building services engineer Use: Never used  Substance Use Topics   Alcohol use: No   Drug use: No     Allergies   Patient has no known allergies.   Review of Systems Review of Systems  Reason unable to perform ROS: See HPI as above.     Physical Exam Triage Vital Signs ED Triage Vitals  Enc Vitals Group     BP 09/29/19 0814 (!) 144/81     Pulse Rate 09/29/19 0814 74     Resp 09/29/19 0814 18     Temp 09/29/19 0814 97.9 F (36.6 C)     Temp Source 09/29/19 0814 Oral  SpO2 09/29/19 0814 98 %     Weight --      Height --      Head Circumference --      Peak Flow --      Pain Score 09/29/19 0815 0     Pain Loc --      Pain Edu? --      Excl. in GC? --    No data found.  Updated Vital Signs BP (!) 144/81 (BP Location: Left Arm)    Pulse 74    Temp 97.9 F (36.6 C) (Oral)    Resp 18    SpO2 98%    Breastfeeding No Comment: miscarriage 12/2018  Physical Exam Constitutional:      General: She is not in acute distress.    Appearance: Normal appearance. She is well-developed. She is not ill-appearing, toxic-appearing or diaphoretic.  HENT:     Head: Normocephalic and atraumatic.     Right Ear: Tympanic membrane, ear canal and external ear normal. Tympanic membrane is not erythematous or bulging.     Left Ear: Tympanic membrane, ear canal and external ear normal. Tympanic membrane is not erythematous or bulging.     Nose:     Right Sinus: No maxillary sinus tenderness or  frontal sinus tenderness.     Left Sinus: No maxillary sinus tenderness or frontal sinus tenderness.     Mouth/Throat:     Mouth: Mucous membranes are moist.     Pharynx: Oropharynx is clear. Uvula midline.  Eyes:     Extraocular Movements: Extraocular movements intact.     Right eye: No nystagmus.     Left eye: No nystagmus.     Conjunctiva/sclera: Conjunctivae normal.     Pupils: Pupils are equal, round, and reactive to light.  Cardiovascular:     Rate and Rhythm: Normal rate and regular rhythm.  Pulmonary:     Effort: Pulmonary effort is normal. No accessory muscle usage, prolonged expiration, respiratory distress or retractions.     Breath sounds: No decreased air movement or transmitted upper airway sounds. No decreased breath sounds.     Comments: LCTAB Musculoskeletal:     Cervical back: Normal range of motion and neck supple.  Skin:    General: Skin is warm and dry.  Neurological:     Mental Status: She is alert and oriented to person, place, and time.     Comments: Cranial nerves II-XII grossly intact. Strength 5/5 bilaterally for upper and lower extremity. Sensation intact. Normal coordination with normal finger to nose, heel to shin. Negative pronator drift, romberg. Gait intact. Able to ambulate on own without difficulty.       UC Treatments / Results  Labs (all labs ordered are listed, but only abnormal results are displayed) Labs Reviewed - No data to display  EKG   Radiology No results found.  Procedures Procedures (including critical care time)  Medications Ordered in UC Medications - No data to display  Initial Impression / Assessment and Plan / UC Course  I have reviewed the triage vital signs and the nursing notes.  Pertinent labs & imaging results that were available during my care of the patient were reviewed by me and considered in my medical decision making (see chart for details).    Neurology exam grossly intact. Able to ambulate on own  without difficulty. Will provide symptomatic treatment for now with zofran and meclizine. Push fluids. Return precautions given. Patient expresses understanding and agrees to plan.   Final  Clinical Impressions(s) / UC Diagnoses   Final diagnoses:  Dizziness  Intractable vomiting with nausea, unspecified vomiting type   ED Prescriptions    Medication Sig Dispense Auth. Provider   ondansetron (ZOFRAN ODT) 4 MG disintegrating tablet Take 1 tablet (4 mg total) by mouth every 8 (eight) hours as needed for nausea or vomiting. 20 tablet Lumina Gitto V, PA-C   meclizine (ANTIVERT) 12.5 MG tablet Take 1 tablet (12.5 mg total) by mouth 3 (three) times daily as needed for dizziness. 30 tablet Belinda Fisher, PA-C     PDMP not reviewed this encounter.   Belinda Fisher, PA-C 09/29/19 [phone]

## 2019-09-29 NOTE — ED Triage Notes (Signed)
Pt c/o dizziness and vomiting x5 since 530am. States has relief after vomiting and then the dizziness comes back. States no pain but her body feels heavy.

## 2019-10-12 ENCOUNTER — Telehealth: Payer: Medicaid Other | Admitting: Internal Medicine

## 2019-10-22 ENCOUNTER — Telehealth: Payer: Self-pay | Admitting: Internal Medicine

## 2019-10-22 NOTE — Telephone Encounter (Signed)
It is fine for her next visit to be scheduled in person.   Marcy Siren, D.O. Primary Care at [facility]  10/22/2019, 9:20 PM

## 2019-10-22 NOTE — Telephone Encounter (Signed)
Pt called demanding an in person appointment told pt I could not confirm it would be in person then the pt stated she wanted the provider to call her. Pt stated she was never informed that last visit was a tele health I informed the pt that I call every day for the next day appointments pt refused to make appointment until she heard from provider

## 2019-10-22 NOTE — Telephone Encounter (Signed)
Will await for provider recommendation.  Last appointment was scheduled as a telehealth appointment because patient was having c/o nausea & vomiting.

## 2019-11-23 NOTE — Progress Notes (Signed)
NEUROLOGY CONSULTATION NOTE  Erin Ruiz MRN: [id number] DOB: 18-Oct-1990  Referring provider: Marcy Siren, DO Primary care provider: Marcy Siren, DO  Reason for consult:  headaches  HISTORY OF PRESENT ILLNESS: Erin Ruiz is a 29 year old right-handed female with depression and anxiety who presents for headaches.  History supplemented by ED and referring provider's notes.  She has had history of headaches since high school.  She was diagnosed with tension-type headaches.  They are dull to moderate non throbbing headache over the forehead/temples and behind the eyes.  Sometimes they may involve the back of head and neck.  No associated visual disturbance, nausea, vomiting, photophobia or phonophobia.  They typically last a couple of hours or all day.  They are aggravated by change in weather.  She has treated with Tylenol, ibuprofen, or antihistamines.  She has them 4 out of 7 days a week.  On average, she takes an analgesic about almost daily.  She started getting new severe headaches out of sleep about 6 months ago.  It is a severe holocephalic pounding and/or sharp pain.  They are associated with nausea, vomiting, dizziness (lightheadedness but feels off-balance or tilting on her feet), photophobia, phonophobia or may see little moving dots in her vision.  She treats with tylenol or ibuprofen.  The severe headache lasts about a couple of hours after treatment (she wakes up at 4 AM, take analgesic and then fall back asleep).  For the rest of that day, she has a dull headache.  They initially occurred once a month, but she has had 3 over the last 4 weeks.  She went to the ED in July where she was diagnosed with vertigo.  She was discharged with meclizine and Zofran.  No specific trigger.  She also reports right sided neck pain.  Sometimes she can feel her heartbeat in the neck.  She did have a CT of head and cervical spine performed on 06/10/2017 following a fall, which was  personally reviewed and showed straightening of the normal cervical lordosis but otherwise unremarkable with no acute intracranial or cervical spinal abnormality.  Current NSAIDS:  ibuprofen Current analgesics:  acetaminophen Current triptans:  none Current ergotamine:  none Current anti-emetic:  Zofran ODT 4mg  Current muscle relaxants:  none Current anti-anxiolytic:  none Current sleep aide:  none Current Antihypertensive medications:  none Current Antidepressant medications:  escitalopram 20mg  Current Anticonvulsant medications:  none Current anti-CGRP:  none Current Vitamins/Herbal/Supplements:  Biotin Current Antihistamines/Decongestants:  meclizine Other therapy:  none Hormone/birth control:  Loestrin 24 FE Other medications:  none  Past NSAIDS: none Past analgesics:  Fioricet, Excedrin Migraine Past abortive triptans:  Sumatriptan 50mg  Past abortive ergotamine:  none Past muscle relaxants:  Flexeril, tizanidine Past anti-emetic:  Reglan 5mg  Past antihypertensive medications:  labetolol Past antidepressant medications:  Sertraline 100mg  Past anticonvulsant medications:  topiramate 50mg  BID Past anti-CGRP:  none Past vitamins/Herbal/Supplements:  none Past antihistamines/decongestants:  Zyrtec Other past therapies:  none  Caffeine:  1 to 2 cups of coffee daily.  Occasional tea.  No soda. Diet:  8 bottles of water daily.  Does not skip meals. Exercise:  She was going to the gym off and on. Depression:  yes; Anxiety:  yes Other pain:  none Sleep hygiene:  Other than waking up with the headache, she sleeps well.   Family history of headache:  No   PAST MEDICAL HISTORY: Past Medical History:  Diagnosis Date  . Depression   . Encounter for supervision of other normal  pregnancy in first trimester 12/02/2014  . Heart murmur    childhood  . Preeclampsia 06/01/2015    PAST SURGICAL HISTORY: Past Surgical History:  Procedure Laterality Date  . TONSILLECTOMY  2004  .  WISDOM TOOTH EXTRACTION      MEDICATIONS: Current Outpatient Medications on File Prior to Visit  Medication Sig Dispense Refill  . BIOTIN MAXIMUM PO Take 1 tablet by mouth daily.    [name] escitalopram (LEXAPRO) 20 MG tablet Take 1 tablet (20 mg total) by mouth daily. 90 tablet 0  . meclizine (ANTIVERT) 12.5 MG tablet Take 1 tablet (12.5 mg total) by mouth 3 (three) times daily as needed for dizziness. 30 tablet 0  . Norethindrone Acetate-Ethinyl Estrad-FE (LOESTRIN 24 FE) 1-20 MG-MCG(24) tablet Take 1 tablet by mouth daily. 1 Package 11  . ondansetron (ZOFRAN ODT) 4 MG disintegrating tablet Take 1 tablet (4 mg total) by mouth every 8 (eight) hours as needed for nausea or vomiting. 20 tablet 0   No current facility-administered medications on file prior to visit.    ALLERGIES: No Known Allergies  FAMILY HISTORY: Family History  Problem Relation Age of Onset  . Hypertension Mother   . Asthma Father     SOCIAL HISTORY: Social History   Socioeconomic History  . Marital status: Married    Spouse name: Not on file  . Number of children: Not on file  . Years of education: Not on file  . Highest education level: Not on file  Occupational History  . Not on file  Tobacco Use  . Smoking status: Former Games developer  . Smokeless tobacco: Never Used  Vaping Use  . Vaping Use: Never used  Substance and Sexual Activity  . Alcohol use: No  . Drug use: No  . Sexual activity: Yes    Partners: Male    Birth control/protection: Pill  Other Topics Concern  . Not on file  Social History Narrative  . Not on file   Social Determinants of Health   Financial Resource Strain:   . Difficulty of Paying Living Expenses: Not on file  Food Insecurity:   . Worried About Programme researcher, broadcasting/film/video in the Last Year: Not on file  . Ran Out of Food in the Last Year: Not on file  Transportation Needs:   . Lack of Transportation (Medical): Not on file  . Lack of Transportation (Non-Medical): Not on file    Physical Activity:   . Days of Exercise per Week: Not on file  . Minutes of Exercise per Session: Not on file  Stress:   . Feeling of Stress : Not on file  Social Connections:   . Frequency of Communication with Friends and Family: Not on file  . Frequency of Social Gatherings with Friends and Family: Not on file  . Attends Religious Services: Not on file  . Active Member of Clubs or Organizations: Not on file  . Attends Banker Meetings: Not on file  . Marital Status: Not on file  Intimate Partner Violence:   . Fear of Current or Ex-Partner: Not on file  . Emotionally Abused: Not on file  . Physically Abused: Not on file  . Sexually Abused: Not on file    PHYSICAL EXAM: Blood pressure 130/79, pulse 84, height 5\' 4"  (1.626 m), weight 221 lb (100.2 kg), SpO2 97 %. General: No acute distress.  Patient appears well-groomed.  Head:  Normocephalic/atraumatic Eyes:  fundi examined but not visualized Neck: supple, no paraspinal tenderness, full range  of motion Back: No paraspinal tenderness Heart: regular rate and rhythm Lungs: Clear to auscultation bilaterally. Vascular: No carotid bruits. Neurological Exam: Mental status: alert and oriented to person, place, and time, recent and remote memory intact, fund of knowledge intact, attention and concentration intact, speech fluent and not dysarthric, language intact. Cranial nerves: CN I: not tested CN II: pupils equal, round and reactive to light, visual fields intact CN III, IV, VI:  full range of motion, no nystagmus, no ptosis CN V: facial sensation intact CN VII: upper and lower face symmetric CN VIII: hearing intact CN IX, X: gag intact, uvula midline CN XI: sternocleidomastoid and trapezius muscles intact CN XII: tongue midline Bulk & Tone: normal, no fasciculations. Motor:  5/5 throughout  Sensation:  Pinprick and vibration sensation intact.   Deep Tendon Reflexes:  2+ throughout, toes downgoing.   Finger to  nose testing:  Without dysmetria.   Heel to shin:  Without dysmetria.   Gait:  Normal station and stride.  Able to turn and tandem walk. Romberg negative.  IMPRESSION: 1.  New severe headaches that wake her up at night.  Consider primary thunderclap headache, may still be migraine.  Hypnic headache less likely given her age and severity.  Based on semiology and the fact that these are new headaches, secondary etiologies must be ruled out. 2.  Chronic tension-type headache vs medication-overuse headache.  PLAN: 1.  MRI of brain without contrast to evaluate for intracranial abnormality 2.  MRA of head and neck to evaluate for cerebral aneurysm or arterial dissection. 3.  Start topiramate 25mg  at bedtime for one week, then increase to 50mg  at bedtime.  We can increase dose to 75mg  at bedtime in 6 weeks if needed.  Alternatively, consider verapamil if topiramate ineffective. 4.  Limit use of pain relievers to no more than 2 days out of week to prevent risk of rebound or medication-overuse headache. 5.  Follow up in 4 to 6 months.  Thank you for allowing me to take part in the care of this patient.  , DO  CC: , DO

## 2019-11-24 ENCOUNTER — Ambulatory Visit: Payer: Medicaid Other

## 2019-11-24 ENCOUNTER — Encounter: Payer: Self-pay | Admitting: Neurology

## 2019-11-24 ENCOUNTER — Ambulatory Visit (INDEPENDENT_AMBULATORY_CARE_PROVIDER_SITE_OTHER): Payer: Medicaid Other | Admitting: Neurology

## 2019-11-24 ENCOUNTER — Other Ambulatory Visit: Payer: Self-pay

## 2019-11-24 VITALS — BP 130/79 | HR 84 | Ht 64.0 in | Wt 221.0 lb

## 2019-11-24 DIAGNOSIS — I7774 Dissection of vertebral artery: Secondary | ICD-10-CM

## 2019-11-24 DIAGNOSIS — G4440 Drug-induced headache, not elsewhere classified, not intractable: Secondary | ICD-10-CM

## 2019-11-24 DIAGNOSIS — I671 Cerebral aneurysm, nonruptured: Secondary | ICD-10-CM

## 2019-11-24 DIAGNOSIS — G4453 Primary thunderclap headache: Secondary | ICD-10-CM | POA: Diagnosis not present

## 2019-11-24 DIAGNOSIS — M542 Cervicalgia: Secondary | ICD-10-CM

## 2019-11-24 MED ORDER — TOPIRAMATE 50 MG PO TABS: 50.0000 mg | ORAL_TABLET | Freq: Every day | ORAL | 5 refills | Status: DC

## 2019-11-24 NOTE — Patient Instructions (Addendum)
1.  Will check MRI of brain without contrast 2.  Will check MRA of head and neck 3.  Start topiramate 50mg  tablet:  Take 1/2 tablet at bedtime for one week, then 1 tablet at bedtime.  If headaches not improved in 6 weeks, contact me. 4.  Limit use of pain relievers to no more than 2 days out of week to prevent risk of rebound or medication-overuse headache. 5.  Follow up in 4 to 6 months.

## 2019-11-26 ENCOUNTER — Telehealth: Payer: Medicaid Other | Admitting: Emergency Medicine

## 2019-11-26 DIAGNOSIS — K0889 Other specified disorders of teeth and supporting structures: Secondary | ICD-10-CM

## 2019-11-26 MED ORDER — PENICILLIN V POTASSIUM 500 MG PO TABS: 500.0000 mg | ORAL_TABLET | Freq: Four times a day (QID) | ORAL | 0 refills | Status: DC

## 2019-11-26 NOTE — Progress Notes (Signed)
E-Visit for Dental Pain  We are sorry that you are not feeling well.  Here is how we plan to help!  Based on what you have shared with me in the questionnaire, it sounds like you have a developing dental infection.  PCN 500mg  4 times per day for 10 days  It is imperative that you see a dentist within 10 days of this eVisit to determine the cause of the dental pain and be sure it is adequately treated  A toothache or tooth pain is caused when the nerve in the root of a tooth or surrounding a tooth is irritated. Dental (tooth) infection, decay, injury, or loss of a tooth are the most common causes of dental pain. Pain may also occur after an extraction (tooth is pulled out). Pain sometimes originates from other areas and radiates to the jaw, thus appearing to be tooth pain.Bacteria growing inside your mouth can contribute to gum disease and dental decay, both of which can cause pain. A toothache occurs from inflammation of the central portion of the tooth called pulp. The pulp contains nerve endings that are very sensitive to pain. Inflammation to the pulp or pulpitis may be caused by dental cavities, trauma, and infection.    HOME CARE:   For toothaches: . Over-the-counter pain medications such as acetaminophen or ibuprofen may be used. Take these as directed on the package while you arrange for a dental appointment. . Avoid very cold or hot foods, because they may make the pain worse. . You may get relief from biting on a cotton ball soaked in oil of cloves. You can get oil of cloves at most drug stores.  For jaw pain: .  Aspirin may be helpful for problems in the joint of the jaw in adults. . If pain happens every time you open your mouth widely, the temporomandibular joint (TMJ) may be the source of the pain. Yawning or taking a large bite of food may worsen the pain. An appointment with your doctor or dentist will help you find the cause.     GET HELP RIGHT AWAY IF:  . You have a high  fever or chills . If you have had a recent head or face injury and develop headache, light headedness, nausea, vomiting, or other symptoms that concern you after an injury to your face or mouth, you could have a more serious injury in addition to your dental injury. . A facial rash associated with a toothache: This condition may improve with medication. Contact your doctor for them to decide what is appropriate. . Any jaw pain occurring with chest pain: Although jaw pain is most commonly caused by dental disease, it is sometimes referred pain from other areas. People with heart disease, especially people who have had stents placed, people with diabetes, or those who have had heart surgery may have jaw pain as a symptom of heart attack or angina. If your jaw or tooth pain is associated with lightheadedness, sweating, or shortness of breath, you should see a doctor as soon as possible. . Trouble swallowing or excessive pain or bleeding from gums: If you have a history of a weakened immune system, diabetes, or steroid use, you may be more susceptible to infections. Infections can often be more severe and extensive or caused by unusual organisms. Dental and gum infections in people with these conditions may require more aggressive treatment. An abscess may need draining or IV antibiotics, for example.  MAKE SURE YOU    Understand these  instructions.  Will watch your condition.  Will get help right away if you are not doing well or get worse.  Thank you for choosing an e-visit. Your e-visit answers were reviewed by a board certified advanced clinical practitioner to complete your personal care plan. Depending upon the condition, your plan could have included both over the counter or prescription medications. Please review your pharmacy choice. Make sure the pharmacy is open so you can pick up prescription now. If there is a problem, you may contact your provider through [employer] and have the  prescription routed to another pharmacy. Your safety is important to [place]. If you have drug allergies check your prescription carefully.  For the next 24 hours you can use MyChart to ask questions about today's visit, request a non-urgent call back, or ask for a work or school excuse. You will get an email in the next two days asking about your experience. I hope that your e-visit has been valuable and will speed your recovery.  Approximately 5 minutes was used in reviewing the patient's chart, questionnaire, prescribing medications, and documentation.

## 2019-11-27 ENCOUNTER — Other Ambulatory Visit: Payer: Self-pay

## 2019-11-27 DIAGNOSIS — F419 Anxiety disorder, unspecified: Secondary | ICD-10-CM

## 2019-11-27 DIAGNOSIS — F32A Depression, unspecified: Secondary | ICD-10-CM

## 2019-11-27 MED ORDER — ESCITALOPRAM OXALATE 20 MG PO TABS: 20.0000 mg | ORAL_TABLET | Freq: Every day | ORAL | 0 refills | Status: DC

## 2019-12-10 ENCOUNTER — Other Ambulatory Visit: Payer: Self-pay | Admitting: Neurology

## 2019-12-10 MED ORDER — DIAZEPAM 5 MG PO TABS: ORAL_TABLET | ORAL | 0 refills | Status: DC

## 2019-12-13 ENCOUNTER — Ambulatory Visit
Admission: RE | Admit: 2019-12-13 | Discharge: 2019-12-13 | Disposition: A | Discharge status: Home / Self Care | Payer: Medicaid Other | Source: Ambulatory Visit | Attending: Neurology | Admitting: Neurology

## 2019-12-13 DIAGNOSIS — I671 Cerebral aneurysm, nonruptured: Secondary | ICD-10-CM

## 2019-12-13 DIAGNOSIS — G4453 Primary thunderclap headache: Secondary | ICD-10-CM

## 2019-12-13 DIAGNOSIS — I7774 Dissection of vertebral artery: Secondary | ICD-10-CM

## 2019-12-13 DIAGNOSIS — M542 Cervicalgia: Secondary | ICD-10-CM

## 2019-12-13 MED ORDER — GADOBENATE DIMEGLUMINE 529 MG/ML IV SOLN
20.0000 mL | Freq: Once | INTRAVENOUS | Status: AC | PRN
  Administered 2019-12-13: 20 mL via INTRAVENOUS

## 2020-02-24 ENCOUNTER — Ambulatory Visit: Payer: Medicaid Other

## 2020-02-29 ENCOUNTER — Ambulatory Visit: Payer: Medicaid Other

## 2020-04-11 NOTE — Progress Notes (Deleted)
NEUROLOGY FOLLOW UP OFFICE NOTE  Erin Ruiz [id number]   Subjective:  Erin Ruiz is a 30 year old right-handed female with depression and anxiety who follows up for thunderclap headache.  UPDATE: MRI of brain and MRA of head and neck on 12/14/2019 personally reviewed were normal.  She was started on topiramate in September.   Intensity:  *** Duration:  *** Frequency:  *** Frequency of abortive medication: *** Current NSAIDS:  ibuprofen Current analgesics:  acetaminophen Current triptans:  none Current ergotamine:  none Current anti-emetic:  Zofran ODT 4mg  Current muscle relaxants:  none Current anti-anxiolytic:  none Current sleep aide:  none Current Antihypertensive medications:  none Current Antidepressant medications:  escitalopram 20mg  Current Anticonvulsant medications:  topiramate 50mg  at bedtime Current anti-CGRP:  none Current Vitamins/Herbal/Supplements:  Biotin Current Antihistamines/Decongestants:  meclizine Other therapy:  none Hormone/birth control:  Loestrin 24 FE Other medications:  none  Caffeine:  1 to 2 cups of coffee daily.  Occasional tea.  No soda. Diet:  8 bottles of water daily.  Does not skip meals. Exercise:  She was going to the gym off and on. Depression:  yes; Anxiety:  yes Other pain:  none Sleep hygiene:  Other than waking up with the headache, she sleeps well.   HISTORY: She has had history of headaches since high school.  She was diagnosed with tension-type headaches.  They are dull to moderate non throbbing headache over the forehead/temples and behind the eyes.  Sometimes they may involve the back of head and neck.  No associated visual disturbance, nausea, vomiting, photophobia or phonophobia.  They typically last a couple of hours or all day.  They are aggravated by change in weather.  She has treated with Tylenol, ibuprofen, or antihistamines.  She has them 4 out of 7 days a week.  On average, she takes an analgesic about  almost daily.  She started getting new severe headaches out of sleep about 6 months ago.  It is a severe holocephalic pounding and/or sharp pain.  They are associated with nausea, vomiting, dizziness (lightheadedness but feels off-balance or tilting on her feet), photophobia, phonophobia or may see little moving dots in her vision.  She treats with tylenol or ibuprofen.  The severe headache lasts about a couple of hours after treatment (she wakes up at 4 AM, take analgesic and then fall back asleep).  For the rest of that day, she has a dull headache.  They initially occurred once a month, but she has had 3 over the last 4 weeks.  She went to the ED in July where she was diagnosed with vertigo.  She was discharged with meclizine and Zofran.  No specific trigger.  She also reports right sided neck pain.  Sometimes she can feel her heartbeat in the neck.  She did have a CT of head and cervical spine performed on 06/10/2017 following a fall, which was personally reviewed and showed straightening of the normal cervical lordosis but otherwise unremarkable with no acute intracranial or cervical spinal abnormality.    Past NSAIDS: none Past analgesics:  Fioricet, Excedrin Migraine Past abortive triptans:  Sumatriptan 50mg  Past abortive ergotamine:  none Past muscle relaxants:  Flexeril, tizanidine Past anti-emetic:  Reglan 5mg  Past antihypertensive medications:  labetolol Past antidepressant medications:  Sertraline 100mg  Past anticonvulsant medications:  topiramate 50mg  BID Past anti-CGRP:  none Past vitamins/Herbal/Supplements:  none Past antihistamines/decongestants:  Zyrtec Other past therapies:  none    Family history of headache:  No  PAST  MEDICAL HISTORY: Past Medical History:  Diagnosis Date  . Depression   . Encounter for supervision of other normal pregnancy in first trimester 12/02/2014  . Heart murmur    childhood  . Preeclampsia 06/01/2015    MEDICATIONS: Current Outpatient  Medications on File Prior to Visit  Medication Sig Dispense Refill  . BIOTIN MAXIMUM PO Take 1 tablet by mouth daily.    . diazepam (VALIUM) 5 MG tablet Take 1 tablet 40 to 60 minutes prior to MRI 1 tablet 0  . escitalopram (LEXAPRO) 20 MG tablet Take 1 tablet (20 mg total) by mouth daily. 90 tablet 0  . meclizine (ANTIVERT) 12.5 MG tablet Take 1 tablet (12.5 mg total) by mouth 3 (three) times daily as needed for dizziness. 30 tablet 0  . Norethindrone Acetate-Ethinyl Estrad-FE (LOESTRIN 24 FE) 1-20 MG-MCG(24) tablet Take 1 tablet by mouth daily. 1 Package 11  . ondansetron (ZOFRAN ODT) 4 MG disintegrating tablet Take 1 tablet (4 mg total) by mouth every 8 (eight) hours as needed for nausea or vomiting. 20 tablet 0  . penicillin v potassium (VEETID) 500 MG tablet Take 1 tablet (500 mg total) by mouth 4 (four) times daily. 40 tablet 0  . topiramate (TOPAMAX) 50 MG tablet Take 1 tablet (50 mg total) by mouth at bedtime. 30 tablet 5   No current facility-administered medications on file prior to visit.    ALLERGIES: No Known Allergies  FAMILY HISTORY: Family History  Problem Relation Age of Onset  . Hypertension Mother   . Asthma Father     SOCIAL HISTORY: Social History   Socioeconomic History  . Marital status: Married    Spouse name: Not on file  . Number of children: Not on file  . Years of education: Not on file  . Highest education level: Not on file  Occupational History  . Not on file  Tobacco Use  . Smoking status: Former Games developer  . Smokeless tobacco: Never Used  Vaping Use  . Vaping Use: Never used  Substance and Sexual Activity  . Alcohol use: No  . Drug use: No  . Sexual activity: Yes    Partners: Male    Birth control/protection: Pill  Other Topics Concern  . Not on file  Social History Narrative   Right handed   Lives with husband in one story home.   Social Determinants of Health   Financial Resource Strain: Not on file  Food Insecurity: Not on file   Transportation Needs: Not on file  Physical Activity: Not on file  Stress: Not on file  Social Connections: Not on file  Intimate Partner Violence: Not on file     Objective:  *** General: No acute distress.  Patient appears well-groomed.   Head:  Normocephalic/atraumatic Eyes:  Fundi examined but not visualized Neck: supple, no paraspinal tenderness, full range of motion Heart:  Regular rate and rhythm Lungs:  Clear to auscultation bilaterally Back: No paraspinal tenderness Neurological Exam: alert and oriented to person, place, and time. Attention span and concentration intact, recent and remote memory intact, fund of knowledge intact.  Speech fluent and not dysarthric, language intact.  CN II-XII intact. Bulk and tone normal, muscle strength 5/5 throughout.  Sensation to light touch, temperature and vibration intact.  Deep tendon reflexes 2+ throughout, toes downgoing.  Finger to nose and heel to shin testing intact.  Gait normal, Romberg negative.   Assessment/Plan:   Thunderclap headache.  Imaging negative for an intracranial abnormality, aneurysm or other vascular  abnormality.    ***  Shon Millet, DO  CC: Arvilla Market, DO

## 2020-04-12 ENCOUNTER — Ambulatory Visit: Payer: Medicaid Other | Admitting: Neurology

## 2020-04-20 ENCOUNTER — Other Ambulatory Visit: Payer: Self-pay

## 2020-04-20 DIAGNOSIS — F419 Anxiety disorder, unspecified: Secondary | ICD-10-CM

## 2020-04-20 DIAGNOSIS — F32A Depression, unspecified: Secondary | ICD-10-CM

## 2020-05-02 ENCOUNTER — Other Ambulatory Visit: Payer: Self-pay | Admitting: *Deleted

## 2020-05-02 ENCOUNTER — Telehealth: Payer: Self-pay | Admitting: *Deleted

## 2020-05-02 MED ORDER — BLISOVI 24 FE 1-20 MG-MCG(24) PO TABS: 1.0000 | ORAL_TABLET | Freq: Every day | ORAL | 11 refills | Status: DC

## 2020-05-02 NOTE — Telephone Encounter (Signed)
Fax received from [facility] for refill on Blisovi. Currently has Loestrin on med profile.    Please send new Rx for Blisovi if approved.  Send to [facility], [place] [place].

## 2020-05-02 NOTE — Progress Notes (Signed)
Refill on BC pills sent to pharmacy per provider approval.

## 2020-05-02 NOTE — Telephone Encounter (Signed)
Blisovi refill may be sent, which is the Brand name

## 2020-05-13 ENCOUNTER — Other Ambulatory Visit (HOSPITAL_COMMUNITY): Payer: Self-pay | Admitting: Internal Medicine

## 2020-05-13 ENCOUNTER — Other Ambulatory Visit: Payer: Self-pay

## 2020-05-13 DIAGNOSIS — F32A Depression, unspecified: Secondary | ICD-10-CM

## 2020-05-13 DIAGNOSIS — F419 Anxiety disorder, unspecified: Secondary | ICD-10-CM

## 2020-05-13 MED ORDER — ESCITALOPRAM OXALATE 20 MG PO TABS: 20.0000 mg | ORAL_TABLET | Freq: Every day | ORAL | 0 refills | Status: DC

## 2020-05-13 MED FILL — ESCITALOPRAM 20 MG TABLET: 20 | 30 days supply | Qty: 30 | Fill #0

## 2020-05-13 NOTE — Progress Notes (Signed)
30 day supply Lexapro refilled. Future refills require appt

## 2020-08-18 ENCOUNTER — Ambulatory Visit: Payer: Medicaid Other | Admitting: Neurology

## 2020-08-30 ENCOUNTER — Encounter (INDEPENDENT_AMBULATORY_CARE_PROVIDER_SITE_OTHER): Payer: Self-pay | Admitting: Primary Care

## 2020-08-30 ENCOUNTER — Ambulatory Visit (INDEPENDENT_AMBULATORY_CARE_PROVIDER_SITE_OTHER): Payer: Medicaid Other | Admitting: Primary Care

## 2020-08-30 ENCOUNTER — Other Ambulatory Visit: Payer: Self-pay

## 2020-08-30 VITALS — BP 117/70 | HR 77 | Temp 97.3°F | Resp 16 | Ht 64.0 in | Wt 206.0 lb

## 2020-08-30 DIAGNOSIS — F331 Major depressive disorder, recurrent, moderate: Secondary | ICD-10-CM

## 2020-08-30 DIAGNOSIS — F419 Anxiety disorder, unspecified: Secondary | ICD-10-CM | POA: Diagnosis not present

## 2020-08-30 DIAGNOSIS — E6609 Other obesity due to excess calories: Secondary | ICD-10-CM

## 2020-08-30 DIAGNOSIS — F32A Depression, unspecified: Secondary | ICD-10-CM | POA: Diagnosis not present

## 2020-08-30 DIAGNOSIS — Z833 Family history of diabetes mellitus: Secondary | ICD-10-CM | POA: Diagnosis not present

## 2020-08-30 DIAGNOSIS — Z7689 Persons encountering health services in other specified circumstances: Secondary | ICD-10-CM

## 2020-08-30 DIAGNOSIS — Z6835 Body mass index (BMI) 35.0-35.9, adult: Secondary | ICD-10-CM

## 2020-08-30 LAB — POCT GLYCOSYLATED HEMOGLOBIN (HGB A1C): Hemoglobin A1C: 5.2 % (ref 4.0–5.6)

## 2020-08-30 MED ORDER — BUPROPION HCL ER (SR) 150 MG PO TB12: 150.0000 mg | ORAL_TABLET | Freq: Two times a day (BID) | ORAL | 0 refills | Status: DC

## 2020-08-30 MED ORDER — ESCITALOPRAM OXALATE 20 MG PO TABS: 20.0000 mg | ORAL_TABLET | Freq: Every day | ORAL | 1 refills | Status: DC

## 2020-08-30 NOTE — Patient Instructions (Addendum)
Calorie Counting for Weight Loss Calories are units of energy. Your body needs a certain number of calories from food to keep going throughout the day. When you eat or drink more calories than your body needs, your body stores the extra calories mostly as fat. When you eat or drink fewer calories than your body needs, your body burns fat to getthe energy it needs. Calorie counting means keeping track of how many calories you eat and drink each day. Calorie counting can be helpful if you need to lose weight. If you eat fewer calories than your body needs, you should lose weight. Ask yourhealth care provider what a healthy weight is for you. For calorie counting to work, you will need to eat the right number of calories each day to lose a healthy amount of weight per week. A dietitian can help you figure out how many calories you need in a day and will suggest ways to reach your calorie goal. A healthy amount of weight to lose each week is usually 1-2 lb (0.5-0.9 kg). This usually means that your daily calorie intake should be reduced by 500-750 calories. Eating 1,200-1,500 calories a day can help most women lose weight. Eating 1,500-1,800 calories a day can help most men lose weight. What do I need to know about calorie counting? Work with your health care provider or dietitian to determine how many calories you should get each day. To meet your daily calorie goal, you will need to: Find out how many calories are in each food that you would like to eat. Try to do this before you eat. Decide how much of the food you plan to eat. Keep a food log. Do this by writing down what you ate and how many calories it had. To successfully lose weight, it is important to balance calorie counting with ahealthy lifestyle that includes regular activity. Where do I find calorie information?  The number of calories in a food can be found on a Nutrition Facts label. If a food does not have a Nutrition Facts label, try to  look up the calories onlineor ask your dietitian for help. Remember that calories are listed per serving. If you choose to have more than one serving of a food, you will have to multiply the calories per serving by the number of servings you plan to eat. For example, the label on a package of bread might say that a serving size is 1 slice and that there are 90 calories in a serving. If you eat 1 slice, you will have eaten 90 calories. If you eat 2slices, you will have eaten 180 calories. How do I keep a food log? After each time that you eat, record the following in your food log as soon as possible: What you ate. Be sure to include toppings, sauces, and other extras on the food. How much you ate. This can be measured in cups, ounces, or number of items. How many calories were in each food and drink. The total number of calories in the food you ate. Keep your food log near you, such as in a pocket-sized notebook or on an app or website on your mobile phone. Some programs will calculate calories for you andshow you how many calories you have left to meet your daily goal. What are some portion-control tips? Know how many calories are in a serving. This will help you know how many servings you can have of a certain food. Use a measuring cup to   measure serving sizes. You could also try weighing out portions on a kitchen scale. With time, you will be able to estimate serving sizes for some foods. Take time to put servings of different foods on your favorite plates or in your favorite bowls and cups so you know what a serving looks like. Try not to eat straight from a food's packaging, such as from a bag or box. Eating straight from the package makes it hard to see how much you are eating and can lead to overeating. Put the amount you would like to eat in a cup or on a plate to make sure you are eating the right portion. Use smaller plates, glasses, and bowls for smaller portions and to prevent  overeating. Try not to multitask. For example, avoid watching TV or using your computer while eating. If it is time to eat, sit down at a table and enjoy your food. This will help you recognize when you are full. It will also help you be more mindful of what and how much you are eating. What are tips for following this plan? Reading food labels Check the calorie count compared with the serving size. The serving size may be smaller than what you are used to eating. Check the source of the calories. Try to choose foods that are high in protein, fiber, and vitamins, and low in saturated fat, trans fat, and sodium. Shopping Read nutrition labels while you shop. This will help you make healthy decisions about which foods to buy. Pay attention to nutrition labels for low-fat or fat-free foods. These foods sometimes have the same number of calories or more calories than the full-fat versions. They also often have added sugar, starch, or salt to make up for flavor that was removed with the fat. Make a grocery list of lower-calorie foods and stick to it. Cooking Try to cook your favorite foods in a healthier way. For example, try baking instead of frying. Use low-fat dairy products. Meal planning Use more fruits and vegetables. One-half of your plate should be fruits and vegetables. Include lean proteins, such as chicken, turkey, and fish. Lifestyle Each week, aim to do one of the following: 150 minutes of moderate exercise, such as walking. 75 minutes of vigorous exercise, such as running. General information Know how many calories are in the foods you eat most often. This will help you calculate calorie counts faster. Find a way of tracking calories that works for you. Get creative. Try different apps or programs if writing down calories does not work for you. What foods should I eat?  Eat nutritious foods. It is better to have a nutritious, high-calorie food, such as an avocado, than a food with  few nutrients, such as a bag of potato chips. Use your calories on foods and drinks that will fill you up and will not leave you hungry soon after eating. Examples of foods that fill you up are nuts and nut butters, vegetables, lean proteins, and high-fiber foods such as whole grains. High-fiber foods are foods with more than 5 g of fiber per serving. Pay attention to calories in drinks. Low-calorie drinks include water and unsweetened drinks. The items listed above may not be a complete list of foods and beverages you can eat. Contact a dietitian for more information. What foods should I limit? Limit foods or drinks that are not good sources of vitamins, minerals, or protein or that are high in unhealthy fats. These include: Candy. Other sweets. Sodas, specialty   coffee drinks, alcohol, and juice. The items listed above may not be a complete list of foods and beverages you should avoid. Contact a dietitian for more information. How do I count calories when eating out? Pay attention to portions. Often, portions are much larger when eating out. Try these tips to keep portions smaller: Consider sharing a meal instead of getting your own. If you get your own meal, eat only half of it. Before you start eating, ask for a container and put half of your meal into it. When available, consider ordering smaller portions from the menu instead of full portions. Pay attention to your food and drink choices. Knowing the way food is cooked and what is included with the meal can help you eat fewer calories. If calories are listed on the menu, choose the lower-calorie options. Choose dishes that include vegetables, fruits, whole grains, low-fat dairy products, and lean proteins. Choose items that are boiled, broiled, grilled, or steamed. Avoid items that are buttered, battered, fried, or served with cream sauce. Items labeled as crispy are usually fried, unless stated otherwise. Choose water, low-fat milk,  unsweetened iced tea, or other drinks without added sugar. If you want an alcoholic beverage, choose a lower-calorie option, such as a glass of wine or light beer. Ask for dressings, sauces, and syrups on the side. These are usually high in calories, so you should limit the amount you eat. If you want a salad, choose a garden salad and ask for grilled meats. Avoid extra toppings such as bacon, cheese, or fried items. Ask for the dressing on the side, or ask for olive oil and vinegar or lemon to use as dressing. Estimate how many servings of a food you are given. Knowing serving sizes will help you be aware of how much food you are eating at restaurants. Where to find more information Centers for Disease Control and Prevention: FootballExhibition.com.br [place]. Department of Agriculture: WrestlingReporter.dk Summary Calorie counting means keeping track of how many calories you eat and drink each day. If you eat fewer calories than your body needs, you should lose weight. A healthy amount of weight to lose per week is usually 1-2 lb (0.5-0.9 kg). This usually means reducing your daily calorie intake by 500-750 calories. The number of calories in a food can be found on a Nutrition Facts label. If a food does not have a Nutrition Facts label, try to look up the calories online or ask your dietitian for help. Use smaller plates, glasses, and bowls for smaller portions and to prevent overeating. Use your calories on foods and drinks that will fill you up and not leave you hungry shortly after a meal. This information is not intended to replace advice given to you by your health care provider. Make sure you discuss any questions you have with your healthcare provider. Document Revised: 04/02/2019 Document Reviewed: 04/02/2019 Elsevier Patient Education  2022 Elsevier Inc. Bupropion Extended-Release Tablets (Depression/Mood Disorders) What is this medication? BUPROPION (byoo PROE pee on) treats depression. It increases  norepinephrine and dopamine in the brain, hormones that help regulate mood. It belongs to a groupof medications called NDRIs. This medicine may be used for other purposes; ask your health care provider orpharmacist if you have questions. COMMON BRAND NAME(S): Aplenzin, Budeprion XL, Forfivo XL, Wellbutrin XL What should I tell my care team before I take this medication? They need to know if you have any of these conditions: An eating disorder, such as anorexia or bulimia Bipolar disorder or psychosis  Diabetes or high blood sugar, treated with medication Glaucoma Head injury or brain tumor Heart disease, previous heart attack, or irregular heart beat High blood pressure Kidney or liver disease Seizures (convulsions) Suicidal thoughts or a previous suicide attempt Tourette's syndrome Weight loss An unusual or allergic reaction to bupropion, other medications, foods, dyes, or preservatives Pregnant or trying to become pregnant Breast-feeding How should I use this medication? Take this medication by mouth with a glass of water. Follow the directions on the prescription label. You can take it with or without food. If it upsets your stomach, take it with food. Do not crush, chew, or cut these tablets. This medication is taken once daily at the same time each day. Do not take your medication more often than directed. Do not stop taking this medication suddenly except upon the advice of your care team. Stopping this medication tooquickly may cause serious side effects or your condition may worsen. A special MedGuide will be given to you by the pharmacist with eachprescription and refill. Be sure to read this information carefully each time. Talk to your care team about the use of this medication in children. Specialcare may be needed. Overdosage: If you think you have taken too much of this medicine contact apoison control center or emergency room at once. NOTE: This medicine is only for you. Do not  share this medicine with others. What if I miss a dose? If you miss a dose, skip the missed dose and take your next tablet at theregular time. Do not take double or extra doses. What may interact with this medication? Do not take this medication with any of the following: Linezolid MAOIs like Azilect, Carbex, Eldepryl, Marplan, Nardil, and Parnate Methylene blue (injected into a vein) Other medications that contain bupropion like Zyban This medication may also interact with the following: Alcohol Certain medications for anxiety or sleep Certain medications for blood pressure like metoprolol, propranolol Certain medications for depression or psychotic disturbances Certain medications for HIV or AIDS like efavirenz, lopinavir, nelfinavir, ritonavir Certain medications for irregular heart beat like propafenone, flecainide Certain medications for Parkinson's disease like amantadine, levodopa Certain medications for seizures like carbamazepine, phenytoin, phenobarbital Cimetidine Clopidogrel Cyclophosphamide Digoxin Furazolidone Isoniazid Nicotine Orphenadrine Procarbazine Steroid medications like prednisone or cortisone Stimulant medications for attention disorders, weight loss, or to stay awake Tamoxifen Theophylline Thiotepa Ticlopidine Tramadol Warfarin This list may not describe all possible interactions. Give your health care provider a list of all the medicines, herbs, non-prescription drugs, or dietary supplements you use. Also tell them if you smoke, drink alcohol, or use illegaldrugs. Some items may interact with your medicine. What should I watch for while using this medication? Tell your care team if your symptoms do not get better or if they get worse. Visit your care team for regular checks on your progress. Because it may take several weeks to see the full effects of this medication, it is important tocontinue your treatment as prescribed. Watch for new or worsening  thoughts of suicide or depression. This includes sudden changes in mood, behavior, or thoughts. These changes can happen at any time but are more common in the beginning of treatment or after a change in dose. Call your care team right away if you experience these thoughts orworsening depression. Manic episodes may happen in patients with bipolar disorder who take this medication. Watch for changes in feelings or behaviors such as feeling anxious, nervous, agitated, panicky, irritable, hostile, aggressive, impulsive, severely restless, overly excited and hyperactive, or  trouble sleeping. These symptoms can happen at anytime but are more common in the beginning of treatment or after a change in dose. Call your care team right away if you notice any ofthese symptoms. This medication may cause serious skin reactions. They can happen weeks to months after starting the medication. Contact your care team right away if you notice fevers or flu-like symptoms with a rash. The rash may be red or purple and then turn into blisters or peeling of the skin. Or, you might notice a red rash with swelling of the face, lips or lymph nodes in your neck or under yourarms. Avoid drinks that contain alcohol while taking this medication. Drinking large amounts of alcohol, using sleeping or anxiety medications, or quickly stopping the use of these agents while taking this medication may increase your risk fora seizure. Do not drive or use heavy machinery until you know how this medication affectsyou. This medication can impair your ability to perform these tasks. Do not take this medication close to bedtime. It may prevent you from sleeping. Your mouth may get dry. Chewing sugarless gum or sucking hard candy, and drinking plenty of water may help. Contact your care team if the problem doesnot go away or is severe. The tablet shell for some brands of this medication does not dissolve. This is normal. The tablet shell may appear whole  in the stool. This is not a cause forconcern. What side effects may I notice from receiving this medication? Side effects that you should report to your care team as soon as possible: Allergic reactions-skin rash, itching, hives, swelling of the face, lips, tongue, or throat Increase in blood pressure Mood and behavior changes-anxiety, nervousness, confusion, hallucinations, irritability, hostility, thoughts of suicide or self-harm, worsening mood, feelings of depression Redness, blistering, peeling, or loosening of the skin, including inside the mouth Seizures Sudden eye pain or change in vision such as blurry vision, seeing halos around lights, vision loss Side effects that usually do not require medical attention (report to your careteam if they continue or are bothersome): Constipation Dizziness Dry mouth Loss of appetite Nausea Tremors or shaking Trouble sleeping This list may not describe all possible side effects. Call your doctor for medical advice about side effects. You may report side effects to FDA at1-800-FDA-1088. Where should I keep my medication? Keep out of the reach of children and pets. Store at room temperature between 15 and 30 degrees C (59 and 86 degrees F).Throw away any unused medication after the expiration date. NOTE: This sheet is a summary. It may not cover all possible information. If you have questions about this medicine, talk to your doctor, pharmacist, orhealth care provider.  2022 Elsevier/Gold Standard (2020-05-03 14:27:06)

## 2020-08-30 NOTE — Progress Notes (Signed)
New Patient Office Visit  Subjective:  Patient ID: Erin Ruiz, female    DOB: 1990-06-07  Age: 30 y.o. MRN: [id number]  CC:  Chief Complaint  Patient presents with   New Patient (Initial Visit)   Medication Refill    HPI Erin Ruiz is a 30 year old female who presents for establishment of care. She voice she has difficulty staying a sleep at night. Discussed her BMI states her weight fluctuates due to depression and she just does not care. Matrial problems.   Past Medical History:  Diagnosis Date   Depression    Encounter for supervision of other normal pregnancy in first trimester 12/02/2014   Heart murmur    childhood   Preeclampsia 06/01/2015    Past Surgical History:  Procedure Laterality Date   TONSILLECTOMY  2004   WISDOM TOOTH EXTRACTION      Family History  Problem Relation Age of Onset   Hypertension Mother    Asthma Father   Grandfather- maternal MI Sister- new dx of DM Social History   Socioeconomic History   Marital status: Married    Spouse name: Not on file   Number of children: Not on file   Years of education: Not on file   Highest education level: Not on file  Occupational History   Not on file  Tobacco Use   Smoking status: Former    Pack years: 0.00   Smokeless tobacco: Never  Vaping Use   Vaping Use: Never used  Substance and Sexual Activity   Alcohol use: No   Drug use: No   Sexual activity: Yes    Partners: Male    Birth control/protection: Pill  Other Topics Concern   Not on file  Social History Narrative   Right handed   Lives with husband in one story home.   Social Determinants of Health   Financial Resource Strain: Not on file  Food Insecurity: Not on file  Transportation Needs: Not on file  Physical Activity: Not on file  Stress: Not on file  Social Connections: Not on file  Intimate Partner Violence: Not on file    ROS Review of Systems  Constitutional:  Positive for activity change and fatigue.   Cardiovascular:  Positive for palpitations.       Questions if anxiety does not do it frequently   Neurological:  Positive for headaches.       Seen neurologist   Psychiatric/Behavioral:  The patient is nervous/anxious.        Depression  All other systems reviewed and are negative.  Objective:   Today's Vitals: BP 117/70 (BP Location: Left Arm, Patient Position: Sitting, Cuff Size: Large)   Pulse 77   Temp (!) 97.3 F (36.3 C)   Resp 16   Ht _0  (1.626 m)   Wt 206 lb (93.4 kg)   LMP 08/28/2020 (Exact Date)   SpO2 99%   BMI 35.36 kg/m   Physical Exam Vitals reviewed.  Constitutional:      Appearance: She is obese.  HENT:     Head: Normocephalic.     Right Ear: Tympanic membrane and external ear normal.     Left Ear: Tympanic membrane and external ear normal.     Nose: Nose normal.  Eyes:     Extraocular Movements: Extraocular movements intact.     Pupils: Pupils are equal, round, and reactive to light.  Cardiovascular:     Rate and Rhythm: Normal rate and regular rhythm.  Pulmonary:  Effort: Pulmonary effort is normal.     Breath sounds: Normal breath sounds.  Abdominal:     General: Bowel sounds are normal. There is distension.     Palpations: Abdomen is soft.  Musculoskeletal:        General: Normal range of motion.     Cervical back: Normal range of motion and neck supple.  Skin:    General: Skin is warm and dry.  Neurological:     Mental Status: She is alert and oriented to person, place, and time.  Psychiatric:        Behavior: Behavior normal.        Thought Content: Thought content normal.   Assessment & Plan:  Erin Ruiz was seen today for new patient (initial visit) and medication refill.  Diagnoses and all orders for this visit:  Encounter to establish care -     Cancel: Basic metabolic panel -     TGG26+RSWN  Class 2 obesity due to excess calories without serious comorbidity with body mass index (BMI) of 35.0 to 35.9 in adult Obesity is  30-39 indicating an excess in caloric intake or underlining conditions. This may lead to other co-morbidities. Lifestyle modifications of diet and exercise may reduce obesity.   -     Lipid panel -     CMP14+EGFR  Body mass index (BMI) of 35.0-35.9 in adult -     TSH  Anxiety and depression Increased anxiety with depression added Wellbutrin to increase effectiveness of SSRI -     buPROPion (WELLBUTRIN SR) 150 MG 12 hr tablet; Take 1 tablet (150 mg total) by mouth 2 (two) times daily. -     escitalopram (LEXAPRO) 20 MG tablet; Take 1 tablet (20 mg total) by mouth daily.  Family history of diabetes mellitus -     HgB A1c 5.2    Outpatient Encounter Medications as of 08/30/2020  Medication Sig   BIOTIN MAXIMUM PO Take 1 tablet by mouth daily.   buPROPion (WELLBUTRIN SR) 150 MG 12 hr tablet Take 1 tablet (150 mg total) by mouth 2 (two) times daily.   Norethindrone Acetate-Ethinyl Estrad-FE (BLISOVI 24 FE) 1-20 MG-MCG(24) tablet Take 1 tablet by mouth daily.   [DISCONTINUED] escitalopram (LEXAPRO) 20 MG tablet Take 1 tablet (20 mg total) by mouth daily.   escitalopram (LEXAPRO) 20 MG tablet Take 1 tablet (20 mg total) by mouth daily.   [DISCONTINUED] escitalopram (LEXAPRO) 20 MG tablet TAKE 1 TABLET (20 MG TOTAL) BY MOUTH DAILY.   [DISCONTINUED] meclizine (ANTIVERT) 12.5 MG tablet Take 1 tablet (12.5 mg total) by mouth 3 (three) times daily as needed for dizziness. (Patient not taking: Reported on 08/30/2020)   [DISCONTINUED] ondansetron (ZOFRAN ODT) 4 MG disintegrating tablet Take 1 tablet (4 mg total) by mouth every 8 (eight) hours as needed for nausea or vomiting.   [DISCONTINUED] penicillin v potassium (VEETID) 500 MG tablet Take 1 tablet (500 mg total) by mouth 4 (four) times daily.   [DISCONTINUED] topiramate (TOPAMAX) 50 MG tablet Take 1 tablet (50 mg total) by mouth at bedtime.   No facility-administered encounter medications on file as of 08/30/2020.     Follow-up: Return in 8 weeks  (on 10/25/2020) for first avaiable CSW.   Kerin Perna, NP

## 2020-08-30 NOTE — Progress Notes (Signed)
Establish care Medication refill

## 2020-08-31 LAB — CMP14+EGFR
ALT: 19 IU/L (ref 0–32)
AST: 18 IU/L (ref 0–40)
Albumin/Globulin Ratio: 2 (ref 1.2–2.2)
Albumin: 4.8 g/dL (ref 3.9–5.0)
Alkaline Phosphatase: 84 IU/L (ref 44–121)
BUN/Creatinine Ratio: 10 (ref 9–23)
BUN: 8 mg/dL (ref 6–20)
Bilirubin Total: 0.4 mg/dL (ref 0.0–1.2)
CO2: 24 mmol/L (ref 20–29)
Calcium: 9.7 mg/dL (ref 8.7–10.2)
Chloride: 101 mmol/L (ref 96–106)
Creatinine, Ser: 0.8 mg/dL (ref 0.57–1.00)
Globulin, Total: 2.4 g/dL (ref 1.5–4.5)
Glucose: 86 mg/dL (ref 65–99)
Potassium: 4.6 mmol/L (ref 3.5–5.2)
Sodium: 138 mmol/L (ref 134–144)
Total Protein: 7.2 g/dL (ref 6.0–8.5)
eGFR: 102 mL/min/{1.73_m2} (ref >59)

## 2020-08-31 LAB — TSH: TSH: 0.9 u[IU]/mL (ref 0.450–4.500)

## 2020-08-31 LAB — LIPID PANEL
Chol/HDL Ratio: 4.8 ratio — ABNORMAL HIGH (ref 0.0–4.4)
Cholesterol, Total: 260 mg/dL — ABNORMAL HIGH (ref 100–199)
HDL: 54 mg/dL (ref >39)
LDL Chol Calc (NIH): 180 mg/dL — ABNORMAL HIGH (ref 0–99)
Triglycerides: 142 mg/dL (ref 0–149)
VLDL Cholesterol Cal: 26 mg/dL (ref 5–40)

## 2020-09-01 ENCOUNTER — Encounter (INDEPENDENT_AMBULATORY_CARE_PROVIDER_SITE_OTHER): Payer: Self-pay | Admitting: Primary Care

## 2020-09-07 ENCOUNTER — Telehealth (INDEPENDENT_AMBULATORY_CARE_PROVIDER_SITE_OTHER): Payer: Self-pay

## 2020-09-07 NOTE — Telephone Encounter (Signed)
Copied from CRM [id number]. Topic: General - Inquiry >> Sep 06, 2020  2:48 PM Aretta Nip wrote: Reason for CRM: pt is inquiring about her lab results as she has not been contacted and states abnormal, also complaining that her MyChart message is not being returned as to forwarding her insurance information to requestors stating she has lost her cards. Pt was told that calling  the insurance company would be directive to obtain coverage information but did not seem satisfied. Want FU call [phone]

## 2020-09-08 ENCOUNTER — Encounter (INDEPENDENT_AMBULATORY_CARE_PROVIDER_SITE_OTHER): Payer: Self-pay

## 2020-09-08 ENCOUNTER — Other Ambulatory Visit (HOSPITAL_COMMUNITY): Payer: Self-pay

## 2020-09-08 ENCOUNTER — Encounter (INDEPENDENT_AMBULATORY_CARE_PROVIDER_SITE_OTHER): Payer: Medicaid Other | Admitting: Clinical

## 2020-09-08 ENCOUNTER — Other Ambulatory Visit (INDEPENDENT_AMBULATORY_CARE_PROVIDER_SITE_OTHER): Payer: Self-pay | Admitting: Primary Care

## 2020-09-08 DIAGNOSIS — E782 Mixed hyperlipidemia: Secondary | ICD-10-CM

## 2020-09-08 MED ORDER — ROSUVASTATIN CALCIUM 40 MG PO TABS
40.0000 mg | ORAL_TABLET | Freq: Every day | ORAL | 3 refills | Status: DC
  Filled 2020-09-08: qty 90, 90d supply, fill #0

## 2020-09-08 NOTE — Telephone Encounter (Signed)
Please provide clarity on results for patient.

## 2020-09-08 NOTE — Telephone Encounter (Signed)
Spoke with patient read message from my chart she thought she was going to be called

## 2020-09-13 ENCOUNTER — Ambulatory Visit: Payer: Self-pay

## 2020-09-13 NOTE — Telephone Encounter (Signed)
Pt. Reports she has changed her diet recently and lost 8 lbs. Last Wednesday started having upper abdominal pain. Has nausea, bloating. Pain was worse over the weekend. Took TUMS and Pepto. Noticed black stools after taking Pepto - instructed Pepto can cause this. No diarrhea. "Not as regular with bowel movements as I normally am." Pain comes and goes.Pt. would like to be seen as soon as possible. Please advise.

## 2020-09-13 NOTE — Telephone Encounter (Signed)
Answer Assessment - Initial Assessment Questions 1. LOCATION: "Where does it hurt?"      Upper 2. RADIATION: "Does the pain shoot anywhere else?" (e.g., chest, back)     Ribs 3. ONSET: "When did the pain begin?" (e.g., minutes, hours or days ago)      Last Wednesday 4. SUDDEN: "Gradual or sudden onset?"     Gradual 5. PATTERN "Does the pain come and go, or is it constant?"    - If constant: "Is it getting better, staying the same, or worsening?"      (Note: Constant means the pain never goes away completely; most serious pain is constant and it progresses)     - If intermittent: "How long does it last?" "Do you have pain now?"     (Note: Intermittent means the pain goes away completely between bouts)     Comes and goes 6. SEVERITY: "How bad is the pain?"  (e.g., Scale 1-10; mild, moderate, or severe)   - MILD (1-3): doesn't interfere with normal activities, abdomen soft and not tender to touch    - MODERATE (4-7): interferes with normal activities or awakens from sleep, abdomen tender to touch    - SEVERE (8-10): excruciating pain, doubled over, unable to do any normal activities      Worse - 8-9 7. RECURRENT SYMPTOM: "Have you ever had this type of stomach pain before?" If Yes, ask: "When was the last time?" and "What happened that time?"      No 8. CAUSE: "What do you think is causing the stomach pain?"     Unsure 9. RELIEVING/AGGRAVATING FACTORS: "What makes it better or worse?" (e.g., movement, antacids, bowel movement)     TUMS 10. OTHER SYMPTOMS: "Do you have any other symptoms?" (e.g., back pain, diarrhea, fever, urination pain, vomiting)       Bloating 11. PREGNANCY: "Is there any chance you are pregnant?" "When was your last menstrual period?"       No  Protocols used: Abdominal Pain - [facility]

## 2020-09-14 NOTE — Telephone Encounter (Signed)
I have called and left a voicemail asking the patient to call back and schedule an appointment at her earliest convince.

## 2020-09-14 NOTE — Telephone Encounter (Signed)
Please schedule patient for an appointment.

## 2020-09-15 ENCOUNTER — Ambulatory Visit (INDEPENDENT_AMBULATORY_CARE_PROVIDER_SITE_OTHER): Payer: Medicaid Other | Admitting: Clinical

## 2020-09-15 ENCOUNTER — Other Ambulatory Visit: Payer: Self-pay

## 2020-09-15 DIAGNOSIS — F32A Depression, unspecified: Secondary | ICD-10-CM

## 2020-09-15 DIAGNOSIS — F419 Anxiety disorder, unspecified: Secondary | ICD-10-CM

## 2020-09-15 DIAGNOSIS — F439 Reaction to severe stress, unspecified: Secondary | ICD-10-CM

## 2020-09-16 ENCOUNTER — Other Ambulatory Visit (HOSPITAL_COMMUNITY): Payer: Self-pay

## 2020-09-16 ENCOUNTER — Encounter (INDEPENDENT_AMBULATORY_CARE_PROVIDER_SITE_OTHER): Payer: Self-pay
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
## 2020-09-21 ENCOUNTER — Encounter (INDEPENDENT_AMBULATORY_CARE_PROVIDER_SITE_OTHER): Payer: Self-pay | Admitting: Primary Care

## 2020-09-22 ENCOUNTER — Other Ambulatory Visit: Payer: Self-pay

## 2020-09-22 ENCOUNTER — Ambulatory Visit (INDEPENDENT_AMBULATORY_CARE_PROVIDER_SITE_OTHER): Payer: Medicaid Other | Admitting: Clinical

## 2020-09-22 DIAGNOSIS — F411 Generalized anxiety disorder: Secondary | ICD-10-CM | POA: Diagnosis not present

## 2020-09-22 DIAGNOSIS — T1490XA Injury, unspecified, initial encounter: Secondary | ICD-10-CM | POA: Diagnosis not present

## 2020-09-22 DIAGNOSIS — F331 Major depressive disorder, recurrent, moderate: Secondary | ICD-10-CM | POA: Diagnosis not present

## 2020-09-22 DIAGNOSIS — F439 Reaction to severe stress, unspecified: Secondary | ICD-10-CM

## 2020-09-23 ENCOUNTER — Encounter (INDEPENDENT_AMBULATORY_CARE_PROVIDER_SITE_OTHER): Payer: Self-pay
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
## 2020-09-26 ENCOUNTER — Other Ambulatory Visit (INDEPENDENT_AMBULATORY_CARE_PROVIDER_SITE_OTHER): Payer: Self-pay

## 2020-09-26 DIAGNOSIS — E782 Mixed hyperlipidemia: Secondary | ICD-10-CM

## 2020-09-26 MED ORDER — ROSUVASTATIN CALCIUM 40 MG PO TABS: 40.0000 mg | ORAL_TABLET | Freq: Every day | ORAL | 3 refills | Status: DC

## 2020-10-06 ENCOUNTER — Ambulatory Visit (INDEPENDENT_AMBULATORY_CARE_PROVIDER_SITE_OTHER): Payer: Medicaid Other | Admitting: Clinical

## 2020-10-06 ENCOUNTER — Telehealth (INDEPENDENT_AMBULATORY_CARE_PROVIDER_SITE_OTHER): Payer: Self-pay

## 2020-10-06 NOTE — Telephone Encounter (Signed)
Copied from CRM [id number]. Topic: Appointment Scheduling - Scheduling Inquiry for Clinic >> Oct 06, 2020 12:21 PM Leafy Ro wrote: Reason for CRM: Pt is calling to rsc her appt with behavorial health

## 2020-10-13 ENCOUNTER — Other Ambulatory Visit: Payer: Self-pay

## 2020-10-13 ENCOUNTER — Ambulatory Visit (INDEPENDENT_AMBULATORY_CARE_PROVIDER_SITE_OTHER): Payer: Medicaid Other | Admitting: Clinical

## 2020-10-13 DIAGNOSIS — F411 Generalized anxiety disorder: Secondary | ICD-10-CM

## 2020-10-13 DIAGNOSIS — F331 Major depressive disorder, recurrent, moderate: Secondary | ICD-10-CM | POA: Diagnosis not present

## 2020-10-14 ENCOUNTER — Encounter (INDEPENDENT_AMBULATORY_CARE_PROVIDER_SITE_OTHER): Payer: Self-pay
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
## 2020-10-26 ENCOUNTER — Ambulatory Visit (INDEPENDENT_AMBULATORY_CARE_PROVIDER_SITE_OTHER): Payer: Medicaid Other | Admitting: Primary Care

## 2020-10-27 ENCOUNTER — Ambulatory Visit (INDEPENDENT_AMBULATORY_CARE_PROVIDER_SITE_OTHER): Payer: Medicaid Other | Admitting: Clinical

## 2020-10-27 ENCOUNTER — Other Ambulatory Visit: Payer: Self-pay

## 2020-10-27 DIAGNOSIS — F331 Major depressive disorder, recurrent, moderate: Secondary | ICD-10-CM | POA: Diagnosis not present

## 2020-10-27 DIAGNOSIS — F411 Generalized anxiety disorder: Secondary | ICD-10-CM | POA: Diagnosis not present

## 2020-10-28 ENCOUNTER — Encounter (INDEPENDENT_AMBULATORY_CARE_PROVIDER_SITE_OTHER): Payer: Self-pay

## 2020-10-28 NOTE — BH Specialist Note (Signed)
Integrated Behavioral Health via Telemedicine Visit  10/28/2020 Erin Ruiz [id number]  Number of Integrated Behavioral Health visits: 4/6 Session Start time: 1:35pm  Session End time: 2:00pm Total time:  25 minutes  Referring Provider: PCP [name] Patient/Family location: Pt's vehicle [name] Provider location: [facility] Office All persons participating in visit: Pt and LCSWA Types of Service: Individual psychotherapy and Video visit  I connected with Erin Ruiz via Video Enabled Telemedicine Application  (Video is Caregility application) and verified that I am speaking with the correct person using two identifiers. Discussed confidentiality: Yes   I discussed the limitations of telemedicine and the availability of in person appointments.  Discussed there is a possibility of technology failure and discussed alternative modes of communication if that failure occurs.  I discussed that engaging in this telemedicine visit, they consent to the provision of behavioral healthcare and the services will be billed under their insurance.  Patient and/or legal guardian expressed understanding and consented to Telemedicine visit: Yes   Presenting Concerns: Patient and/or family reports the following symptoms/concerns: Pt reports feeling anxious, depressed, feeling tired, difficulty relaxing, difficulty sleeping, increased tearfulness, and excessive worrying. Reports that she continues to experience problems in her marriage. Reports that she frequently argues with her spouse and wants to leave. Reports that she and her spouse are considering selling their home. Reports that she is always stressed. Reports that she has difficulty understanding why she has continued to remain in the marriage. Duration of problem: 10 years; Severity of problem: moderate  Patient and/or Family's Strengths/Protective Factors: Social and Emotional competence, Concrete supports in place (healthy food, safe environments,  etc.), Sense of purpose, Physical Health (exercise, healthy diet, medication compliance, etc.), and Parental Resilience  Goals Addressed: Patient will:  Reduce symptoms of: agitation, anxiety, depression, and stress   Increase knowledge and/or ability of: coping skills and stress reduction   Demonstrate ability to: Increase healthy adjustment to current life circumstances and Increase adequate support systems for patient/family  Progress towards Goals: Ongoing  Interventions: Interventions utilized:  Mindfulness or Management consultant, CBT Cognitive Behavioral Therapy, Supportive Counseling, and Psychoeducation and/or Health Education Standardized Assessments completed: Not Needed  Patient and/or Family Response: Pt receptive to tx. Pt has had difficulty incorporating relaxation techniques with yoga and meditation. Pt will attempt to incorporate relaxation techniques. Pt receptive to  psychoeducation provided on trauma and it's association with pt remaining in an unhealthy relationships. Pt receptive to cognitive restructuring in order to improve pt's cognitive processing and decision making skills.   Assessment: Denies SI/HI. Denies auditory/visual hallucinations. No safety risks. Patient currently experiencing depression and anxiety related to marriage. Pt appears to be impacted by trauma as a result of pt's difficulty with cognitve processing and decision making within her marriage. Pt also appears to have difficulty with putting herself first and self-esteem. Pt appears to have decided to leave her husband and is having difficulty with accepting her decision due to seeing unhealthy relationships in her life continue to remain together.   Patient may benefit from ongoing cognitive restructuring. Pt would also benefit from affordable housing resources. LCSWA encouraged pt to incorporate relaxation techniques in order to improve cognitve processing. LCSWA will send pt housing resources and will  send [name] resource again. LCSWA will fu with pt. Plan: Follow up with behavioral health clinician on : 11/17/20 Behavioral recommendations: Continue processing in order identify appropriate decision, utilize deep breathing exercises, yoga, and meditation, and continue establishing health boundaries with spouse Referral(s): Integrated [employer] (In  Clinic) and [employer]:  [facility] and [facility]  I discussed the assessment and treatment plan with the patient and/or parent/guardian. They were provided an opportunity to ask questions and all were answered. They agreed with the plan and demonstrated an understanding of the instructions.   They were advised to call back or seek an in-person evaluation if the symptoms worsen or if the condition fails to improve as anticipated.  [name], LCSW

## 2020-11-10 ENCOUNTER — Other Ambulatory Visit: Payer: Self-pay | Admitting: *Deleted

## 2020-11-10 MED ORDER — BLISOVI 24 FE 1-20 MG-MCG(24) PO TABS: 1.0000 | ORAL_TABLET | Freq: Every day | ORAL | 6 refills | Status: DC

## 2020-11-10 NOTE — Progress Notes (Signed)
Pt called to office stating she needs new Rx for [facility] to be sent to local pharmacy, no longer using mail order.  Rx was sent today with refills.

## 2020-11-17 ENCOUNTER — Encounter (INDEPENDENT_AMBULATORY_CARE_PROVIDER_SITE_OTHER): Payer: Medicaid Other | Admitting: Clinical

## 2020-11-23 ENCOUNTER — Encounter: Payer: Self-pay | Admitting: Women's Health

## 2020-11-23 ENCOUNTER — Ambulatory Visit (INDEPENDENT_AMBULATORY_CARE_PROVIDER_SITE_OTHER): Payer: BC Managed Care – PPO | Admitting: Women's Health

## 2020-11-23 ENCOUNTER — Encounter (INDEPENDENT_AMBULATORY_CARE_PROVIDER_SITE_OTHER): Payer: Self-pay | Admitting: Primary Care

## 2020-11-23 ENCOUNTER — Telehealth (INDEPENDENT_AMBULATORY_CARE_PROVIDER_SITE_OTHER): Payer: Self-pay

## 2020-11-23 ENCOUNTER — Other Ambulatory Visit: Payer: Self-pay

## 2020-11-23 ENCOUNTER — Other Ambulatory Visit (HOSPITAL_COMMUNITY)
Admission: RE | Admit: 2020-11-23 | Discharge: 2020-11-23 | Disposition: A | Discharge status: Home / Self Care | Payer: BC Managed Care – PPO | Source: Ambulatory Visit | Attending: Women's Health | Admitting: Women's Health

## 2020-11-23 ENCOUNTER — Ambulatory Visit (INDEPENDENT_AMBULATORY_CARE_PROVIDER_SITE_OTHER): Payer: Medicaid Other | Admitting: Primary Care

## 2020-11-23 VITALS — BP 130/83 | HR 101 | Ht 64.0 in | Wt 183.4 lb

## 2020-11-23 VITALS — BP 115/77 | HR 86 | Temp 97.7°F | Ht 64.0 in | Wt 181.8 lb

## 2020-11-23 DIAGNOSIS — N930 Postcoital and contact bleeding: Secondary | ICD-10-CM | POA: Insufficient documentation

## 2020-11-23 DIAGNOSIS — Z01419 Encounter for gynecological examination (general) (routine) without abnormal findings: Secondary | ICD-10-CM | POA: Insufficient documentation

## 2020-11-23 DIAGNOSIS — Z113 Encounter for screening for infections with a predominantly sexual mode of transmission: Secondary | ICD-10-CM

## 2020-11-23 DIAGNOSIS — Z79899 Other long term (current) drug therapy: Secondary | ICD-10-CM

## 2020-11-23 DIAGNOSIS — F331 Major depressive disorder, recurrent, moderate: Secondary | ICD-10-CM

## 2020-11-23 DIAGNOSIS — E782 Mixed hyperlipidemia: Secondary | ICD-10-CM

## 2020-11-23 DIAGNOSIS — Z23 Encounter for immunization: Secondary | ICD-10-CM | POA: Diagnosis not present

## 2020-11-23 DIAGNOSIS — R1010 Upper abdominal pain, unspecified: Secondary | ICD-10-CM

## 2020-11-23 NOTE — Patient Instructions (Addendum)
Tdap (Tetanus, Diphtheria, Pertussis) Vaccine: What You Need to Know 1. Why get vaccinated? Tdap vaccine can prevent tetanus, diphtheria, and pertussis. Diphtheria and pertussis spread from person to person. Tetanus enters the body through cuts or wounds. TETANUS (T) causes painful stiffening of the muscles. Tetanus can lead to serious health problems, including being unable to open the mouth, having trouble swallowing and breathing, or death. DIPHTHERIA (D) can lead to difficulty breathing, heart failure, paralysis, or death. PERTUSSIS (aP), also known as "whooping cough," can cause uncontrollable, violent coughing that makes it hard to breathe, eat, or drink. Pertussis can be extremely serious especially in babies and young children, causing pneumonia, convulsions, brain damage, or death. In teens and adults, it can cause weight loss, loss of bladder control, passing out, and rib fractures from severe coughing. 2. Tdap vaccine Tdap is only for children 7 years and older, adolescents, and adults.  Adolescents should receive a single dose of Tdap, preferably at age 11 or 12 years. Pregnant people should get a dose of Tdap during every pregnancy, preferably during the early part of the third trimester, to help protect the newborn from pertussis. Infants are most at risk for severe, life-threatening complications from pertussis. Adults who have never received Tdap should get a dose of Tdap. Also, adults should receive a booster dose of either Tdap or Td (a different vaccine that protects against tetanus and diphtheria but not pertussis) every 10 years, or after 5 years in the case of a severe or dirty wound or burn. Tdap may be given at the same time as other vaccines. 3. Talk with your health care provider Tell your vaccine provider if the person getting the vaccine: Has had an allergic reaction after a previous dose of any vaccine that protects against tetanus, diphtheria, or pertussis, or has any  severe, life-threatening allergies Has had a coma, decreased level of consciousness, or prolonged seizures within 7 days after a previous dose of any pertussis vaccine (DTP, DTaP, or Tdap) Has seizures or another nervous system problem Has ever had Guillain-Barr Syndrome (also called "GBS") Has had severe pain or swelling after a previous dose of any vaccine that protects against tetanus or diphtheria In some cases, your health care provider may decide to postpone Tdap vaccination until a future visit. People with minor illnesses, such as a cold, may be vaccinated. People who are moderately or severely ill should usually wait until they recover before getting Tdap vaccine.  Your health care provider can give you more information. 4. Risks of a vaccine reaction Pain, redness, or swelling where the shot was given, mild fever, headache, feeling tired, and nausea, vomiting, diarrhea, or stomachache sometimes happen after Tdap vaccination. People sometimes faint after medical procedures, including vaccination. Tell your provider if you feel dizzy or have vision changes or ringing in the ears.  As with any medicine, there is a very remote chance of a vaccine causing a severe allergic reaction, other serious injury, or death. 5. What if there is a serious problem? An allergic reaction could occur after the vaccinated person leaves the clinic. If you see signs of a severe allergic reaction (hives, swelling of the face and throat, difficulty breathing, a fast heartbeat, dizziness, or weakness), call 9-1-1 and get the person to the nearest hospital. For other signs that concern you, call your health care provider.  Adverse reactions should be reported to the Vaccine Adverse Event Reporting System (VAERS). Your health care provider will usually file this report, or you   can do it yourself. Visit the VAERS website at www.vaers.LAgents.no or call 6706842174. VAERS is only for reporting reactions, and VAERS staff  members do not give medical advice. 6. The National Vaccine Injury Compensation Program The [employer] Vaccine Injury Compensation Program (VICP) is a federal program that was created to compensate people who may have been injured by certain vaccines. Claims regarding alleged injury or death due to vaccination have a time limit for filing, which may be as short as two years. Visit the VICP website at SpiritualWord.at or call 669-857-2058 to learn about the program and about filing a claim. 7. How can I learn more? Ask your health care provider. Call your local or state health department. Visit the website of the Food and Drug Administration (FDA) for vaccine package inserts and additional information at FinderList.no. Contact the Centers for Disease Control and Prevention (CDC): Call (484) 083-1511 (1-800-CDC-INFO) or Visit CDC's website at PicCapture.uy. Vaccine Information Statement Tdap (Tetanus, Diphtheria, Pertussis) Vaccine (10/09/2019) This information is not intended to replace advice given to you by your health care provider. Make sure you discuss any questions you have with your health care provider. Document Revised: 11/04/2019 Document Reviewed: 11/04/2019 Elsevier Patient Education  2022 Elsevier Inc. Buspirone Tablets What is this medication? BUSPIRONE (byoo SPYE rone) treats anxiety. It works by balancing the levels of dopamine and serotonin in your brain, hormones that help regulate mood. This medicine may be used for other purposes; ask your health care provider or pharmacist if you have questions. COMMON BRAND NAME(S): BuSpar What should I tell my care team before I take this medication? They need to know if you have any of these conditions: Kidney or liver disease An unusual or allergic reaction to buspirone, other medications, foods, dyes, or preservatives Pregnant or trying to get pregnant Breast-feeding How should  I use this medication? Take this medication by mouth with a glass of water. Follow the directions on the prescription label. You may take this medication with or without food. To ensure that this medication always works the same way for you, you should take it either always with or always without food. Take your doses at regular intervals. Do not take your medication more often than directed. Do not stop taking except on the advice of your care team. Talk to your care team about the use of this medication in children. Special care may be needed. Overdosage: If you think you have taken too much of this medicine contact a poison control center or emergency room at once. NOTE: This medicine is only for you. Do not share this medicine with others. What if I miss a dose? If you miss a dose, take it as soon as you can. If it is almost time for your next dose, take only that dose. Do not take double or extra doses. What may interact with this medication? Do not take this medication with any of the following: Linezolid MAOIs like Carbex, Eldepryl, Marplan, Nardil, and Parnate Methylene blue Procarbazine This medication may also interact with the following: Diazepam Digoxin Diltiazem Erythromycin Grapefruit juice Haloperidol Medications for mental depression or mood problems Medications for seizures like carbamazepine, phenobarbital and phenytoin Nefazodone Other medications for anxiety Rifampin Ritonavir Some antifungal medications like itraconazole, ketoconazole, and voriconazole Verapamil Warfarin This list may not describe all possible interactions. Give your health care provider a list of all the medicines, herbs, non-prescription drugs, or dietary supplements you use. Also tell them if you smoke, drink alcohol, or use illegal drugs. Some items  may interact with your medicine. What should I watch for while using this medication? Visit your care team for regular checks on your progress. It  may take 1 to 2 weeks before your anxiety gets better. You may get drowsy or dizzy. Do not drive, use machinery, or do anything that needs mental alertness until you know how this medication affects you. Do not stand or sit up quickly, especially if you are an older patient. This reduces the risk of dizzy or fainting spells. Alcohol can make you more drowsy and dizzy. Avoid alcoholic drinks. What side effects may I notice from receiving this medication? Side effects that you should report to your care team as soon as possible: Allergic reactions-skin rash, itching, hives, swelling of the face, lips, tongue, or throat Irritability, confusion, fast or irregular heartbeat, muscle stiffness, twitching muscles, sweating, high fever, seizure, chills, vomiting, diarrhea, which may be signs of serotonin syndrome Side effects that usually do not require medical attention (report to your care team if they continue or are bothersome): Anxiety or nervousness Dizziness Drowsiness Headache Nausea Trouble sleeping This list may not describe all possible side effects. Call your doctor for medical advice about side effects. You may report side effects to FDA at 1-800-FDA-1088. Where should I keep my medication? Keep out of the reach of children. Store at room temperature below 30 degrees C (86 degrees F). Protect from light. Keep container tightly closed. Throw away any unused medication after the expiration date. NOTE: This sheet is a summary. It may not cover all possible information. If you have questions about this medicine, talk to your doctor, pharmacist, or health care provider.  2022 Elsevier/Gold Standard (2020-05-18 09:59:46)

## 2020-11-23 NOTE — Patient Instructions (Signed)
Preventive Care 21-29 Years Old, Female Preventive care refers to lifestyle choices and visits with your health care provider that can promote health and wellness. This includes: A yearly physical exam. This is also called an annual wellness visit. Regular dental and eye exams. Immunizations. Screening for certain conditions. Healthy lifestyle choices, such as: Eating a healthy diet. Getting regular exercise. Not using drugs or products that contain nicotine and tobacco. Limiting alcohol use. What can I expect for my preventive care visit? Physical exam Your health care provider may check your: Height and weight. These may be used to calculate your BMI (body mass index). BMI is a measurement that tells if you are at a healthy weight. Heart rate and blood pressure. Body temperature. Skin for abnormal spots. Counseling Your health care provider may ask you questions about your: Past medical problems. Family's medical history. Alcohol, tobacco, and drug use. Emotional well-being. Home life and relationship well-being. Sexual activity. Diet, exercise, and sleep habits. Work and work environment. Access to firearms. Method of birth control. Menstrual cycle. Pregnancy history. What immunizations do I need? Vaccines are usually given at various ages, according to a schedule. Your health care provider will recommend vaccines for you based on your age, medical history, and lifestyle or other factors, such as travel or where you work. What tests do I need? Blood tests Lipid and cholesterol levels. These may be checked every 5 years starting at age 20. Hepatitis C test. Hepatitis B test. Screening Diabetes screening. This is done by checking your blood sugar (glucose) after you have not eaten for a while (fasting). STD (sexually transmitted disease) testing, if you are at risk. BRCA-related cancer screening. This may be done if you have a family history of breast, ovarian, tubal, or  peritoneal cancers. Pelvic exam and Pap test. This may be done every 3 years starting at age 21. Starting at age 30, this may be done every 5 years if you have a Pap test in combination with an HPV test. Talk with your health care provider about your test results, treatment options, and if necessary, the need for more tests. Follow these instructions at home: Eating and drinking  Eat a healthy diet that includes fresh fruits and vegetables, whole grains, lean protein, and low-fat dairy products. Take vitamin and mineral supplements as recommended by your health care provider. Do not drink alcohol if: Your health care provider tells you not to drink. You are pregnant, may be pregnant, or are planning to become pregnant. If you drink alcohol: Limit how much you have to 0-1 drink a day. Be aware of how much alcohol is in your drink. In the [place] one drink equals one 12 oz bottle of beer (355 mL), one 5 oz glass of wine (148 mL), or one 1 oz glass of hard liquor (44 mL). Lifestyle Take daily care of your teeth and gums. Brush your teeth every morning and night with fluoride toothpaste. Floss one time each day. Stay active. Exercise for at least 30 minutes 5 or more days each week. Do not use any products that contain nicotine or tobacco, such as cigarettes, e-cigarettes, and chewing tobacco. If you need help quitting, ask your health care provider. Do not use drugs. If you are sexually active, practice safe sex. Use a condom or other form of protection to prevent STIs (sexually transmitted infections). If you do not wish to become pregnant, use a form of birth control. If you plan to become pregnant, see your health care provider   for a prepregnancy visit. Find healthy ways to cope with stress, such as: Meditation, yoga, or listening to music. Journaling. Talking to a trusted person. Spending time with friends and family. Safety Always wear your seat belt while driving or riding in a  vehicle. Do not drive: If you have been drinking alcohol. Do not ride with someone who has been drinking. When you are tired or distracted. While texting. Wear a helmet and other protective equipment during sports activities. If you have firearms in your house, make sure you follow all gun safety procedures. Seek help if you have been physically or sexually abused. What's next? Go to your health care provider once a year for an annual wellness visit. Ask your health care provider how often you should have your eyes and teeth checked. Stay up to date on all vaccines. This information is not intended to replace advice given to you by your health care provider. Make sure you discuss any questions you have with your health care provider. Document Revised: 04/29/2020 Document Reviewed: 10/31/2017 Elsevier Patient Education  2022 [place] Breast self-awareness means being familiar with how your breasts look and feel. It involves checking your breasts regularly and reporting any changes to your health care provider. Practicing breast self-awareness is important. Sometimes changes may not be harmful (are benign), but sometimes a change in your breasts can be a sign of a serious medical problem. It is important to learn how to do this procedure correctly so that you can catch problems early, when treatment is more likely to be successful. All women should practice breast self-awareness, including women who have had breast implants. What you need: A mirror. A well-lit room. How to do a breast self-exam A breast self-exam is one way to learn what is normal for your breasts and whether your breasts are changing. To do a breast self-exam: Look for changes  Remove all the clothing above your waist. Stand in front of a mirror in a room with good lighting. Put your hands on your hips. Push your hands firmly downward. Compare your breasts in the mirror. Look for  differences between them (asymmetry), such as: Differences in shape. Differences in size. Puckers, dips, and bumps in one breast and not the other. Look at each breast for changes in the skin, such as: Redness. Scaly areas. Look for changes in your nipples, such as: Discharge. Bleeding. Dimpling. Redness. A change in position. Feel for changes Carefully feel your breasts for lumps and changes. It is best to do this while lying on your back on the floor, and again while sitting or standing in the tub or shower with soapy water on your skin. Feel each breast in the following way: Place the arm on the side of the breast you are examining above your head. Feel your breast with the other hand. Start in the nipple area and make -inch (2 cm) overlapping circles to feel your breast. Use the pads of your three middle fingers to do this. Apply light pressure, then medium pressure, then firm pressure. The light pressure will allow you to feel the tissue closest to the skin. The medium pressure will allow you to feel the tissue that is a little deeper. The firm pressure will allow you to feel the tissue close to the ribs. Continue the overlapping circles, moving downward over the breast until you feel your ribs below your breast. Move one finger-width toward the center of the body.  Continue to use the -inch (2 cm) overlapping circles to feel your breast as you move slowly up toward your collarbone. Continue the up-and-down exam using all three pressures until you reach your armpit.  Write down what you find Writing down what you find can help you remember what to discuss with your health care provider. Write down: What is normal for each breast. Any changes that you find in each breast, including: The kind of changes you find. Any pain or tenderness. Size and location of any lumps. Where you are in your menstrual cycle, if you are still menstruating. General tips and recommendations Examine your  breasts every month. If you are breastfeeding, the best time to examine your breasts is after a feeding or after using a breast pump. If you menstruate, the best time to examine your breasts is 5-7 days after your period. Breasts are generally lumpier during menstrual periods, and it may be more difficult to notice changes. With time and practice, you will become more familiar with the variations in your breasts and more comfortable with the exam. Contact a health care provider if you: See a change in the shape or size of your breasts or nipples. See a change in the skin of your breast or nipples, such as a reddened or scaly area. Have unusual discharge from your nipples. Find a lump or thick area that was not there before. Have pain in your breasts. Have any concerns related to your breast health. Summary Breast self-awareness includes looking for physical changes in your breasts, as well as feeling for any changes within your breasts. Breast self-awareness should be performed in front of a mirror in a well-lit room. You should examine your breasts every month. If you menstruate, the best time to examine your breasts is 5-7 days after your menstrual period. Let your health care provider know of any changes you notice in your breasts, including changes in size, changes on the skin, pain or tenderness, or unusual fluid from your nipples. This information is not intended to replace advice given to you by your health care provider. Make sure you discuss any questions you have with your health care provider. Document Revised: 10/08/2017 Document Reviewed: 10/08/2017 Elsevier Patient Education  [place].

## 2020-11-23 NOTE — Progress Notes (Signed)
Erin Ruiz, is a 30 y.o. female  [id number]:[id number]  [id number]  DOB - [birth year]  Chief Complaint  Patient presents with   Follow-up    Medication management        Subjective:   [name] is a 30 y.o. obese female here today for a follow up visit on medication Wellbutrin. In the last 2 months she has been having severe sharpe abdominal pains located upper gastric area . Intermittent sharpe ,knife like pain 8/10. Last 30 sec subsides than returns all day. Denies any contributing factors No red flags present- Avoid fried, spicy, fatty, greasy, and acidic foods. Do not eat caffeine, nicotine, and alcohol.  Patient has No headache, No chest pain,  she has has nausea, No new weakness tingling or numbness, No Cough - SOB.  No problems updated.  ALLERGIES: No Known Allergies  PAST MEDICAL HISTORY: Past Medical History:  Diagnosis Date   Depression    Encounter for supervision of other normal pregnancy in first trimester 12/02/2014   Heart murmur    childhood   Preeclampsia 06/01/2015    MEDICATIONS AT HOME: Prior to Admission medications   Medication Sig Start Date End Date Taking? Authorizing Provider  buPROPion (WELLBUTRIN SR) 150 MG 12 hr tablet Take 1 tablet (150 mg total) by mouth 2 (two) times daily. 08/30/20  Yes [name], NP  escitalopram (LEXAPRO) 20 MG tablet Take 1 tablet (20 mg total) by mouth daily. 08/30/20  Yes [name], NP  Norethindrone Acetate-Ethinyl Estrad-FE (BLISOVI 24 FE) 1-20 MG-MCG(24) tablet Take 1 tablet by mouth daily. 11/10/20  Yes Adam Phenix, MD  rosuvastatin (CRESTOR) 40 MG tablet Take 1 tablet (40 mg total) by mouth daily. 09/26/20  Yes [name], NP  BIOTIN MAXIMUM PO Take 1 tablet by mouth daily.    [provider]    Objective:   Vitals:   11/23/20 0945  BP: 115/77  Pulse: 86  Temp: 97.7 F (36.5 C)  TempSrc: Temporal  SpO2: 94%  Weight: 181 lb 12.8 oz (82.5 kg)  Height: 5\' 4"   (1.626 m)   Exam General appearance : Awake, alert, not in any distress. Speech Clear. Not toxic looking HEENT: Atraumatic and Normocephalic, pupils equally reactive to light and accomodation Neck: Supple, no JVD. No cervical lymphadenopathy.  Chest: Good air entry bilaterally, no added sounds  CVS: S1 S2 regular, no murmurs.  Abdomen: Bowel sounds present, Non tender and not distended with no gaurding, rigidity or rebound. Extremities: B/L Lower Ext shows no edema, both legs are warm to touch Neurology: Awake alert, and oriented X 3, CN II-XII intact, Non focal Skin: No Rash  Data Review Lab Results  Component Value Date   HGBA1C 5.2 08/30/2020   HGBA1C 5.30 03/03/2014    Assessment & Plan  Erin Ruiz was seen today for follow-up.  Diagnoses and all orders for this visit:  Need for Tdap vaccination -     Tdap vaccine greater than or equal to 7yo IM  Follow-up encounter involving medication On Wellbutrin 08/30/20 added to lexapro no improvement actually more irritable will /d/c medication   Pain of upper abdomen On a diet does not eat acidic foods, spicy foods, alcohol caffeine and tobacco and certain medications. Avoid laying down after eating 71mins-1hour, elevated head of the bed.  She does eat fried foods wings occasionally.   Moderate episode of recurrent major depressive disorder (HCC) On Lexapro , Wellbutrin did not provide a synergetic affect not change in  mood or affect at all but did cause irritability. . Provided information on Buspar she will continue visit with CSW and if she decides to add Buspar . She will make an appt.   Patient have been counseled extensively about nutrition and exercise. Other issues discussed during this visit include: low cholesterol diet, weight control and daily exercise, foot care, annual eye examinations at Ophthalmology, importance of adherence with medications and regular follow-up. We also discussed long term complications of uncontrolled  diabetes and hypertension.   No follow-ups on file.  The patient was given clear instructions to go to ER or return to medical center if symptoms don't improve, worsen or new problems develop. The patient verbalized understanding. The patient was told to call to get lab results if they haven't heard anything in the next week.   This note has been created with Education officer, environmental. Any transcriptional errors are unintentional.    [name], MD, MHA, Maxwell Caul, CPE [facility] and [facility] [place], [place] [phone]   11/23/2020, 10:15 AM

## 2020-11-23 NOTE — Progress Notes (Signed)
GYNECOLOGY ANNUAL PREVENTATIVE CARE ENCOUNTER NOTE  History:     Erin Ruiz is a 30 y.o. [phone] female here for a routine annual gynecologic exam.  Current complaints: post-coital bleeding.   Denies abnormal vaginal bleeding, discharge, pelvic pain, or other gynecologic concerns. Pt requests STD testing today. Pt reports she does not perform SBE. Pt denies bowel or bladder concerns. No family hx of breast, colon, endometrial or ovarian cancer. Pt does not smoke, drink or use drugs.     Gynecologic History Patient's last menstrual period was 11/05/2020 (exact date). Menstruation: once per month, 3 days, moderate bleeding, mild dysmenorrhea. Contraception: OCP (estrogen/progesterone) - [facility]. Pt reports she does not desire pregnancy in the next year. Last Pap: 01/2018. Results were: normal per EPIC. Pt denies history of abnormal Pap.  Obstetric History OB History  Gravida Para Term Preterm AB Living  4 3 3   1 3   SAB IAB Ectopic Multiple Live Births  1     0 3    # Outcome Date GA Lbr Len/2nd Weight Sex Delivery Anes PTL Lv  4 SAB 12/28/18 [redacted]w[redacted]d    SAB     3 Term 06/01/15 [redacted]w[redacted]d 05:13 / 00:11 7 lb 12.5 oz (3.53 kg) M Vag-Spont EPI  LIV  2 Term 07/30/13 [redacted]w[redacted]d 08:00 / 00:24 7 lb 9 oz (3.43 kg) F Vag-Spont EPI  LIV  1 Term 12/30/09 [redacted]w[redacted]d  7 lb 1 oz (3.204 kg) F Vag-Spont None  LIV     Birth Comments: No complications    Past Medical History:  Diagnosis Date   Depression    Encounter for supervision of other normal pregnancy in first trimester 12/02/2014   Heart murmur    childhood   Hypercholesterolemia    Preeclampsia 06/01/2015    Past Surgical History:  Procedure Laterality Date   TONSILLECTOMY  2004   WISDOM TOOTH EXTRACTION      Current Outpatient Medications on File Prior to Visit  Medication Sig Dispense Refill   BIOTIN MAXIMUM PO Take 1 tablet by mouth daily.     buPROPion (WELLBUTRIN SR) 150 MG 12 hr tablet Take 1 tablet (150 mg total) by mouth 2  (two) times daily. 180 tablet 0   escitalopram (LEXAPRO) 20 MG tablet Take 1 tablet (20 mg total) by mouth daily. 90 tablet 1   Norethindrone Acetate-Ethinyl Estrad-FE (BLISOVI 24 FE) 1-20 MG-MCG(24) tablet Take 1 tablet by mouth daily. 28 tablet 6   rosuvastatin (CRESTOR) 40 MG tablet Take 1 tablet (40 mg total) by mouth daily. 90 tablet 3   No current facility-administered medications on file prior to visit.    No Known Allergies  Social History:  reports that she has quit smoking. She has never used smokeless tobacco. She reports current alcohol use. She reports that she does not use drugs.  Family History  Problem Relation Age of Onset   Hypertension Mother    Asthma Father     The following portions of the patient's history were reviewed and updated as appropriate: allergies, current medications, past family history, past medical history, past social history, past surgical history and problem list.  Review of Systems Pertinent items noted in HPI and remainder of comprehensive ROS otherwise negative.  Physical Exam:  BP 130/83   Pulse (!) 101   Ht 5\' 4"  (1.626 m)   Wt 183 lb 6.4 oz (83.2 kg)   LMP 11/05/2020 (Exact Date)   BMI 31.48 kg/m   CONSTITUTIONAL: Well-developed, well-nourished female in no  acute distress.  HENT:  Normocephalic, atraumatic, External right and left ear normal. EYES: Conjunctivae and EOM are normal. Pupils are equal, round, and reactive to light. No scleral icterus.  NECK: Normal range of motion, supple, no masses.  Normal thyroid.  SKIN: Skin is warm and dry. No rash noted. Not diaphoretic. No erythema. No pallor. MUSCULOSKELETAL: Normal range of motion. No tenderness.  No cyanosis, clubbing, or edema. NEUROLOGIC: Alert and oriented to person, place, and time. Normal reflexes, muscle tone coordination. PSYCHIATRIC: Normal mood and affect. Normal behavior. Normal judgment and thought content. CARDIOVASCULAR: Normal heart rate noted, regular  rhythm. RESPIRATORY: Clear to auscultation bilaterally. Effort and breath sounds normal, no problems with respiration noted. BREASTS: Symmetric in size. No masses, skin changes, nipple drainage, or lymphadenopathy. ABDOMEN: Soft, normal bowel sounds, no distention noted.  No tenderness, rebound or guarding.  PELVIC: Normal appearing external genitalia; normal appearing vaginal mucosa and cervix - ectropion present.  No abnormal discharge noted.  Pap smear obtained.  Normal uterine size, no other palpable masses, no uterine or adnexal tenderness.   Assessment and Plan:  1. Encounter for well woman exam - Cytology - PAP( [place])  2. Routine screening for STI (sexually transmitted infection) - Hepatitis C Antibody - HIV Antibody (routine testing w rflx) - RPR - Hepatitis B surface antigen  3. PCB (post coital bleeding) - pt states the first incident was 10/13/2020 and has happened again x3 in total - pt reports no bleeding outside of sex - pt states bleeding looks red and has cramping after sex and then only notices spotting for no longer than 24 hours after - pt states she has a new partner since August - discussed link between ectropion cells and post-coital bleeding - Cytology - PAP( [place]) - Cervicovaginal ancillary only( [place])  Will follow up results of pap smear and other testing, if performed, and manage accordingly. Routine preventative health maintenance measures emphasized. Self-breast awareness taught, importance discussed, advised when to RTC, SBA literature given. Please refer to After Visit Summary for other counseling recommendations.      Gearldine Shown, [facility] Women's Health Nurse Practitioner, [facility] for [employer], [facility]

## 2020-11-23 NOTE — Progress Notes (Signed)
Patient presents for AEX. Patient states that every time she has intercourse she bleeds. Denies any pain with intercourse. She would like STD testing.  Last Pap 01/2018 Normal

## 2020-11-23 NOTE — Telephone Encounter (Signed)
Copied from CRM [id number]. Topic: Referral - Request for Referral >> Nov 23, 2020 10:57 AM Wyonia Hough E wrote: Has patient seen PCP for this complaint? yes *If NO, is insurance requiring patient see PCP for this issue before PCP can refer them? Referral for which specialty: Laurette Schimke Preferred provider/office:  Reason for referral: Pain of upper abdomin/ pt was advised by Marcelino Duster to see her GI but pt does not have a GI doctor/ please advise

## 2020-11-23 NOTE — Progress Notes (Signed)
She has been having stomach issues that she would like to discuss

## 2020-11-24 ENCOUNTER — Other Ambulatory Visit (INDEPENDENT_AMBULATORY_CARE_PROVIDER_SITE_OTHER): Payer: Self-pay | Admitting: Primary Care

## 2020-11-24 DIAGNOSIS — R1010 Upper abdominal pain, unspecified: Secondary | ICD-10-CM

## 2020-11-24 LAB — CERVICOVAGINAL ANCILLARY ONLY
Bacterial Vaginitis (gardnerella): NEGATIVE
Candida Glabrata: NEGATIVE
Candida Vaginitis: NEGATIVE
Chlamydia: NEGATIVE
Comment: NEGATIVE
Comment: NEGATIVE
Comment: NEGATIVE
Comment: NEGATIVE
Comment: NEGATIVE
Comment: NORMAL
Neisseria Gonorrhea: NEGATIVE
Trichomonas: NEGATIVE

## 2020-11-24 LAB — RPR: RPR Ser Ql: NONREACTIVE

## 2020-11-24 LAB — HEPATITIS B SURFACE ANTIGEN: Hepatitis B Surface Ag: NEGATIVE

## 2020-11-24 LAB — HIV ANTIBODY (ROUTINE TESTING W REFLEX): HIV Screen 4th Generation wRfx: NONREACTIVE

## 2020-11-24 LAB — CYTOLOGY - PAP: Diagnosis: NEGATIVE

## 2020-11-24 LAB — HEPATITIS C ANTIBODY: Hep C Virus Ab: <0.1 s/co ratio (ref 0.0–0.9)

## 2020-11-24 NOTE — Telephone Encounter (Signed)
Call patient misunderstood- would like to be referred to GI for intermittent abdominal pain no hematemesis or hematochezia.

## 2020-11-25 ENCOUNTER — Encounter: Payer: Self-pay | Admitting: Gastroenterology

## 2020-12-08 ENCOUNTER — Other Ambulatory Visit (INDEPENDENT_AMBULATORY_CARE_PROVIDER_SITE_OTHER): Payer: Self-pay | Admitting: Primary Care

## 2020-12-08 DIAGNOSIS — F419 Anxiety disorder, unspecified: Secondary | ICD-10-CM

## 2020-12-09 ENCOUNTER — Telehealth: Payer: BC Managed Care – PPO | Admitting: Physician Assistant

## 2020-12-09 DIAGNOSIS — K047 Periapical abscess without sinus: Secondary | ICD-10-CM

## 2020-12-09 MED ORDER — AMOXICILLIN-POT CLAVULANATE 875-125 MG PO TABS: 1.0000 | ORAL_TABLET | Freq: Two times a day (BID) | ORAL | 0 refills | Status: DC

## 2020-12-09 NOTE — Progress Notes (Signed)
Virtual Visit Consent   Erin Ruiz, you are scheduled for a virtual visit with a [facility] provider today.     Just as with appointments in the office, your consent must be obtained to participate.  Your consent will be active for this visit and any virtual visit you may have with one of our providers in the next 365 days.     If you have a MyChart account, a copy of this consent can be sent to you electronically.  All virtual visits are billed to your insurance company just like a traditional visit in the office.    As this is a virtual visit, video technology does not allow for your provider to perform a traditional examination.  This may limit your provider's ability to fully assess your condition.  If your provider identifies any concerns that need to be evaluated in person or the need to arrange testing (such as labs, EKG, etc.), we will make arrangements to do so.     Although advances in technology are sophisticated, we cannot ensure that it will always work on either your end or our end.  If the connection with a video visit is poor, the visit may have to be switched to a telephone visit.  With either a video or telephone visit, we are not always able to ensure that we have a secure connection.     I need to obtain your verbal consent now.   Are you willing to proceed with your visit today?    Erin Ruiz has provided verbal consent on 12/09/2020 for a virtual visit (video or telephone).   Piedad Climes, [place]   Date: 12/09/2020 5:18 PM   Virtual Visit via Video Note   I, Piedad Climes, connected with  Erin Ruiz  ([id number], [birth year]) on 12/09/20 at  5:15 PM EDT by a video-enabled telemedicine application and verified that I am speaking with the correct person using two identifiers.  Location: Patient: Virtual Visit Location Patient: Home Provider: Virtual Visit Location Provider: Home Office   I discussed the limitations of evaluation and management  by telemedicine and the availability of in person appointments. The patient expressed understanding and agreed to proceed.    History of Present Illness: Erin Ruiz is a 30 y.o. who identifies as a female who was assigned female at birth, and is being seen today for possible dental abscess. Notes pain around left lower tooth without discharge. Notes gum swelling and feeling like her cheek is puffy. . Normal ROM of jaw. Denies fever, chills, aches. Called her dentist and has appointment next week but could not be seen sooner.   HPI: HPI  Problems:  Patient Active Problem List   Diagnosis Date Noted   Anxiety and depression 03/20/2019   SAB (spontaneous abortion) 12/26/2018    Allergies: No Known Allergies Medications:  Current Outpatient Medications:    amoxicillin-clavulanate (AUGMENTIN) 875-125 MG tablet, Take 1 tablet by mouth 2 (two) times daily., Disp: 14 tablet, Rfl: 0   BIOTIN MAXIMUM PO, Take 1 tablet by mouth daily., Disp: , Rfl:    buPROPion (WELLBUTRIN SR) 150 MG 12 hr tablet, TAKE 1 TABLET BY MOUTH TWICE A DAY, Disp: 180 tablet, Rfl: 0   escitalopram (LEXAPRO) 20 MG tablet, Take 1 tablet (20 mg total) by mouth daily., Disp: 90 tablet, Rfl: 1   Norethindrone Acetate-Ethinyl Estrad-FE (BLISOVI 24 FE) 1-20 MG-MCG(24) tablet, Take 1 tablet by mouth daily., Disp: 28 tablet, Rfl: 6   rosuvastatin (CRESTOR) 40  MG tablet, Take 1 tablet (40 mg total) by mouth daily., Disp: 90 tablet, Rfl: 3  Observations/Objective: Patient is well-developed, well-nourished in no acute distress.  Resting comfortably in parked car.  Head is normocephalic, atraumatic.  No labored breathing. Speech is clear and coherent with logical content.  Patient is alert and oriented at baseline.  Left lower cheek swelling without redness. Very   Assessment and Plan: 1. Dental infection - amoxicillin-clavulanate (AUGMENTIN) 875-125 MG tablet; Take 1 tablet by mouth 2 (two) times daily.  Dispense: 14 tablet;  Refill: 0 History of same in the past. S/p root canal of tooth in question. Dentist feel they will likely have to pull due to recurrent infection. Appt with them as noted. For now supportive measures and OTC medications reviewed. Rx Augmentin for infection. Strict UC/ER precautions reviewed.   Follow Up Instructions: I discussed the assessment and treatment plan with the patient. The patient was provided an opportunity to ask questions and all were answered. The patient agreed with the plan and demonstrated an understanding of the instructions.  A copy of instructions were sent to the patient via MyChart unless otherwise noted below.   The patient was advised to call back or seek an in-person evaluation if the symptoms worsen or if the condition fails to improve as anticipated.  Time:  I spent 8 minutes with the patient via telehealth technology discussing the above problems/concerns.    Piedad Climes, PA-C

## 2020-12-09 NOTE — Patient Instructions (Signed)
  Erin Ruiz, thank you for joining Piedad Climes, PA-C for today's virtual visit.  While this provider is not your primary care provider (PCP), if your PCP is located in our provider database this encounter information will be shared with them immediately following your visit.  Consent: (Patient) Erin Ruiz provided verbal consent for this virtual visit at the beginning of the encounter.  Current Medications:  Current Outpatient Medications:    BIOTIN MAXIMUM PO, Take 1 tablet by mouth daily., Disp: , Rfl:    buPROPion (WELLBUTRIN SR) 150 MG 12 hr tablet, TAKE 1 TABLET BY MOUTH TWICE A DAY, Disp: 180 tablet, Rfl: 0   escitalopram (LEXAPRO) 20 MG tablet, Take 1 tablet (20 mg total) by mouth daily., Disp: 90 tablet, Rfl: 1   Norethindrone Acetate-Ethinyl Estrad-FE (BLISOVI 24 FE) 1-20 MG-MCG(24) tablet, Take 1 tablet by mouth daily., Disp: 28 tablet, Rfl: 6   rosuvastatin (CRESTOR) 40 MG tablet, Take 1 tablet (40 mg total) by mouth daily., Disp: 90 tablet, Rfl: 3   Medications ordered in this encounter:  No orders of the defined types were placed in this encounter.    *If you need refills on other medications prior to your next appointment, please contact your pharmacy*  Follow-Up: Call back or seek an in-person evaluation if the symptoms worsen or if the condition fails to improve as anticipated.  Other Instructions Please keep well-hydrated. Avoid chewing on affected side.  Alternate Tylenol and Ibuprofen as needed for pain. Cold compresses can be beneficial. Take the antibiotic as directed with food. Follow-up with your dental provider as scheduled.  If anything worsens, you develop fever or other new symptoms while on antibiotic -- you need to be evaluated at nearest Urgent Care.    If you have been instructed to have an in-person evaluation today at a local Urgent Care facility, please use the link below. It will take you to a list of all of our available [facility], including address, phone number and hours of operation. Please do not delay care.  [facility]  If you or a family member do not have a primary care provider, use the link below to schedule a visit and establish care. When you choose a [facility] primary care physician or advanced practice provider, you gain a long-term partner in health. Find a Primary Care Provider  Learn more about [facility]'s in-office and virtual care options: [facility] - Get Care Now

## 2020-12-23 ENCOUNTER — Ambulatory Visit: Payer: Medicaid Other | Admitting: Gastroenterology

## 2020-12-25 ENCOUNTER — Telehealth: Payer: BC Managed Care – PPO | Admitting: Family

## 2020-12-25 DIAGNOSIS — K047 Periapical abscess without sinus: Secondary | ICD-10-CM | POA: Diagnosis not present

## 2020-12-25 DIAGNOSIS — K0889 Other specified disorders of teeth and supporting structures: Secondary | ICD-10-CM | POA: Diagnosis not present

## 2020-12-25 MED ORDER — CLINDAMYCIN HCL 300 MG PO CAPS: 300.0000 mg | ORAL_CAPSULE | Freq: Three times a day (TID) | ORAL | 0 refills | Status: AC

## 2020-12-25 NOTE — Progress Notes (Signed)
Virtual Visit Consent   [name], you are scheduled for a virtual visit with a [facility] provider today.     Just as with appointments in the office, your consent must be obtained to participate.  Your consent will be active for this visit and any virtual visit you may have with one of our providers in the next 365 days.     If you have a MyChart account, a copy of this consent can be sent to you electronically.  All virtual visits are billed to your insurance company just like a traditional visit in the office.    As this is a virtual visit, video technology does not allow for your provider to perform a traditional examination.  This may limit your provider's ability to fully assess your condition.  If your provider identifies any concerns that need to be evaluated in person or the need to arrange testing (such as labs, EKG, etc.), we will make arrangements to do so.     Although advances in technology are sophisticated, we cannot ensure that it will always work on either your end or our end.  If the connection with a video visit is poor, the visit may have to be switched to a telephone visit.  With either a video or telephone visit, we are not always able to ensure that we have a secure connection.     I need to obtain your verbal consent now.   Are you willing to proceed with your visit today?    Erin Ruiz has provided verbal consent on 12/25/2020 for a virtual visit (video or telephone).   Jannifer Rodney, FNP   Date: 12/25/2020 11:46 AM   Virtual Visit via Video Note   I, Jannifer Rodney, connected with  Erin Ruiz  ([id number], 01-Jun-1990) on 12/25/20 at 11:30 AM EDT by a video-enabled telemedicine application and verified that I am speaking with the correct person using two identifiers.  Location: Patient: Virtual Visit Location Patient: Home Provider: Virtual Visit Location Provider: Home   I discussed the limitations of evaluation and management by telemedicine  and the availability of in person appointments. The patient expressed understanding and agreed to proceed.    History of Present Illness: Erin Ruiz is a 30 y.o. who identifies as a female who was assigned female at birth, and is being seen today for dental abscess. She had a video visit on 12/09/20 for the same abscess. She received Augmentin for 7 days and completed.  She saw her dentist on 12/15/20 and was referral dental surgeon. She needed $250 up front to see them. She did not have that money, so she made an appointment to have it pulled 12/29/20. However, the area on her left lower gums started swelling on  12/23/20 with tenderness. She reports the swelling and pain is worse.   HPI: HPI  Problems:  Patient Active Problem List   Diagnosis Date Noted   Anxiety and depression 03/20/2019   SAB (spontaneous abortion) 12/26/2018    Allergies: No Known Allergies Medications:  Current Outpatient Medications:    clindamycin (CLEOCIN) 300 MG capsule, Take 1 capsule (300 mg total) by mouth 3 (three) times daily for 10 days., Disp: 30 capsule, Rfl: 0   BIOTIN MAXIMUM PO, Take 1 tablet by mouth daily., Disp: , Rfl:    buPROPion (WELLBUTRIN SR) 150 MG 12 hr tablet, TAKE 1 TABLET BY MOUTH TWICE A DAY, Disp: 180 tablet, Rfl: 0   escitalopram (LEXAPRO) 20 MG tablet, Take 1 tablet (  20 mg total) by mouth daily., Disp: 90 tablet, Rfl: 1   Norethindrone Acetate-Ethinyl Estrad-FE (BLISOVI 24 FE) 1-20 MG-MCG(24) tablet, Take 1 tablet by mouth daily., Disp: 28 tablet, Rfl: 6   rosuvastatin (CRESTOR) 40 MG tablet, Take 1 tablet (40 mg total) by mouth daily., Disp: 90 tablet, Rfl: 3  Observations/Objective: Patient is well-developed, well-nourished in no acute distress.  Resting comfortably  at home.  Head is normocephalic, atraumatic.  No labored breathing.  Speech is clear and coherent with logical content.  Patient is alert and oriented at baseline.  Right lower gum swollen   Assessment and  Plan: 1. Dental abscess - clindamycin (CLEOCIN) 300 MG capsule; Take 1 capsule (300 mg total) by mouth 3 (three) times daily for 10 days.  Dispense: 30 capsule; Refill: 0  2. Pain, dental - clindamycin (CLEOCIN) 300 MG capsule; Take 1 capsule (300 mg total) by mouth 3 (three) times daily for 10 days.  Dispense: 30 capsule; Refill: 0  Keep dentis appointment  If swelling worsens go to ED Pt swelling or pain does not improve in 24 hours of antibiotics, needs to be seen in person Soft diet Rinse mouth after eating   Follow Up Instructions: I discussed the assessment and treatment plan with the patient. The patient was provided an opportunity to ask questions and all were answered. The patient agreed with the plan and demonstrated an understanding of the instructions.  A copy of instructions were sent to the patient via MyChart unless otherwise noted below.    The patient was advised to call back or seek an in-person evaluation if the symptoms worsen or if the condition fails to improve as anticipated.  Time:  I spent 12 minutes with the patient via telehealth technology discussing the above problems/concerns.    Jannifer Rodney, FNP

## 2020-12-25 NOTE — Patient Instructions (Signed)
Dental Abscess ?A dental abscess is an infection around a tooth that may involve pain, swelling, and a collection of pus, as well as other symptoms. Treatment is important to help with symptoms and to prevent the infection from spreading. ?The general types of dental abscesses are: ?Pulpal abscess. This abscess may form from the inner part of the tooth (pulp). ?Periodontal abscess. This abscess may form from the gum. ?What are the causes? ?This condition is caused by a bacterial infection in or around the tooth. It may result from: ?Severe tooth decay (cavities). ?Trauma to the tooth, such as a broken or chipped tooth. ?What increases the risk? ?This condition is more likely to develop in males. It is also more likely to develop in people who: ?Have cavities. ?Have severe gum disease. ?Eat sugary snacks between meals. ?Use tobacco products. ?Have diabetes. ?Have a weakened disease-fighting system (immune system). ?Do not brush and care for their teeth regularly. ?What are the signs or symptoms? ?Mild symptoms of this condition include: ?Tenderness. ?Bad breath. ?Fever. ?A bitter taste in the mouth. ?Pain in and around the infected tooth. ?Moderate symptoms of this condition include: ?Swollen neck glands. ?Chills. ?Pus drainage. ?Swelling and redness around the infected tooth, in the mouth, or in the face. ?Severe pain in and around the infected tooth. ?Severe symptoms of this condition include: ?Difficulty swallowing. ?Difficulty opening the mouth. ?Nausea. ?Vomiting. ?How is this diagnosed? ?This condition is diagnosed based on: ?Your symptoms and your medical and dental history. ?An examination of the infected tooth. During the exam, your dental care provider may tap on the infected tooth. ?You may also need to have X-rays taken of the affected area. ?How is this treated? ?This condition is treated by getting rid of the infection. This may be done with: ?Antibiotic medicines. These may be used in certain  situations. ?Antibacterial mouth rinse. ?Incision and drainage. This procedure is done by making an incision in the abscess to drain out the pus. Removing pus is the first priority in treating an abscess. ?A root canal. This may be performed to save the tooth. Your dental care provider accesses the visible part of your tooth (crown) with a drill and removes any infected pulp. Then the space is filled and sealed off. ?Tooth extraction. The tooth is pulled out if it cannot be saved by other treatment. ?You may also receive treatment for pain, such as: ?Acetaminophen or NSAIDs. ?Gels that contain a numbing medicine. ?An injection to block the pain near your nerve. ?Follow these instructions at home: ?Medicines ?Take over-the-counter and prescription medicines only as told by your dental care provider. ?If you were prescribed an antibiotic, take it as told by your dental care provider. Do not stop taking the antibiotic even if you start to feel better. ?If you were prescribed a gel that contains a numbing medicine, use it exactly as told in the directions. Do not use these gels for children who are younger than 2 years of age. ?Use an antibacterial mouth rinse as told by your dental care provider. ?General instructions ? ?Gargle with a mixture of salt and water 3-4 times a day or as needed. To make salt water, completely dissolve ?-1 tsp (3-6 g) of salt in 1 cup (237 mL) of warm water. ?Eat a soft diet while your abscess is healing. ?Drink enough fluid to keep your urine pale yellow. ?Do not apply heat to the outside of your mouth. ?Do not use any products that contain nicotine or tobacco. These products   include cigarettes, chewing tobacco, and vaping devices, such as e-cigarettes. If you need help quitting, ask your dental care provider. ?Keep all follow-up visits. This is important. ?How is this prevented? ? ?Excellent dental home care, which includes brushing your teeth every morning and night with fluoride  toothpaste. Floss one time each day. ?Get regularly scheduled dental cleanings. ?Consider having a dental sealant applied on teeth that have deep grooves to prevent cavities. ?Drink fluoridated water regularly. This includes most tap water. Check the label on bottled water to see if it contains fluoride. ?Reduce or eliminate sugary drinks. ?Eat healthy meals and snacks. ?Wear a mouth guard or face shield to protect your teeth while playing sports. ?Contact a health care provider if: ?Your pain is worse and is not helped by medicine. ?You have swelling. ?You see pus around the tooth. ?You have a fever or chills. ?Get help right away if: ?Your symptoms suddenly get worse. ?You have a very bad headache. ?You have problems breathing or swallowing. ?You have trouble opening your mouth. ?You have swelling in your neck or around your eye. ?These symptoms may represent a serious problem that is an emergency. Do not wait to see if the symptoms will go away. Get medical help right away. Call your local emergency services (911 in the [place] Do not drive yourself to the hospital. ?Summary ?A dental abscess is a collection of pus in or around a tooth that results from an infection. ?A dental abscess may result from severe tooth decay, trauma to the tooth, or severe gum disease around a tooth. ?Symptoms include severe pain, swelling, redness, and drainage of pus in and around the infected tooth. ?The first priority in treating a dental abscess is to drain out the pus. Treatment may also involve removing damage inside the tooth (root canal) or extracting the tooth. ?This information is not intended to replace advice given to you by your health care provider. Make sure you discuss any questions you have with your health care provider. ?Document Revised: 04/28/2020 Document Reviewed: 04/28/2020 ?Elsevier Patient Education ? 2022 Elsevier Inc. ? ?

## 2020-12-26 ENCOUNTER — Other Ambulatory Visit: Payer: Self-pay

## 2020-12-26 ENCOUNTER — Ambulatory Visit
Admission: EM | Admit: 2020-12-26 | Discharge: 2020-12-26 | Disposition: A | Discharge status: Home / Self Care | Payer: BC Managed Care – PPO | Attending: Physician Assistant | Admitting: Physician Assistant

## 2020-12-26 DIAGNOSIS — K0889 Other specified disorders of teeth and supporting structures: Secondary | ICD-10-CM

## 2020-12-26 MED ORDER — KETOROLAC TROMETHAMINE 30 MG/ML IJ SOLN
30.0000 mg | Freq: Once | INTRAMUSCULAR | Status: AC
  Administered 2020-12-26: 30 mg via INTRAMUSCULAR

## 2020-12-26 NOTE — ED Provider Notes (Signed)
[facility]    CSN: [id number] Arrival date & time: 12/26/20  1142      History   Chief Complaint Chief Complaint  Patient presents with   Dental Pain    HPI Erin Ruiz is a 30 y.o. female.   Patient here today for evaluation of pain in one of her lower molars that started a few days ago.  She has had a root canal and a crown placed on the same tooth.  She did have virtual visit yesterday and was started on antibiotics but states today the pain is worse.  She reports the pain is a throbbing pain.  She has not had known fever but does report some chills.  She has been taking ibuprofen without significant relief  The history is provided by the patient.  Dental Pain Associated symptoms: no congestion and no fever    Past Medical History:  Diagnosis Date   Depression    Encounter for supervision of other normal pregnancy in first trimester 12/02/2014   Heart murmur    childhood   Hypercholesterolemia    Preeclampsia 06/01/2015    Patient Active Problem List   Diagnosis Date Noted   Anxiety and depression 03/20/2019   SAB (spontaneous abortion) 12/26/2018    Past Surgical History:  Procedure Laterality Date   TONSILLECTOMY  2004   WISDOM TOOTH EXTRACTION      OB History     Gravida  4   Para  3   Term  3   Preterm      AB  1   Living  3      SAB  1   IAB      Ectopic      Multiple  0   Live Births  3            Home Medications    Prior to Admission medications   Medication Sig Start Date End Date Taking? Authorizing Provider  BIOTIN MAXIMUM PO Take 1 tablet by mouth daily.    [provider]  buPROPion [facility] SR) 150 MG 12 hr tablet TAKE 1 TABLET BY MOUTH TWICE A DAY 12/08/20   Claiborne Rigg, NP  clindamycin (CLEOCIN) 300 MG capsule Take 1 capsule (300 mg total) by mouth 3 (three) times daily for 10 days. 12/25/20 01/04/21  Jannifer Rodney A, FNP  escitalopram (LEXAPRO) 20 MG tablet Take 1 tablet (20 mg  total) by mouth daily. 08/30/20   [name], NP  Norethindrone Acetate-Ethinyl Estrad-FE (BLISOVI 24 FE) 1-20 MG-MCG(24) tablet Take 1 tablet by mouth daily. 11/10/20   Adam Phenix, MD  rosuvastatin (CRESTOR) 40 MG tablet Take 1 tablet (40 mg total) by mouth daily. 09/26/20   [name], NP    Family History Family History  Problem Relation Age of Onset   Hypertension Mother    Asthma Father     Social History Social History   Tobacco Use   Smoking status: Former   Smokeless tobacco: Never  Building services engineer Use: Never used  Substance Use Topics   Alcohol use: Yes    Comment: every other week   Drug use: No     Allergies   Patient has no known allergies.   Review of Systems Review of Systems  Constitutional:  Positive for chills. Negative for fever.  HENT:  Positive for dental problem. Negative for congestion.   Eyes:  Negative for discharge and redness.  Respiratory:  Negative for  shortness of breath.     Physical Exam Triage Vital Signs ED Triage Vitals  Enc Vitals Group     BP 12/26/20 1306 (!) 151/78     Pulse Rate 12/26/20 1306 88     Resp 12/26/20 1306 18     Temp 12/26/20 1306 98.7 F (37.1 C)     Temp Source 12/26/20 1306 Oral     SpO2 12/26/20 1306 99 %     Weight --      Height --      Head Circumference --      Peak Flow --      Pain Score 12/26/20 1307 10     Pain Loc --      Pain Edu? --      Excl. in GC? --    No data found.  Updated Vital Signs BP (!) 151/78 (BP Location: Left Arm)   Pulse 88   Temp 98.7 F (37.1 C) (Oral)   Resp 18   LMP 12/06/2020   SpO2 99%      Physical Exam Vitals and nursing note reviewed.  Constitutional:      General: She is not in acute distress.    Appearance: Normal appearance. She is not ill-appearing.  HENT:     Head: Normocephalic and atraumatic.     Mouth/Throat:      Comments: Tooth with mild gingival swelling, darkened border to tooth Eyes:     Conjunctiva/sclera:  Conjunctivae normal.  Cardiovascular:     Rate and Rhythm: Normal rate.  Pulmonary:     Effort: Pulmonary effort is normal.  Neurological:     Mental Status: She is alert.  Psychiatric:        Mood and Affect: Mood normal.        Behavior: Behavior normal.     UC Treatments / Results  Labs (all labs ordered are listed, but only abnormal results are displayed) Labs Reviewed - No data to display  EKG   Radiology No results found.  Procedures Procedures (including critical care time)  Medications Ordered in UC Medications  ketorolac (TORADOL) 30 MG/ML injection 30 mg (30 mg Intramuscular Given 12/26/20 1319)    Initial Impression / Assessment and Plan / UC Course  I have reviewed the triage vital signs and the nursing notes.  Pertinent labs & imaging results that were available during my care of the patient were reviewed by me and considered in my medical decision making (see chart for details).   Toradol injection administered in office and recommended she continue antibiotic therapy. Recommended follow up with dentist as scheduled this week. Encouraged sooner follow up with any further concerns.   Final Clinical Impressions(s) / UC Diagnoses   Final diagnoses:  Pain, dental   Discharge Instructions   None    ED Prescriptions   None    PDMP not reviewed this encounter.   Tomi Bamberger, PA-C 12/26/20 1336

## 2020-12-26 NOTE — ED Triage Notes (Signed)
Pt c/o dental abscess to lt lower tooth since Friday. States has an appt with her dentist on Thursday. States had a virtual visit yesterday and started on antibiotics. States today pain is worse.

## 2021-01-09 ENCOUNTER — Telehealth: Payer: Self-pay

## 2021-01-09 NOTE — Telephone Encounter (Signed)
Copied from CRM [id number]. Topic: General - Call Back - No Documentation >> Jan 09, 2021  9:59 AM Crist Infante wrote: Reason for CRM: pt has a skin tag on her private parts that is bothering her, and itching.  Pt wants to know if michelle can remove?  If not, who would you refer her to?  Please referral patient for skin tag removal.

## 2021-01-11 NOTE — Telephone Encounter (Signed)
Patient aware. Has scheduled OV for 11/17 to evaluate and get referral.

## 2021-01-17 ENCOUNTER — Ambulatory Visit (INDEPENDENT_AMBULATORY_CARE_PROVIDER_SITE_OTHER): Payer: Self-pay | Admitting: *Deleted

## 2021-01-17 NOTE — Telephone Encounter (Signed)
Reason for Disposition  [1] SEVERE back pain (e.g., excruciating, unable to do any normal activities) AND [2] not improved 2 hours after pain medicine  Answer Assessment - Initial Assessment Questions 1. ONSET: "When did the pain begin?"      Pt calling in c/o back pain radiating to her privet area.   This is 2nd week for back pain.  Using ibuprofen and heating pad.  I can't sit down.  2. LOCATION: "Where does it hurt?" (upper, mid or lower back)     My lower back that radiates down into my privet area.   I feel a lot of pressure. No urinary symptoms.   The pain is worse when I go to the bathroom with urination and BMs.  My BMs are not normal. 3. SEVERITY: "How bad is the pain?"  (e.g., Scale 1-10; mild, moderate, or severe)   - MILD (1-3): doesn't interfere with normal activities    - MODERATE (4-7): interferes with normal activities or awakens from sleep    - SEVERE (8-10): excruciating pain, unable to do any normal activities      8    I took ibuprofen at 7:50 AM this morning.   I'm at work and nothing is helping. 4. PATTERN: "Is the pain constant?" (e.g., yes, no; constant, intermittent)      The pain is all the time 5. RADIATION: "Does the pain shoot into your legs or elsewhere?"     Into privet area see above.   This pain is getting worse 6. CAUSE:  "What do you think is causing the back pain?"      Maybe UTI but not sure.   The back pain is in the center and more to left side of my back and radiating to my front.   The back pain is different than the front pain.  The front feels like menstrual cramps.  I'm not pregnant.   I'm on birth control.   Period due next Wed.    I feel my periods have been regular no difference. 7. BACK OVERUSE:  "Any recent lifting of heavy objects, strenuous work or exercise?"     No   I keep thinking it would go away but it's getting worse. 8. MEDICATIONS: "What have you taken so far for the pain?" (e.g., nothing, acetaminophen, NSAIDS)     Ibuprofen and  heating pad.   Hot showers. 9. NEUROLOGIC SYMPTOMS: "Do you have any weakness, numbness, or problems with bowel/bladder control?"     I feel like I could fall from a weakness in my leg when the pain happens.   The left leg.   I readjust and then continue walking. 10. OTHER SYMPTOMS: "Do you have any other symptoms?" (e.g., fever, abdominal pain, burning with urination, blood in urine)       No 11. PREGNANCY: "Is there any chance you are pregnant?" (e.g., yes, no; LMP)       On birth control    Period due next Wed.   So no to pregnancy  Protocols used: Back Pain-A-AH

## 2021-01-17 NOTE — Telephone Encounter (Signed)
Pt called in c/o lower middle back pain more to left side and radiating around to her front abd and privet area.   Denies urinary or bowel problems.   See triage notes.  She has an appt for Thursday with Gwinda Passe, NP but wanted to see if she could be seen sooner.  There were no openings at [facility] so I suggested the urgent care of Primary Care at [facility] which she did not want to go there.   She has decided to go on to the urgent care since the pain is getting worse even with the ibuprofen.  I sent my notes to [facility] for her upcoming appt on Thur.

## 2021-01-19 ENCOUNTER — Ambulatory Visit (INDEPENDENT_AMBULATORY_CARE_PROVIDER_SITE_OTHER): Payer: BC Managed Care – PPO | Admitting: Primary Care

## 2021-02-14 ENCOUNTER — Other Ambulatory Visit (INDEPENDENT_AMBULATORY_CARE_PROVIDER_SITE_OTHER): Payer: Self-pay | Admitting: Primary Care

## 2021-02-15 ENCOUNTER — Encounter (INDEPENDENT_AMBULATORY_CARE_PROVIDER_SITE_OTHER): Payer: Self-pay | Admitting: Primary Care

## 2021-02-15 ENCOUNTER — Ambulatory Visit (INDEPENDENT_AMBULATORY_CARE_PROVIDER_SITE_OTHER): Payer: BC Managed Care – PPO | Admitting: Primary Care

## 2021-02-15 ENCOUNTER — Other Ambulatory Visit: Payer: Self-pay

## 2021-02-15 DIAGNOSIS — U071 COVID-19: Secondary | ICD-10-CM | POA: Diagnosis not present

## 2021-02-15 LAB — SARS CORONAVIRUS 2 (TAT 6-24 HRS): SARS Coronavirus 2: POSITIVE — AB

## 2021-02-15 NOTE — Progress Notes (Signed)
°  [facility]   Telephone Note  I connected with Erin Ruiz, on 02/15/2021 at 11:46 AM by telephone and verified that I am speaking with the correct person using two identifiers.   Consent: I discussed the limitations, risks, security and privacy concerns of performing an evaluation and management service by telephone and the availability of in person appointments. I also discussed with the patient that there may be a patient responsible charge related to this service. The patient expressed understanding and agreed to proceed.   Location of Patient: Home  Location of Provider: [facility]   Persons participating in Telemedicine visit: Erin Coaster,  NP Erin Ruiz , CMA  History of Present Illness: Erin Ruiz is 30 year old female who son was flu positive on 02/13/21 she (patient) thought she also had the flu but tested herself for COVID and home test was positive. Called [facility]: [address], [place], [place] [address] Patient will get PCR there today before 3pm. Confirmation.    Past Medical History:  Diagnosis Date   Depression    Encounter for supervision of other normal pregnancy in first trimester 12/02/2014   Heart murmur    childhood   Hypercholesterolemia    Preeclampsia 06/01/2015   No Known Allergies  Current Outpatient Medications on File Prior to Visit  Medication Sig Dispense Refill   BIOTIN MAXIMUM PO Take 1 tablet by mouth daily.     buPROPion (WELLBUTRIN SR) 150 MG 12 hr tablet TAKE 1 TABLET BY MOUTH TWICE A DAY 180 tablet 0   escitalopram (LEXAPRO) 20 MG tablet Take 1 tablet (20 mg total) by mouth daily. 90 tablet 1   Norethindrone Acetate-Ethinyl Estrad-FE (BLISOVI 24 FE) 1-20 MG-MCG(24) tablet Take 1 tablet by mouth daily. 28 tablet 6   rosuvastatin (CRESTOR) 40 MG tablet Take 1 tablet (40 mg total) by mouth daily. 90  tablet 3   No current facility-administered medications on file prior to visit.    Observations/Objective: Review of Systems  Constitutional:  Positive for malaise/fatigue.  Respiratory:  Positive for cough.   All other systems reviewed and are negative.   Assessment and Plan: Erin Ruiz was seen today for covid positive.  Diagnoses and all orders for this visit:  COVID-19 virus infection 2 home positive test sent/ gave information for PCR testing . Spoke with Erin Ruiz staff name given . Patient will be there before 3 pm for confirmation. Called back unable to test for the flu.    Follow Up Instructions: Keep previous appt   I discussed the assessment and treatment plan with the patient. The patient was provided an opportunity to ask questions and all were answered. The patient agreed with the plan and demonstrated an understanding of the instructions.   The patient was advised to call back or seek an in-person evaluation if the symptoms worsen or if the condition fails to improve as anticipated.     I provided 15 minutes total of non-face-to-face time during this encounter including median intraservice time, reviewing previous notes, investigations, ordering medications, medical decision making, coordinating care and patient verbalized understanding at the end of the visit.    This note has been created with Education officer, environmental. Any transcriptional errors are unintentional.   [name], NP 02/15/2021, 11:46 AM

## 2021-02-15 NOTE — Progress Notes (Signed)
I connected with  Erin Ruiz on 02/15/21 by a video enabled telemedicine application and verified that I am speaking with the correct person using two identifiers.   I discussed the limitations of evaluation and management by telemedicine. The patient expressed understanding and agreed to proceed.    Pt son is positive for flu Pt took 2 at home covid tests about 20 minutes ago. Both positive

## 2021-02-22 ENCOUNTER — Ambulatory Visit (INDEPENDENT_AMBULATORY_CARE_PROVIDER_SITE_OTHER): Payer: Medicaid Other | Admitting: Primary Care

## 2021-03-05 ENCOUNTER — Other Ambulatory Visit (INDEPENDENT_AMBULATORY_CARE_PROVIDER_SITE_OTHER): Payer: Self-pay | Admitting: Primary Care

## 2021-03-05 DIAGNOSIS — F32A Depression, unspecified: Secondary | ICD-10-CM

## 2021-03-05 DIAGNOSIS — F419 Anxiety disorder, unspecified: Secondary | ICD-10-CM

## 2021-03-06 ENCOUNTER — Other Ambulatory Visit (INDEPENDENT_AMBULATORY_CARE_PROVIDER_SITE_OTHER): Payer: Self-pay | Admitting: Nurse Practitioner

## 2021-03-06 DIAGNOSIS — F32A Depression, unspecified: Secondary | ICD-10-CM

## 2021-03-06 DIAGNOSIS — F419 Anxiety disorder, unspecified: Secondary | ICD-10-CM

## 2021-04-04 ENCOUNTER — Other Ambulatory Visit: Payer: Self-pay

## 2021-04-04 ENCOUNTER — Ambulatory Visit (INDEPENDENT_AMBULATORY_CARE_PROVIDER_SITE_OTHER): Payer: BC Managed Care – PPO | Admitting: Primary Care

## 2021-04-04 ENCOUNTER — Encounter (INDEPENDENT_AMBULATORY_CARE_PROVIDER_SITE_OTHER): Payer: Self-pay | Admitting: Primary Care

## 2021-04-04 VITALS — BP 119/77 | HR 85 | Temp 98.5°F | Ht 64.0 in | Wt 186.0 lb

## 2021-04-04 DIAGNOSIS — Z79899 Other long term (current) drug therapy: Secondary | ICD-10-CM | POA: Diagnosis not present

## 2021-04-04 DIAGNOSIS — F419 Anxiety disorder, unspecified: Secondary | ICD-10-CM

## 2021-04-04 DIAGNOSIS — F32A Depression, unspecified: Secondary | ICD-10-CM

## 2021-04-04 MED ORDER — DESVENLAFAXINE SUCCINATE ER 50 MG PO TB24: 50.0000 mg | ORAL_TABLET | Freq: Every day | ORAL | 0 refills | Status: DC

## 2021-04-04 NOTE — Progress Notes (Signed)
Pt is fasting  Denies pain  Has concerns about skin tags on neck

## 2021-04-04 NOTE — Patient Instructions (Addendum)
Lexapro is 20 mg start taking this every other day for 1 week. Week 2 take 1/2  Lexapro every other day then stop. Take Wellbutrin 150 mg daily the first week. The 2nd week take  150mg  every other day. Then start and take wellbutrin 150 first week 2nd take 1/2 lexapro EOD and Wellbutrin EOD then start Prisqie 50mg  daily.

## 2021-04-04 NOTE — Progress Notes (Signed)
Erin Ruiz, is a 31 y.o. female  AQL:[id number]  TEL:[id number]  DOB - 1990-10-01  Chief Complaint  Patient presents with   Depression   Weight Loss       Subjective:   Mrs.Erin Ruiz is a 31 y.o. female here today for a follow up visit for depression- she does not feel like the medication is effective . She has described several situation which has led her to believe a separation is best for her and her children. Actually the oldest daughter understands and feels the same way. Her major concern is her 60 y/o son. Patient has No headache, No chest pain, No abdominal pain - No Nausea, No new weakness tingling or numbness, No Cough - SOB.  No problems updated.  ALLERGIES: No Known Allergies  PAST MEDICAL HISTORY: Past Medical History:  Diagnosis Date   Depression    Encounter for supervision of other normal pregnancy in first trimester 12/02/2014   Heart murmur    childhood   Hypercholesterolemia    Preeclampsia 06/01/2015    MEDICATIONS AT HOME: Prior to Admission medications   Medication Sig Start Date End Date Taking? Authorizing Provider  BIOTIN MAXIMUM PO Take 1 tablet by mouth daily.   Yes [provider]  buPROPion (WELLBUTRIN SR) 150 MG 12 hr tablet TAKE 1 TABLET BY MOUTH TWICE A DAY 03/07/21  Yes Kerin Perna, NP  escitalopram (LEXAPRO) 20 MG tablet TAKE 1 TABLET BY MOUTH EVERY DAY 03/06/21  Yes Kerin Perna, NP  Norethindrone Acetate-Ethinyl Estrad-FE (BLISOVI 24 FE) 1-20 MG-MCG(24) tablet Take 1 tablet by mouth daily. 11/10/20  Yes Woodroe Mode, MD  rosuvastatin (CRESTOR) 40 MG tablet Take 1 tablet (40 mg total) by mouth daily. 09/26/20  Yes Kerin Perna, NP    Objective:   Vitals:   04/04/21 0836  BP: 119/77  Pulse: 85  Temp: 98.5 F (36.9 C)  TempSrc: Oral  SpO2: 94%  Weight: 186 lb (84.4 kg)  Height: 5' 4"  (1.626 m)   Exam General appearance : Awake, alert, not in any distress.  Speech Clear. Not toxic looking HEENT: Atraumatic and Normocephalic, pupils equally reactive to light and accomodation Neck: Supple, no JVD. No cervical lymphadenopathy.  Chest: Good air entry bilaterally, no added sounds  CVS: S1 S2 regular, no murmurs.  Abdomen: Bowel sounds present, Non tender and not distended with no gaurding, rigidity or rebound. Extremities: B/L Lower Ext shows no edema, both legs are warm to touch Neurology: Awake alert, and oriented X 3, CN II-XII intact, Non focal Skin: No Rash  Data Review Lab Results  Component Value Date   HGBA1C 5.2 08/30/2020   HGBA1C 5.30 03/03/2014    Assessment & Plan   1. Anxiety and depression Currently on Lexapro is 20 mg start taking this every other day for 1 week. Week 2 take 1/2  Lexapro every other day then stop. Take Wellbutrin 150 mg daily the first week. The 2nd week take  138m every other day. Then start and take wellbutrin 150 first week 2nd take 1/2 lexapro EOD and Wellbutrin EOD then start Prisqie 590mdaily. - Ambulatory referral to Psychiatry  2. Medication management - CMP14+EGFR  Patient have been counseled extensively about nutrition and exercise. Other issues discussed during this visit include: low cholesterol diet, weight control and daily exercise, foot care, annual eye examinations at Ophthalmology, importance of adherence with medications and regular follow-up. We also discussed long term complications of uncontrolled diabetes  and hypertension.   Return in about 3 months (around 07/05/2021) for weight.  The patient was given clear instructions to go to ER or return to medical center if symptoms don't improve, worsen or new problems develop. The patient verbalized understanding. The patient was told to call to get lab results if they haven't heard anything in the next week.   This note has been created with Surveyor, quantity. Any transcriptional errors are  unintentional.   Kerin Perna, NP 04/04/2021, 1:27 PM

## 2021-04-05 LAB — CMP14+EGFR
ALT: 14 IU/L (ref 0–32)
AST: 17 IU/L (ref 0–40)
Albumin/Globulin Ratio: 2.4 — ABNORMAL HIGH (ref 1.2–2.2)
Albumin: 4.7 g/dL (ref 3.9–5.0)
Alkaline Phosphatase: 63 IU/L (ref 44–121)
BUN/Creatinine Ratio: 12 (ref 9–23)
BUN: 9 mg/dL (ref 6–20)
Bilirubin Total: 0.4 mg/dL (ref 0.0–1.2)
CO2: 23 mmol/L (ref 20–29)
Calcium: 9.2 mg/dL (ref 8.7–10.2)
Chloride: 104 mmol/L (ref 96–106)
Creatinine, Ser: 0.75 mg/dL (ref 0.57–1.00)
Globulin, Total: 2 g/dL (ref 1.5–4.5)
Glucose: 84 mg/dL (ref 70–99)
Potassium: 4.4 mmol/L (ref 3.5–5.2)
Sodium: 141 mmol/L (ref 134–144)
Total Protein: 6.7 g/dL (ref 6.0–8.5)
eGFR: 110 mL/min/{1.73_m2} (ref >59)

## 2021-04-06 ENCOUNTER — Other Ambulatory Visit: Payer: Self-pay | Admitting: Obstetrics & Gynecology

## 2021-04-11 ENCOUNTER — Other Ambulatory Visit: Payer: Self-pay | Admitting: *Deleted

## 2021-04-11 DIAGNOSIS — Z30011 Encounter for initial prescription of contraceptive pills: Secondary | ICD-10-CM

## 2021-04-11 MED ORDER — BLISOVI 24 FE 1-20 MG-MCG(24) PO TABS: 1.0000 | ORAL_TABLET | Freq: Every day | ORAL | 6 refills | Status: DC

## 2021-04-11 NOTE — Progress Notes (Signed)
Patient called requesting refills OCP's. Annual exam Sept 2022. RX sent to cover until next annual exam.

## 2021-04-26 ENCOUNTER — Ambulatory Visit (INDEPENDENT_AMBULATORY_CARE_PROVIDER_SITE_OTHER): Payer: Self-pay | Admitting: *Deleted

## 2021-04-26 NOTE — Telephone Encounter (Signed)
Per agent:  "Pt passed out today because her son had to get staples in his head, she still reports not feeling well. Very tired, nauseated ."  Best contact: [phone]    Chief Complaint: PAssed out Symptoms: Dizziness, sweating watching son in ED get staples in head Frequency: This afternoon Pertinent Negatives: Patient denies fever, palpitations Disposition: [] ED /[x] Urgent Care (no appt availability in office) / [] Appointment(In office/virtual)/ []  [facility]/ [] Home Care/ [] Refused Recommended Disposition /[]  Mobile Bus/ []  Follow-up with PCP Additional Notes: Remains dizzy, nauseated. Has been hydrating. Worsens going from sitting to standing. "Feel off, not myself." States was out 10 seconds.      Reason for Disposition  [1] Dizziness caused by heat exposure, sudden standing, or poor fluid intake AND [2] no improvement after 2 hours of rest and fluids  Answer Assessment - Initial Assessment Questions 1. DESCRIPTION: "Describe your dizziness."     Floaty 2. LIGHTHEADED: "Do you feel lightheaded?" (e.g., somewhat faint, woozy, weak upon standing)     yes 3. VERTIGO: "Do you feel like either you or the room is spinning or tilting?" (i.e. vertigo)     No 4. SEVERITY: "How bad is it?"  "Do you feel like you are going to faint?" "Can you stand and walk?"   - MILD: Feels slightly dizzy, but walking normally.   - MODERATE: Feels unsteady when walking, but not falling; interferes with normal activities (e.g., school, work).   - SEVERE: Unable to walk without falling, or requires assistance to walk without falling; feels like passing out now.      mild 5. ONSET:  "When did the dizziness begin?"     Passed out watching son get staples in head injury 6. AGGRAVATING FACTORS: "Does anything make it worse?" (e.g., standing, change in head position)     Sitting to standing 7. HEART RATE: "Can you tell me your heart rate?" "How many beats in 15 seconds?"  (Note: not  all patients can do this)        8. CAUSE: "What do you think is causing the dizziness?"     Unsure 9. RECURRENT SYMPTOM: "Have you had dizziness before?" If Yes, ask: "When was the last time?" "What happened that time?"     Yes with childbirth 10. OTHER SYMPTOMS: "Do you have any other symptoms?" (e.g., fever, chest pain, vomiting, diarrhea, bleeding)       Sweating at time passed out  Protocols used: Dizziness - Lightheadedness-A-AH

## 2021-04-27 NOTE — Telephone Encounter (Signed)
FYI

## 2021-05-01 ENCOUNTER — Encounter (INDEPENDENT_AMBULATORY_CARE_PROVIDER_SITE_OTHER): Payer: Self-pay | Admitting: Primary Care

## 2021-05-15 ENCOUNTER — Telehealth (INDEPENDENT_AMBULATORY_CARE_PROVIDER_SITE_OTHER): Payer: Self-pay

## 2021-05-15 NOTE — Telephone Encounter (Signed)
Copied from CRM #[id number]. Topic: Referral - Request for Referral ?>> May 15, 2021 12:41 PM Gaetana Michaelis A wrote: ?Has patient seen PCP for this complaint? No. ?*If NO, is insurance requiring patient see PCP for this issue before PCP can refer them? ?Referral for which specialty: Vascular Specialist  ?Preferred provider/office: [facility]  ?Reason for referral: patient has varicose vein concerns. ? ?She is also following up on a request for referral to dermatology. Braxon Suder Budd Palmer, CMA D ?

## 2021-05-18 ENCOUNTER — Other Ambulatory Visit (HOSPITAL_COMMUNITY): Payer: Self-pay

## 2021-05-24 ENCOUNTER — Ambulatory Visit: Payer: BC Managed Care – PPO | Admitting: Obstetrics

## 2021-05-30 ENCOUNTER — Other Ambulatory Visit: Payer: Self-pay

## 2021-05-30 ENCOUNTER — Ambulatory Visit (INDEPENDENT_AMBULATORY_CARE_PROVIDER_SITE_OTHER): Payer: BC Managed Care – PPO

## 2021-05-30 ENCOUNTER — Other Ambulatory Visit (HOSPITAL_COMMUNITY)
Admission: RE | Admit: 2021-05-30 | Discharge: 2021-05-30 | Disposition: A | Discharge status: Home / Self Care | Payer: BC Managed Care – PPO | Source: Ambulatory Visit | Attending: Obstetrics | Admitting: Obstetrics

## 2021-05-30 DIAGNOSIS — N898 Other specified noninflammatory disorders of vagina: Secondary | ICD-10-CM

## 2021-05-30 NOTE — Progress Notes (Signed)
..  SUBJECTIVE:  ?31 y.o. female complains of white and odorless vaginal discharge for 10 day(s). Also reports vaginal itching. ?Denies abnormal vaginal bleeding or significant pelvic pain or ?fever. No UTI symptoms. Denies history of known exposure to STD. ? ? ?OBJECTIVE:  ?She appears well, afebrile. ?Urine dipstick: not done. ? ?ASSESSMENT:  ?Vaginal Discharge, itching  ?Denies Vaginal Odor ? ? ?PLAN:  ?GC, chlamydia, trichomonas, BVAG, CVAG probe sent to lab. ?Treatment: To be determined once lab results are received ?ROV prn if symptoms persist or worsen. ? ?

## 2021-05-30 NOTE — Progress Notes (Signed)
Patient was assessed and managed by nursing staff during this encounter. I have reviewed the chart and agree with the documentation and plan. I have also made any necessary editorial changes. ? ?Zeffie Bickert, MD ?05/30/2021 5:04 PM  ? ?

## 2021-05-31 LAB — CERVICOVAGINAL ANCILLARY ONLY
Bacterial Vaginitis (gardnerella): NEGATIVE
Candida Glabrata: NEGATIVE
Candida Vaginitis: NEGATIVE
Chlamydia: NEGATIVE
Comment: NEGATIVE
Comment: NEGATIVE
Comment: NEGATIVE
Comment: NEGATIVE
Comment: NEGATIVE
Comment: NORMAL
Neisseria Gonorrhea: NEGATIVE
Trichomonas: NEGATIVE

## 2021-06-13 ENCOUNTER — Telehealth: Payer: BC Managed Care – PPO | Admitting: Physician Assistant

## 2021-06-13 ENCOUNTER — Other Ambulatory Visit (INDEPENDENT_AMBULATORY_CARE_PROVIDER_SITE_OTHER): Payer: Self-pay | Admitting: Primary Care

## 2021-06-13 DIAGNOSIS — L700 Acne vulgaris: Secondary | ICD-10-CM

## 2021-06-13 DIAGNOSIS — F419 Anxiety disorder, unspecified: Secondary | ICD-10-CM

## 2021-06-13 MED ORDER — BENZOYL PEROXIDE WASH 5 % EX LIQD: Freq: Two times a day (BID) | CUTANEOUS | 12 refills | Status: AC

## 2021-06-13 NOTE — Progress Notes (Signed)
[name], Erin Ruiz are scheduled for a virtual visit with your provider today.   ? ?Just as we do with appointments in the office, we must obtain your consent to participate.  Your consent will be active for this visit and any virtual visit you may have with one of our providers in the next 365 days.   ? ?If you have a MyChart account, I can also send a copy of this consent to you electronically.  All virtual visits are billed to your insurance company just like a traditional visit in the office.  As this is a virtual visit, video technology does not allow for your provider to perform a traditional examination.  This may limit your provider's ability to fully assess your condition.  If your provider identifies any concerns that need to be evaluated in person or the need to arrange testing such as labs, EKG, etc, we will make arrangements to do so.   ? ?Although advances in technology are sophisticated, we cannot ensure that it will always work on either your end or our end.  If the connection with a video visit is poor, we may have to switch to a telephone visit.  With either a video or telephone visit, we are not always able to ensure that we have a secure connection.   I need to obtain your verbal consent now.   Are you willing to proceed with your visit today?  ? ?Erin Ruiz has provided verbal consent on 06/13/2021 for a virtual visit (video or telephone). ? ? ?Erin Belue Manfred Shirts, PA-C ?06/13/2021  9:27 AM ? ? ?Date:  06/13/2021  ? ?ID:  [name], DOB 1990-05-25, MRN [id number] ? ?Patient Location: Home ?Provider Location: Home Office ? ? ?Participants: Patient and Provider for Visit and Wrap up ? ?Method of visit: Video  ?Location of Patient: [place] ?Location of Provider: Home Office ?Consent was obtain for visit over the video. ?Services rendered by provider: Visit was performed via video ? ?A video enabled telemedicine application was used and I verified that I am speaking with the correct person using two  identifiers. ? ?PCP:  [name], NP  ? ?Chief Complaint:  acne ? ?History of Present Illness:   ? ?Erin Ruiz is a 31 y.o. female with history as stated below. ?Presents video telehealth for an acute care visit ? ?Onset of symptoms was 1 week ago and symptoms have been persistent and include:     ? ?Acne noted to the right side of the face. Pt currently on vacation, has been in the hot tub. Denies any other changes in skin can routine recently. Pimples are tender to touch and erythematous. No reported systemic sxs. ?Past Medical, Surgical, Social History, Allergies, and Medications have been Reviewed. ? ?Past Medical History:  ?Diagnosis Date  ? Depression   ? Encounter for supervision of other normal pregnancy in first trimester 12/02/2014  ? Heart murmur   ? childhood  ? Hypercholesterolemia   ? Preeclampsia 06/01/2015  ? ? ?Current Meds  ?Medication Sig  ? benzoyl peroxide 5 % external liquid Apply topically 2 (two) times daily.  ?  ? ?Allergies:   Patient has no known allergies.  ? ?ROS ?See HPI for history of present illness. ? ?Physical Exam ?Constitutional:   ?   Appearance: Normal appearance. She is obese.  ?Skin: ?   Comments: Acne vulgaris noted to the right cheek and along the right side of the mandible, no associated cellulitis  ?Neurological:  ?  Mental Status: She is alert.  ? ?        ?MDM: Pt c/o acne vulgaris. Recommended benzoyl peroxide, rx given.    ? ?There are no diagnoses linked to this encounter. ? ? ?Time:   ?Today, I have spent 10 minutes with the patient with telehealth technology discussing the above problems, reviewing the chart, previous notes, medications and orders.  ? ? ?Tests Ordered: ?No orders of the defined types were placed in this encounter. ? ? ?Medication Changes: ?Meds ordered this encounter  ?Medications  ? benzoyl peroxide 5 % external liquid  ?  Sig: Apply topically 2 (two) times daily.  ?  Dispense:  142 g  ?  Refill:  12  ?  Order Specific Question:    Supervising Provider  ?  Answer:   Eber Hong [[id number]]  ? ? ? ?Disposition:  Follow up  ?Signed, ?Corene Resnick Manfred Shirts, PA-C  ?06/13/2021 9:27 AM    ? ?

## 2021-06-13 NOTE — Telephone Encounter (Signed)
Sent to PCP ?

## 2021-06-13 NOTE — Patient Instructions (Signed)
?  [name], thank you for joining Karrie Meres, PA-C for today's virtual visit.  While this provider is not your primary care provider (PCP), if your PCP is located in our provider database this encounter information will be shared with them immediately following your visit. ? ?Consent: ?(Patient) Erin Ruiz provided verbal consent for this virtual visit at the beginning of the encounter. ? ?Current Medications: ? ?Current Outpatient Medications:  ?  benzoyl peroxide 5 % external liquid, Apply topically 2 (two) times daily., Disp: 142 g, Rfl: 12 ?  BIOTIN MAXIMUM PO, Take 1 tablet by mouth daily. (Patient not taking: Reported on 05/30/2021), Disp: , Rfl:  ?  buPROPion (WELLBUTRIN SR) 150 MG 12 hr tablet, Take 150 mg by mouth 2 (two) times daily., Disp: , Rfl:  ?  desvenlafaxine (PRISTIQ) 50 MG 24 hr tablet, Take 1 tablet (50 mg total) by mouth daily. (Patient not taking: Reported on 05/30/2021), Disp: 90 tablet, Rfl: 0 ?  escitalopram (LEXAPRO) 20 MG tablet, Take 20 mg by mouth daily., Disp: , Rfl:  ?  Norethindrone Acetate-Ethinyl Estrad-FE (BLISOVI 24 FE) 1-20 MG-MCG(24) tablet, Take 1 tablet by mouth daily., Disp: 28 tablet, Rfl: 6 ?  rosuvastatin (CRESTOR) 40 MG tablet, Take 1 tablet (40 mg total) by mouth daily. (Patient not taking: Reported on 05/30/2021), Disp: 90 tablet, Rfl: 3  ? ?Medications ordered in this encounter:  ?Meds ordered this encounter  ?Medications  ? benzoyl peroxide 5 % external liquid  ?  Sig: Apply topically 2 (two) times daily.  ?  Dispense:  142 g  ?  Refill:  12  ?  Order Specific Question:   Supervising Provider  ?  Answer:   Eber Hong [[id number]]  ?  ? ?*If you need refills on other medications prior to your next appointment, please contact your pharmacy* ? ?Follow-Up: ?Call back or seek an in-person evaluation if the symptoms worsen or if the condition fails to improve as anticipated. ? ?Other Instructions ?Use the benzoyl peroxide as directed  ? ?Follow up with your  regular doctor in 1 week for reassessment and seek care sooner if your symptoms worsen or fail to improve. ? ? ?If you have been instructed to have an in-person evaluation today at a local Urgent Care facility, please use the link below. It will take you to a list of all of our available [facility], including address, phone number and hours of operation. Please do not delay care.  ?[facility] ? ?If you or a family member do not have a primary care provider, use the link below to schedule a visit and establish care. When you choose a [facility] primary care physician or advanced practice provider, you gain a long-term partner in health. ?Find a Primary Care Provider ? ?Learn more about [facility]'s in-office and virtual care options: ?[facility] - Get Care Now ? ?

## 2021-06-15 ENCOUNTER — Ambulatory Visit (INDEPENDENT_AMBULATORY_CARE_PROVIDER_SITE_OTHER): Payer: Self-pay

## 2021-06-15 ENCOUNTER — Encounter (HOSPITAL_COMMUNITY): Payer: Self-pay

## 2021-06-15 ENCOUNTER — Ambulatory Visit (HOSPITAL_COMMUNITY)
Admission: EM | Admit: 2021-06-15 | Discharge: 2021-06-15 | Disposition: A | Discharge status: Home / Self Care | Payer: BC Managed Care – PPO | Attending: Internal Medicine | Admitting: Internal Medicine

## 2021-06-15 DIAGNOSIS — H538 Other visual disturbances: Secondary | ICD-10-CM

## 2021-06-15 DIAGNOSIS — R112 Nausea with vomiting, unspecified: Secondary | ICD-10-CM | POA: Insufficient documentation

## 2021-06-15 DIAGNOSIS — R42 Dizziness and giddiness: Secondary | ICD-10-CM

## 2021-06-15 DIAGNOSIS — R0789 Other chest pain: Secondary | ICD-10-CM | POA: Diagnosis not present

## 2021-06-15 LAB — CBC WITH DIFFERENTIAL/PLATELET
Abs Immature Granulocytes: 0.02 10*3/uL (ref 0.00–0.07)
Basophils Absolute: 0 10*3/uL (ref 0.0–0.1)
Basophils Relative: 1 %
Eosinophils Absolute: 0.2 10*3/uL (ref 0.0–0.5)
Eosinophils Relative: 3 %
HCT: 43.5 % (ref 36.0–46.0)
Hemoglobin: 14.4 g/dL (ref 12.0–15.0)
Immature Granulocytes: 0 %
Lymphocytes Relative: 32 %
Lymphs Abs: 2.2 10*3/uL (ref 0.7–4.0)
MCH: 30.3 pg (ref 26.0–34.0)
MCHC: 33.1 g/dL (ref 30.0–36.0)
MCV: 91.6 fL (ref 80.0–100.0)
Monocytes Absolute: 0.5 10*3/uL (ref 0.1–1.0)
Monocytes Relative: 7 %
Neutro Abs: 3.9 10*3/uL (ref 1.7–7.7)
Neutrophils Relative %: 57 %
Platelets: 274 10*3/uL (ref 150–400)
RBC: 4.75 MIL/uL (ref 3.87–5.11)
RDW: 12.1 % (ref 11.5–15.5)
WBC: 6.9 10*3/uL (ref 4.0–10.5)
nRBC: 0 % (ref 0.0–0.2)

## 2021-06-15 LAB — CBG MONITORING, ED: Glucose-Capillary: 128 mg/dL — ABNORMAL HIGH (ref 70–99)

## 2021-06-15 LAB — BASIC METABOLIC PANEL
Anion gap: 8 (ref 5–15)
BUN: <5 mg/dL — ABNORMAL LOW (ref 6–20)
CO2: 25 mmol/L (ref 22–32)
Calcium: 9.6 mg/dL (ref 8.9–10.3)
Chloride: 106 mmol/L (ref 98–111)
Creatinine, Ser: 0.77 mg/dL (ref 0.44–1.00)
GFR, Estimated: >60 mL/min (ref >60)
Glucose, Bld: 86 mg/dL (ref 70–99)
Potassium: 3.9 mmol/L (ref 3.5–5.1)
Sodium: 139 mmol/L (ref 135–145)

## 2021-06-15 LAB — VITAMIN D 25 HYDROXY (VIT D DEFICIENCY, FRACTURES): Vit D, 25-Hydroxy: 23.55 ng/mL — ABNORMAL LOW (ref 30–100)

## 2021-06-15 LAB — TSH: TSH: 1.825 u[IU]/mL (ref 0.350–4.500)

## 2021-06-15 MED ORDER — MECLIZINE HCL 25 MG PO TABS
25.0000 mg | ORAL_TABLET | Freq: Three times a day (TID) | ORAL | 0 refills | Status: DC | PRN
Start: 2021-06-15 — End: 2021-11-01

## 2021-06-15 MED ORDER — ONDANSETRON 4 MG PO TBDP
ORAL_TABLET | ORAL | Status: AC
  Filled 2021-06-15: qty 1

## 2021-06-15 MED ORDER — ONDANSETRON 4 MG PO TBDP
4.0000 mg | ORAL_TABLET | Freq: Once | ORAL | Status: AC
  Administered 2021-06-15: 4 mg via ORAL

## 2021-06-15 NOTE — ED Triage Notes (Signed)
Pt presents with c/o nausea, vomiting, right side chest pain and blurred vision X 2 days.  ?States she has some dizziness. ?Pt states it started yesterday evening. Pt currently does not have chest pain, states she had chest pain when she came here to UC 4/10.  ?

## 2021-06-15 NOTE — Telephone Encounter (Signed)
? ? ?  Chief Complaint: Dizziness with vertigo ?Symptoms: Above ?Frequency: Sunday ?Pertinent Negatives: Patient denies  ?Disposition: [] ED /[x] Urgent Care (no appt availability in office) / [] Appointment(In office/virtual)/ []  [facility]/ [] Home Care/ [] Refused Recommended Disposition /[] [facility]/ []  Follow-up with PCP ?Additional Notes:   ?Reason for Disposition ? [1] MODERATE dizziness (e.g., interferes with normal activities) AND [2] has NOT been evaluated by physician for this  (Exception: dizziness caused by heat exposure, sudden standing, or poor fluid intake) ? ?Answer Assessment - Initial Assessment Questions ?1. DESCRIPTION: "Describe your dizziness." ?    Dizzy ?2. LIGHTHEADED: "Do you feel lightheaded?" (e.g., somewhat faint, woozy, weak upon standing) ?    Woozy ?3. VERTIGO: "Do you feel like either you or the room is spinning or tilting?" (i.e. vertigo) ?    Yes ?4. SEVERITY: "How bad is it?"  "Do you feel like you are going to faint?" "Can you stand and walk?" ?  - MILD: Feels slightly dizzy, but walking normally. ?  - MODERATE: Feels unsteady when walking, but not falling; interferes with normal activities (e.g., school, work). ?  - SEVERE: Unable to walk without falling, or requires assistance to walk without falling; feels like passing out now.  ?    Moderate ?5. ONSET:  "When did the dizziness begin?" ?    Last night ?6. AGGRAVATING FACTORS: "Does anything make it worse?" (e.g., standing, change in head position) ?    Walking ?7. HEART RATE: "Can you tell me your heart rate?" "How many beats in 15 seconds?"  (Note: not all patients can do this)   ?    No ?8. CAUSE: "What do you think is causing the dizziness?" ?    Unsure ?9. RECURRENT SYMPTOM: "Have you had dizziness before?" If Yes, ask: "When was the last time?" "What happened that time?" ?    No ?10. OTHER SYMPTOMS: "Do you have any other symptoms?" (e.g., fever, chest pain, vomiting, diarrhea, bleeding) ?       Nausea ?11. PREGNANCY: "Is there any chance you are pregnant?" "When was your last menstrual period?" ?      No ? ?Protocols used: Dizziness - Lightheadedness-A-AH ? ?

## 2021-06-15 NOTE — ED Provider Notes (Signed)
?[facility] ? ? ? ?CSN: [id number] ?Arrival date & time: 06/15/21  1022 ? ? ?  ? ?History   ?Chief Complaint ?Chief Complaint  ?Patient presents with  ? Dizziness  ? Blurred Vision  ? Chest Pain  ?  Right side  ? ? ?HPI ?Erin Ruiz is a 31 y.o. female comes to the urgent care with a 1 day history of blurry vision, dizziness, nausea and vomiting.  Patient's symptoms started last night and has been persistent.  She endorses worsening dizziness, lightheadedness when she stands up from a sitting position or when she moves around.  She has had 1 episode of nonbilious nonbloody vomitus.  No change in weight.  No cold or hot intolerance.  No sore throat, ringing in the ears, difficulty with speech or diarrhea.  No sick contacts.  Symptoms are associated with floaters in her visual field.  This is the second episode that the patient has had in the past month.  She endorses being under a lot of stress recently.  No sleep disturbances.  No rash.  No sore throat, cough or difficulty breathing.  Patient denies any chest pain at this time.  Patient denies any history of migraines. ? ?HPI ? ?Past Medical History:  ?Diagnosis Date  ? Depression   ? Encounter for supervision of other normal pregnancy in first trimester 12/02/2014  ? Heart murmur   ? childhood  ? Hypercholesterolemia   ? Preeclampsia 06/01/2015  ? ? ?Patient Active Problem List  ? Diagnosis Date Noted  ? Anxiety and depression 03/20/2019  ? SAB (spontaneous abortion) 12/26/2018  ? ? ?Past Surgical History:  ?Procedure Laterality Date  ? TONSILLECTOMY  2004  ? WISDOM TOOTH EXTRACTION    ? ? ?OB History   ? ? Gravida  ?4  ? Para  ?3  ? Term  ?3  ? Preterm  ?   ? AB  ?1  ? Living  ?3  ?  ? ? SAB  ?1  ? IAB  ?   ? Ectopic  ?   ? Multiple  ?0  ? Live Births  ?3  ?   ?  ?  ? ? ? ?Home Medications   ? ?Prior to Admission medications   ?Medication Sig Start Date End Date Taking? Authorizing Provider  ?escitalopram (LEXAPRO) 20 MG tablet Take 20 mg by mouth  daily.   Yes [provider]  ?benzoyl peroxide 5 % external liquid Apply topically 2 (two) times daily. 06/13/21   Couture, Cortni S, PA-C  ?BIOTIN MAXIMUM PO Take 1 tablet by mouth daily. ?Patient not taking: Reported on 05/30/2021    [provider]  ?desvenlafaxine (PRISTIQ) 50 MG 24 hr tablet Take 1 tablet (50 mg total) by mouth daily. ?Patient not taking: Reported on 05/30/2021 04/04/21   Kerin Perna, NP  ?Norethindrone Acetate-Ethinyl Estrad-FE (BLISOVI 24 FE) 1-20 MG-MCG(24) tablet Take 1 tablet by mouth daily. 04/11/21   Woodroe Mode, MD  ?rosuvastatin (CRESTOR) 40 MG tablet Take 1 tablet (40 mg total) by mouth daily. ?Patient not taking: Reported on 05/30/2021 09/26/20   Kerin Perna, NP  ? ? ?Family History ?Family History  ?Problem Relation Age of Onset  ? Hypertension Mother   ? Asthma Father   ? ? ?Social History ?Social History  ? ?Tobacco Use  ? Smoking status: Former  ? Smokeless tobacco: Never  ?Vaping Use  ? Vaping Use: Never used  ?Substance Use Topics  ? Alcohol use: Yes  ?  Comment: every other week  ? Drug use: No  ? ? ? ?Allergies   ?Patient has no known allergies. ? ? ?Review of Systems ?Review of Systems ?As per HPI ? ?Physical Exam ?Triage Vital Signs ?ED Triage Vitals  ?Enc Vitals Group  ?   BP 06/15/21 1055 (!) 143/98  ?   Pulse Rate 06/15/21 1055 85  ?   Resp 06/15/21 1055 18  ?   Temp --   ?   Temp Source 06/15/21 1055 Oral  ?   SpO2 06/15/21 1055 98 %  ?   Weight --   ?   Height --   ?   Head Circumference --   ?   Peak Flow --   ?   Pain Score 06/15/21 1050 0  ?   Pain Loc --   ?   Pain Edu? --   ?   Excl. in [place]? --   ? ?Orthostatic VS for the past 24 hrs: ? BP- Lying Pulse- Lying BP- Sitting Pulse- Sitting BP- Standing at 0 minutes Pulse- Standing at 0 minutes  ?06/15/21 1139 118/77 71 124/84 77 127/88 85  ? ? ?Updated Vital Signs ?BP (!) 143/98 (BP Location: Left Arm)   Pulse 85   Resp 18   SpO2 98%  ? ?Visual Acuity ?Right Eye Distance:   ?Left Eye  Distance:   ?Bilateral Distance:   ? ?Right Eye Near:   ?Left Eye Near:    ?Bilateral Near:    ? ?Physical Exam ?Vitals and nursing note reviewed.  ?Constitutional:   ?   General: She is in acute distress.  ?   Appearance: She is not ill-appearing.  ?Neck:  ?   Thyroid: No thyromegaly.  ?Cardiovascular:  ?   Rate and Rhythm: Normal rate and regular rhythm.  ?   Heart sounds: Normal heart sounds.  ?Pulmonary:  ?   Effort: Pulmonary effort is normal.  ?   Breath sounds: No decreased breath sounds, wheezing, rhonchi or rales.  ?Abdominal:  ?   Palpations: Abdomen is soft.  ?Musculoskeletal:  ?   Cervical back: Normal range of motion.  ?Lymphadenopathy:  ?   Cervical: No cervical adenopathy.  ?Neurological:  ?   General: No focal deficit present.  ?   Mental Status: She is alert and oriented to person, place, and time.  ?   Cranial Nerves: No cranial nerve deficit.  ?   Motor: No weakness.  ?Psychiatric:     ?   Mood and Affect: Mood normal.  ? ? ? ?UC Treatments / Results  ?Labs ?(all labs ordered are listed, but only abnormal results are displayed) ?Labs Reviewed  ?CBG MONITORING, ED - Abnormal; Notable for the following components:  ?    Result Value  ? Glucose-Capillary 128 (*)   ? All other components within normal limits  ?CBC WITH DIFFERENTIAL/PLATELET  ?BASIC METABOLIC PANEL  ?TSH  ?VITAMIN D 25 HYDROXY (VIT D DEFICIENCY, FRACTURES)  ? ? ?EKG ? ? ?Radiology ?No results found. ? ?Procedures ?Procedures (including critical care time) ? ?Medications Ordered in UC ?Medications  ?ondansetron (ZOFRAN-ODT) disintegrating tablet 4 mg (4 mg Oral Given 06/15/21 1138)  ? ? ?Initial Impression / Assessment and Plan / UC Course  ?I have reviewed the triage vital signs and the nursing notes. ? ?Pertinent labs & imaging results that were available during my care of the patient were reviewed by me and considered in my medical decision making (see chart for details). ? ?  ? ?  1.  Vertigo with vomiting: ?EKG shows normal sinus  rhythm with no acute ST/T wave changes ?CBC, BMP, TSH and vitamin D level ?Zofran ODT x1 dose ?Meclizine as needed for dizziness ?We will call patient with recommendations if labs are abnormal ?Return precautions given ? ?Final Clinical Impressions(s) / UC Diagnoses  ? ?Final diagnoses:  ?Vertigo  ?Blurred vision, bilateral  ? ? ? ?Discharge Instructions   ? ?  ?Increase oral fluid intake ?Meclizine as needed for dizziness ?If dizziness, blurry vision persist please go to the emergency room for further evaluation ?We will call you with recommendations if labs are abnormal. ? ? ? ? ?ED Prescriptions   ?None ?  ? ?PDMP not reviewed this encounter. ?  ?Chase Picket, MD ?06/15/21 1205 ? ?

## 2021-06-15 NOTE — Discharge Instructions (Addendum)
Increase oral fluid intake ?Meclizine as needed for dizziness ?If dizziness, blurry vision persist please go to the emergency room for further evaluation ?We will call you with recommendations if labs are abnormal. ?

## 2021-06-21 ENCOUNTER — Encounter (INDEPENDENT_AMBULATORY_CARE_PROVIDER_SITE_OTHER): Payer: Self-pay | Admitting: Primary Care

## 2021-06-21 ENCOUNTER — Telehealth (INDEPENDENT_AMBULATORY_CARE_PROVIDER_SITE_OTHER): Payer: Self-pay | Admitting: Primary Care

## 2021-06-21 NOTE — Telephone Encounter (Signed)
Pt called to report that her Lexapro is not helping, she is requesting an alternative Rx.  ? ?[id number] [place], [place] - [address]  ?[address] [place] [address]  ?Phone: [phone] Fax: [phone]  ? ?escitalopram (LEXAPRO) 20 MG tablet ? ? ? ?

## 2021-06-21 NOTE — Telephone Encounter (Signed)
Request for alternative to lexapro routed to PCP.  ?

## 2021-06-21 NOTE — Telephone Encounter (Signed)
It looks like patient has tried sertraline, bupropion, and escitalopram without improvement. Recommend referral to behavioral health as she has insurance.  ?

## 2021-06-22 ENCOUNTER — Other Ambulatory Visit (INDEPENDENT_AMBULATORY_CARE_PROVIDER_SITE_OTHER): Payer: Self-pay | Admitting: Primary Care

## 2021-06-27 ENCOUNTER — Telehealth: Payer: Self-pay

## 2021-06-27 NOTE — Telephone Encounter (Signed)
[facility]- Marcelino Duster is requesting recommendations for behavioral health follow up for this patient.  ?The patient does not want a virtual visit and doesn't want a long wait time.  ? ?Thanks  ? ? ?

## 2021-06-29 NOTE — Telephone Encounter (Signed)
I called this pt and discussed her concerns. She mentioned that she does not want to get off of her lexapro due to concerns that it will make her sick. She does not feel like it's helpful. She stated that she is tired of wasting her time. I did inform her that medication is best treated with therapy. I previously saw this pt however she no showed our last session and pt was not able to reschedule this appt. Pt mentioned that she has a lot going on right now with her separation from her spouse and recently moving into her own place. She stated that she does not have time to be weaned off her medication and is not interested in therapy due to being busy. She was currently at work and could not talk long. I let her know that I will still send her resources and to call back with questions.

## 2021-07-03 ENCOUNTER — Other Ambulatory Visit: Payer: Self-pay | Admitting: Nurse Practitioner

## 2021-07-03 DIAGNOSIS — F419 Anxiety disorder, unspecified: Secondary | ICD-10-CM

## 2021-07-03 NOTE — Telephone Encounter (Signed)
noted 

## 2021-07-05 ENCOUNTER — Other Ambulatory Visit (INDEPENDENT_AMBULATORY_CARE_PROVIDER_SITE_OTHER): Payer: Self-pay | Admitting: Primary Care

## 2021-07-05 ENCOUNTER — Ambulatory Visit (INDEPENDENT_AMBULATORY_CARE_PROVIDER_SITE_OTHER): Payer: BC Managed Care – PPO | Admitting: Primary Care

## 2021-07-05 MED ORDER — FLUOXETINE HCL 20 MG PO TABS: 20.0000 mg | ORAL_TABLET | Freq: Every day | ORAL | 0 refills | Status: DC

## 2021-07-05 NOTE — Telephone Encounter (Signed)
Thank- you. Not much to do if not interested in therapy . There is no other medication I feel comfortable prescribing will ask Dr. Alvis Lemmings for advise Actually cc her. ?

## 2021-07-05 NOTE — Telephone Encounter (Signed)
Per review of her chart it does seem like she did Zoloft 50 mg and 100 mg in the past.  We can see if that was effective for her. ?Another option is Prozac.  She can be switched from Lexapro to one of these and may not necessarily need to be weaned off. ?

## 2021-07-05 NOTE — Telephone Encounter (Signed)
Added Prozac 20mg  and patient to call and schedule 6 week f/u per her work schedule. Zoloft previously was not effective. Patient is in agreement. Doing as well as expected- no suicidal or homicidal thoughts at this time. ?

## 2021-07-10 ENCOUNTER — Encounter (INDEPENDENT_AMBULATORY_CARE_PROVIDER_SITE_OTHER): Payer: Self-pay | Admitting: Primary Care

## 2021-07-27 IMAGING — US US OB < 14 WEEKS - US OB TV
1 series · 15 of 28 positions shown · non-contrast
Comparison: None.

CLINICAL DATA: Bleeding

EXAM:
OBSTETRIC <14 WK US AND TRANSVAGINAL OB US
TECHNIQUE: Both transabdominal and transvaginal ultrasound examinations were
performed for complete evaluation of the gestation as well as the
maternal uterus, adnexal regions, and pelvic cul-de-sac.
Transvaginal technique was performed to assess early pregnancy.

[Series 1: us ob < 14 weeks - us ob tv · 36 acquisitions, 15 frames shown]
[im 1/36]
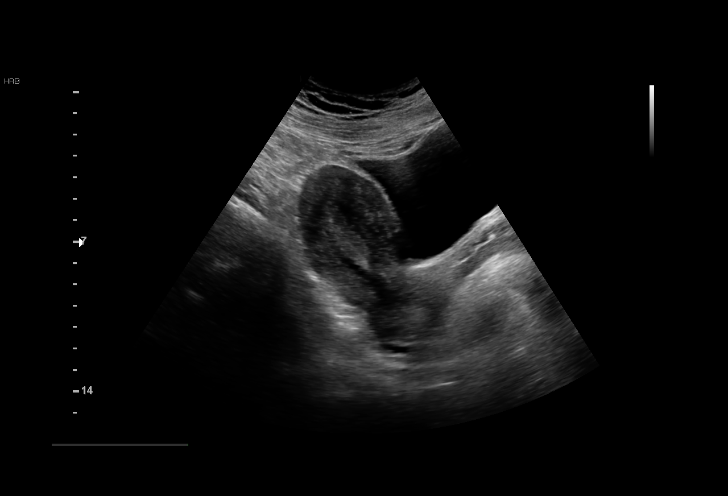
[im 3/36]
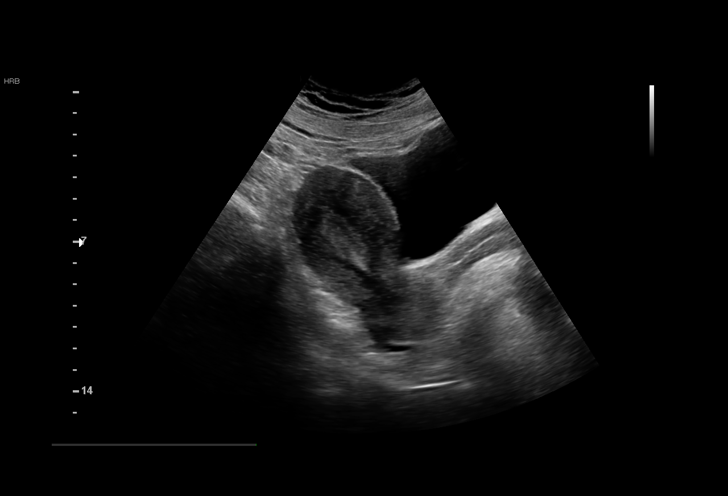
[im 6/36]
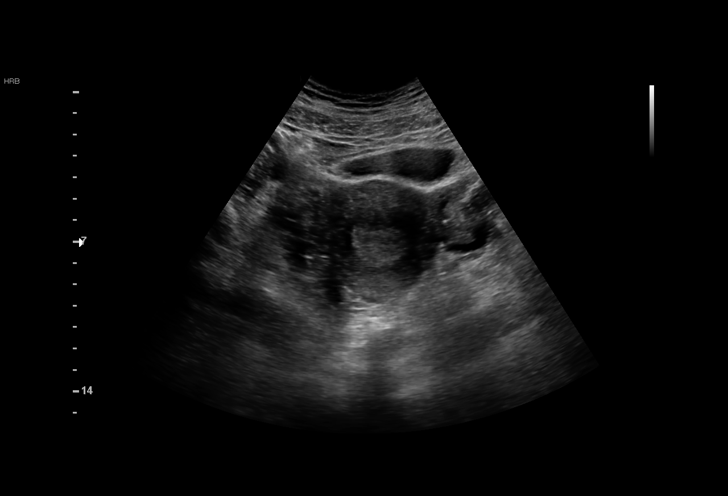
[im 8/36]
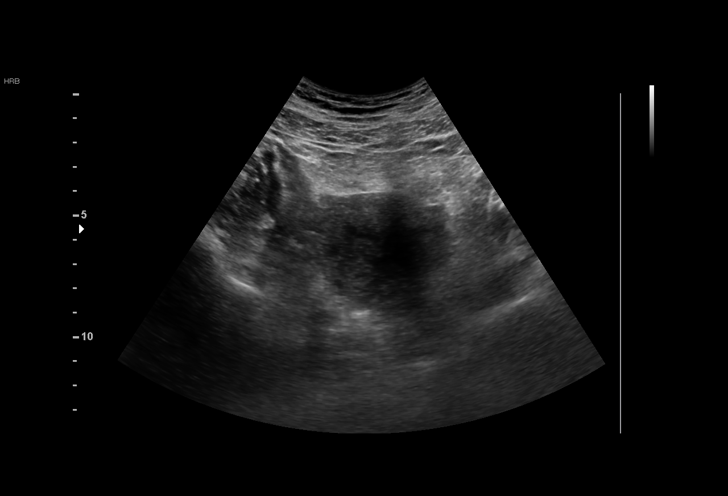
[im 11/36]
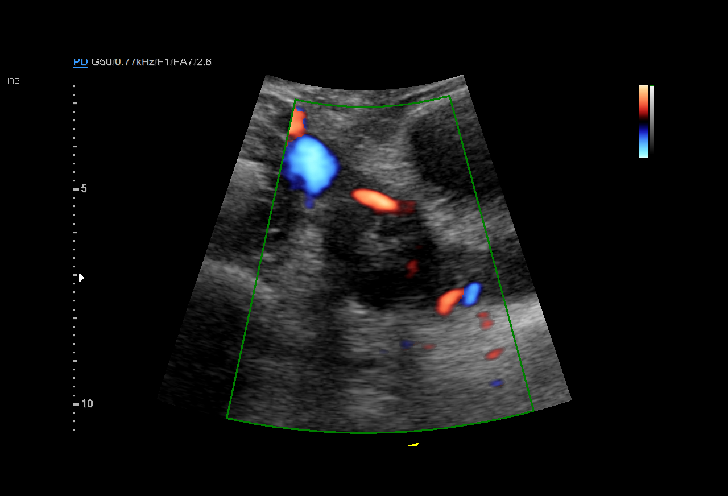
[im 13/36]
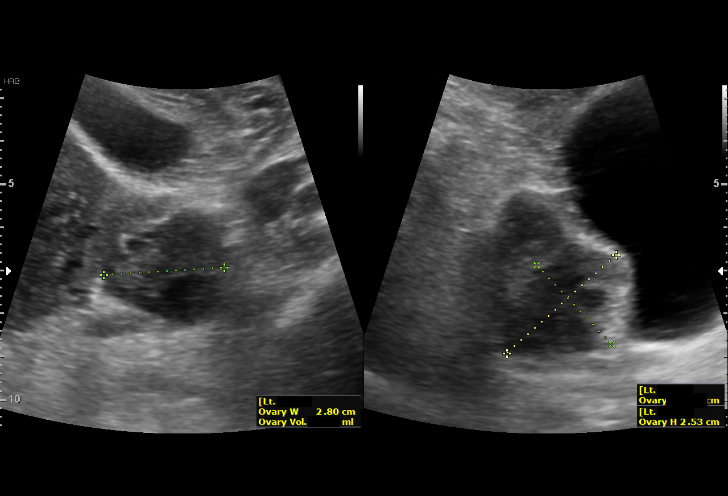
[im 16/36]
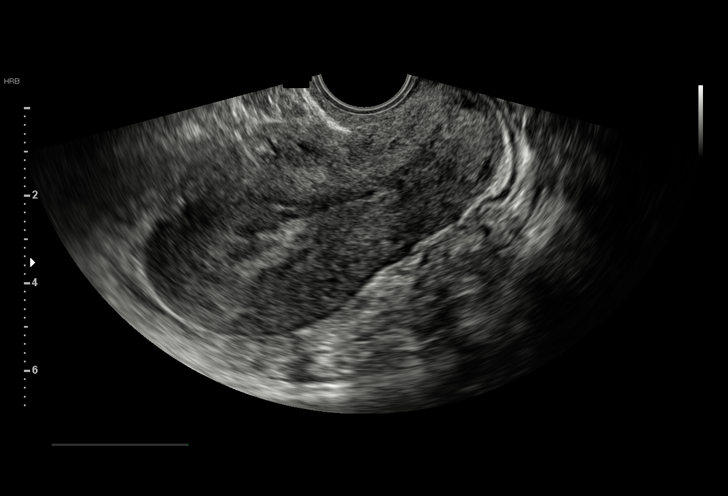
[im 19/36]
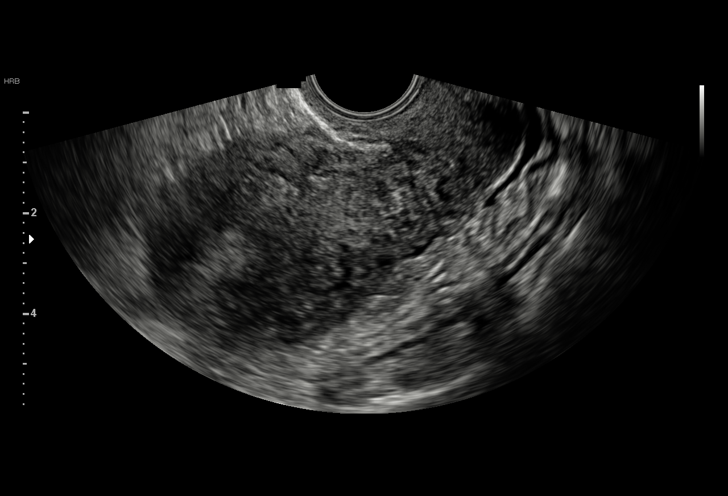
[im 20/36]
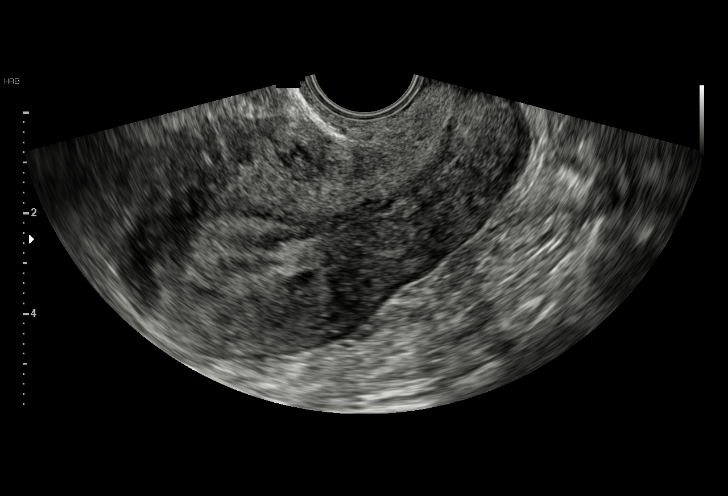
[im 23/36]
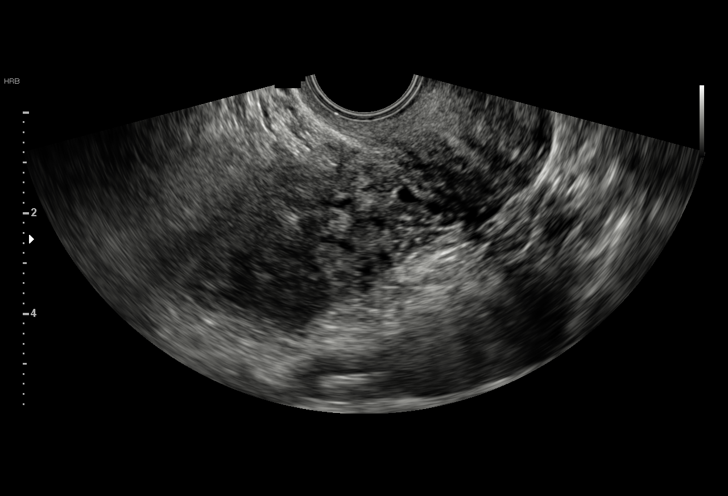
[im 25/36]
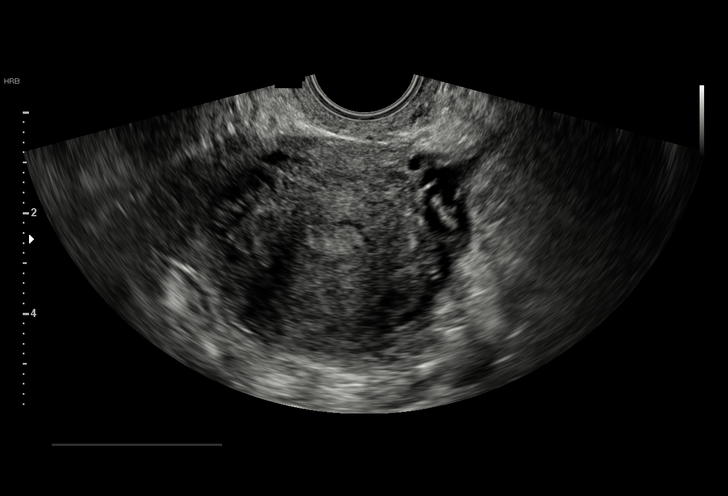
[im 28/36]
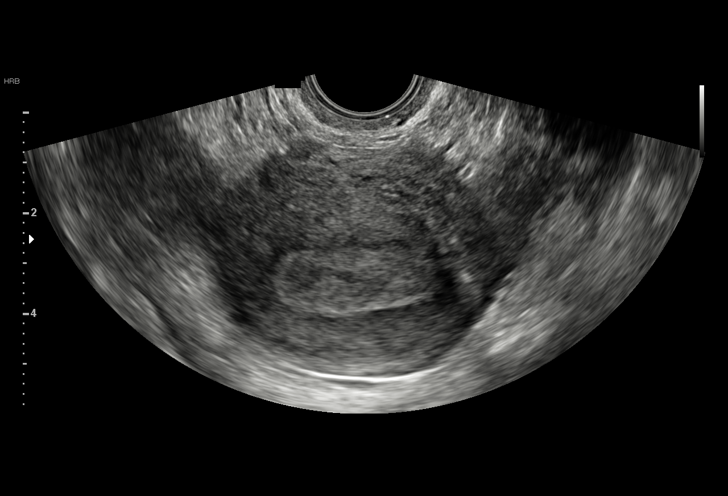
[im 30/36]
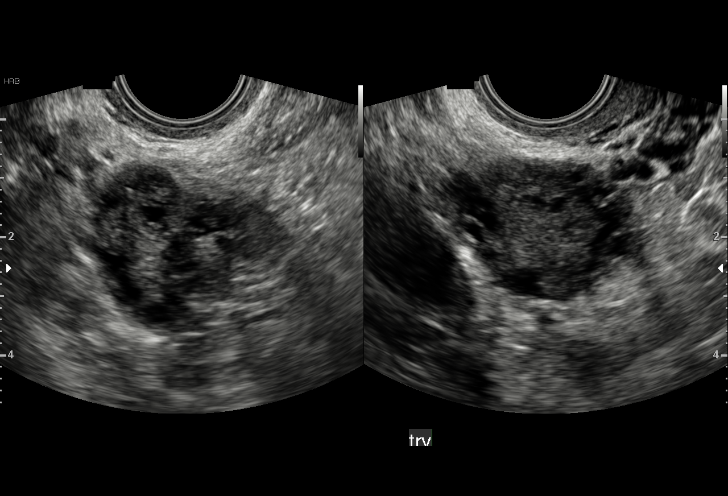
[im 33/36]
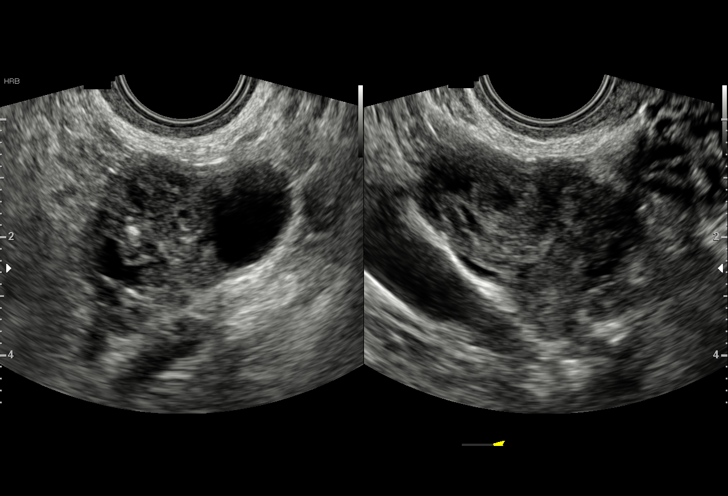
[im 36/36]
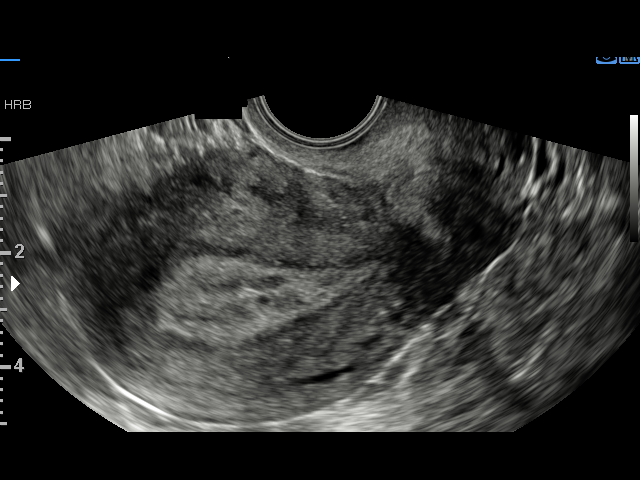

[15 of 28 positions shown; findings below may reference images not displayed]

FINDINGS: Intrauterine gestational sac: None

Yolk sac:  Not visualized

Embryo:  Not visualized

Cardiac Activity: Not visualized

Heart Rate:   bpm

MSD:   mm    w     d

CRL:    mm    w    d                  US EDC:

Subchorionic hemorrhage:  None visualized.

Maternal uterus/adnexae: No adnexal mass. Trace free fluid in the
pelvis.
IMPRESSION: No intrauterine pregnancy visualized. Differential considerations
would include early intrauterine pregnancy too early to visualize,
spontaneous abortion, or occult ectopic pregnancy. Recommend close
clinical followup and serial quantitative beta HCGs and ultrasounds.

## 2021-08-29 ENCOUNTER — Encounter (INDEPENDENT_AMBULATORY_CARE_PROVIDER_SITE_OTHER): Payer: Self-pay | Admitting: Primary Care

## 2021-09-01 ENCOUNTER — Other Ambulatory Visit (INDEPENDENT_AMBULATORY_CARE_PROVIDER_SITE_OTHER): Payer: Self-pay | Admitting: Primary Care

## 2021-09-01 DIAGNOSIS — F419 Anxiety disorder, unspecified: Secondary | ICD-10-CM

## 2021-09-01 NOTE — Telephone Encounter (Signed)
Requested medication (s) are due for refill today: unclear, historical provider, no details  Requested medication (s) are on the active medication list: yes  Last refill:  unkknown  Future visit scheduled: no, seen 04/04/21  Notes to clinic:  Historical Provider, please assess.       Requested Prescriptions  Pending Prescriptions Disp Refills   escitalopram (LEXAPRO) 20 MG tablet [Pharmacy Med Name: ESCITALOPRAM 20 MG TABLET] 90 tablet 1    Sig: TAKE 1 TABLET BY MOUTH EVERY DAY     Psychiatry:  Antidepressants - SSRI Passed - 09/01/2021  5:11 PM      Passed - Completed PHQ-2 or PHQ-9 in the last 360 days      Passed - Valid encounter within last 6 months    Recent Outpatient Visits           5 months ago Anxiety and depression   [facility] [name], NP   6 months ago COVID-19 virus infection   [facility] [name], NP   9 months ago Need for Tdap vaccination   [facility] [name], NP   1 year ago Encounter to establish care   [facility] [name], NP   1 year ago Encounter for completion of form with patient   [facility] at [facility], Kandee Keen, MD

## 2021-09-07 NOTE — Telephone Encounter (Signed)
Routed to PCP 

## 2021-09-10 ENCOUNTER — Other Ambulatory Visit (INDEPENDENT_AMBULATORY_CARE_PROVIDER_SITE_OTHER): Payer: Self-pay | Admitting: Primary Care

## 2021-09-11 ENCOUNTER — Encounter (INDEPENDENT_AMBULATORY_CARE_PROVIDER_SITE_OTHER): Payer: Self-pay | Admitting: Primary Care

## 2021-09-13 ENCOUNTER — Other Ambulatory Visit: Payer: Self-pay | Admitting: Obstetrics & Gynecology

## 2021-09-13 DIAGNOSIS — Z30011 Encounter for initial prescription of contraceptive pills: Secondary | ICD-10-CM

## 2021-09-18 MED ORDER — ESCITALOPRAM OXALATE 20 MG PO TABS: 20.0000 mg | ORAL_TABLET | Freq: Every day | ORAL | 1 refills | Status: DC

## 2021-09-20 ENCOUNTER — Ambulatory Visit (INDEPENDENT_AMBULATORY_CARE_PROVIDER_SITE_OTHER): Payer: BC Managed Care – PPO | Admitting: Primary Care

## 2021-09-20 VITALS — BP 120/76 | HR 77 | Temp 98.5°F | Wt 200.0 lb

## 2021-09-20 DIAGNOSIS — Z6834 Body mass index (BMI) 34.0-34.9, adult: Secondary | ICD-10-CM

## 2021-09-20 DIAGNOSIS — F32A Depression, unspecified: Secondary | ICD-10-CM

## 2021-09-20 DIAGNOSIS — E6609 Other obesity due to excess calories: Secondary | ICD-10-CM | POA: Diagnosis not present

## 2021-09-20 DIAGNOSIS — K644 Residual hemorrhoidal skin tags: Secondary | ICD-10-CM | POA: Diagnosis not present

## 2021-09-20 MED ORDER — HYDROCORTISONE (PERIANAL) 2.5 % EX CREA: 1.0000 | TOPICAL_CREAM | Freq: Two times a day (BID) | CUTANEOUS | 0 refills | Status: AC

## 2021-09-20 NOTE — Patient Instructions (Signed)
Calorie Counting for Weight Loss Calories are units of energy. Your body needs a certain number of calories from food to keep going throughout the day. When you eat or drink more calories than your body needs, your body stores the extra calories mostly as fat. When you eat or drink fewer calories than your body needs, your body burns fat to get the energy it needs. Calorie counting means keeping track of how many calories you eat and drink each day. Calorie counting can be helpful if you need to lose weight. If you eat fewer calories than your body needs, you should lose weight. Ask your health care provider what a healthy weight is for you. For calorie counting to work, you will need to eat the right number of calories each day to lose a healthy amount of weight per week. A dietitian can help you figure out how many calories you need in a day and will suggest ways to reach your calorie goal. A healthy amount of weight to lose each week is usually 1-2 lb (0.5-0.9 kg). This usually means that your daily calorie intake should be reduced by 500-750 calories. Eating 1,200-1,500 calories a day can help most women lose weight. Eating 1,500-1,800 calories a day can help most men lose weight. What do I need to know about calorie counting? Work with your health care provider or dietitian to determine how many calories you should get each day. To meet your daily calorie goal, you will need to: Find out how many calories are in each food that you would like to eat. Try to do this before you eat. Decide how much of the food you plan to eat. Keep a food log. Do this by writing down what you ate and how many calories it had. To successfully lose weight, it is important to balance calorie counting with a healthy lifestyle that includes regular activity. Where do I find calorie information?  The number of calories in a food can be found on a Nutrition Facts label. If a food does not have a Nutrition Facts label, try  to look up the calories online or ask your dietitian for help. Remember that calories are listed per serving. If you choose to have more than one serving of a food, you will have to multiply the calories per serving by the number of servings you plan to eat. For example, the label on a package of bread might say that a serving size is 1 slice and that there are 90 calories in a serving. If you eat 1 slice, you will have eaten 90 calories. If you eat 2 slices, you will have eaten 180 calories. How do I keep a food log? After each time that you eat, record the following in your food log as soon as possible: What you ate. Be sure to include toppings, sauces, and other extras on the food. How much you ate. This can be measured in cups, ounces, or number of items. How many calories were in each food and drink. The total number of calories in the food you ate. Keep your food log near you, such as in a pocket-sized notebook or on an app or website on your mobile phone. Some programs will calculate calories for you and show you how many calories you have left to meet your daily goal. What are some portion-control tips? Know how many calories are in a serving. This will help you know how many servings you can have of a certain   food. Use a measuring cup to measure serving sizes. You could also try weighing out portions on a kitchen scale. With time, you will be able to estimate serving sizes for some foods. Take time to put servings of different foods on your favorite plates or in your favorite bowls and cups so you know what a serving looks like. Try not to eat straight from a food's packaging, such as from a bag or box. Eating straight from the package makes it hard to see how much you are eating and can lead to overeating. Put the amount you would like to eat in a cup or on a plate to make sure you are eating the right portion. Use smaller plates, glasses, and bowls for smaller portions and to prevent  overeating. Try not to multitask. For example, avoid watching TV or using your computer while eating. If it is time to eat, sit down at a table and enjoy your food. This will help you recognize when you are full. It will also help you be more mindful of what and how much you are eating. What are tips for following this plan? Reading food labels Check the calorie count compared with the serving size. The serving size may be smaller than what you are used to eating. Check the source of the calories. Try to choose foods that are high in protein, fiber, and vitamins, and low in saturated fat, trans fat, and sodium. Shopping Read nutrition labels while you shop. This will help you make healthy decisions about which foods to buy. Pay attention to nutrition labels for low-fat or fat-free foods. These foods sometimes have the same number of calories or more calories than the full-fat versions. They also often have added sugar, starch, or salt to make up for flavor that was removed with the fat. Make a grocery list of lower-calorie foods and stick to it. Cooking Try to cook your favorite foods in a healthier way. For example, try baking instead of frying. Use low-fat dairy products. Meal planning Use more fruits and vegetables. One-half of your plate should be fruits and vegetables. Include lean proteins, such as chicken, turkey, and fish. Lifestyle Each week, aim to do one of the following: 150 minutes of moderate exercise, such as walking. 75 minutes of vigorous exercise, such as running. General information Know how many calories are in the foods you eat most often. This will help you calculate calorie counts faster. Find a way of tracking calories that works for you. Get creative. Try different apps or programs if writing down calories does not work for you. What foods should I eat?  Eat nutritious foods. It is better to have a nutritious, high-calorie food, such as an avocado, than a food with  few nutrients, such as a bag of potato chips. Use your calories on foods and drinks that will fill you up and will not leave you hungry soon after eating. Examples of foods that fill you up are nuts and nut butters, vegetables, lean proteins, and high-fiber foods such as whole grains. High-fiber foods are foods with more than 5 g of fiber per serving. Pay attention to calories in drinks. Low-calorie drinks include water and unsweetened drinks. The items listed above may not be a complete list of foods and beverages you can eat. Contact a dietitian for more information. What foods should I limit? Limit foods or drinks that are not good sources of vitamins, minerals, or protein or that are high in unhealthy fats. These   include: Candy. Other sweets. Sodas, specialty coffee drinks, alcohol, and juice. The items listed above may not be a complete list of foods and beverages you should avoid. Contact a dietitian for more information. How do I count calories when eating out? Pay attention to portions. Often, portions are much larger when eating out. Try these tips to keep portions smaller: Consider sharing a meal instead of getting your own. If you get your own meal, eat only half of it. Before you start eating, ask for a container and put half of your meal into it. When available, consider ordering smaller portions from the menu instead of full portions. Pay attention to your food and drink choices. Knowing the way food is cooked and what is included with the meal can help you eat fewer calories. If calories are listed on the menu, choose the lower-calorie options. Choose dishes that include vegetables, fruits, whole grains, low-fat dairy products, and lean proteins. Choose items that are boiled, broiled, grilled, or steamed. Avoid items that are buttered, battered, fried, or served with cream sauce. Items labeled as crispy are usually fried, unless stated otherwise. Choose water, low-fat milk,  unsweetened iced tea, or other drinks without added sugar. If you want an alcoholic beverage, choose a lower-calorie option, such as a glass of wine or light beer. Ask for dressings, sauces, and syrups on the side. These are usually high in calories, so you should limit the amount you eat. If you want a salad, choose a garden salad and ask for grilled meats. Avoid extra toppings such as bacon, cheese, or fried items. Ask for the dressing on the side, or ask for olive oil and vinegar or lemon to use as dressing. Estimate how many servings of a food you are given. Knowing serving sizes will help you be aware of how much food you are eating at restaurants. Where to find more information Centers for Disease Control and Prevention: www.cdc.gov U.[place]. Department of Agriculture: myplate.gov Summary Calorie counting means keeping track of how many calories you eat and drink each day. If you eat fewer calories than your body needs, you should lose weight. A healthy amount of weight to lose per week is usually 1-2 lb (0.5-0.9 kg). This usually means reducing your daily calorie intake by 500-750 calories. The number of calories in a food can be found on a Nutrition Facts label. If a food does not have a Nutrition Facts label, try to look up the calories online or ask your dietitian for help. Use smaller plates, glasses, and bowls for smaller portions and to prevent overeating. Use your calories on foods and drinks that will fill you up and not leave you hungry shortly after a meal. This information is not intended to replace advice given to you by your health care provider. Make sure you discuss any questions you have with your health care provider. Document Revised: 04/02/2019 Document Reviewed: 04/02/2019 Elsevier Patient Education  2023 Elsevier Inc.  

## 2021-09-25 NOTE — Progress Notes (Signed)
[facility]  Erin Ruiz, is a 31 y.o. female  [id number]  [id number]  DOB - Jul 19, 1990  Chief Complaint  Patient presents with   Follow-up    Medication management       Subjective:   Erin Ruiz is a 31 y.o. female here today for a follow up visit for depression previously requested refills on her Lexapro , never filled her Prozac. Today, she is pleasant made plans and goals trying to improve herself and children. She denies suicidal ideations,  harm to self or others , no hallucination.  Patient has No headache, No chest pain, No abdominal pain - No Nausea, No new weakness tingling or numbness, No Cough - shortness of breath  No problems updated.  No Known Allergies  Past Medical History:  Diagnosis Date   Depression    Encounter for supervision of other normal pregnancy in first trimester 12/02/2014   Heart murmur    childhood   Hypercholesterolemia    Preeclampsia 06/01/2015    Current Outpatient Medications on File Prior to Visit  Medication Sig Dispense Refill   benzoyl peroxide 5 % external liquid Apply topically 2 (two) times daily. 142 g 12   escitalopram (LEXAPRO) 20 MG tablet Take 1 tablet (20 mg total) by mouth daily. 90 tablet 1   Norethindrone Acetate-Ethinyl Estrad-FE (BLISOVI 24 FE) 1-20 MG-MCG(24) tablet Take 1 tablet by mouth daily. 28 tablet 6   meclizine (ANTIVERT) 25 MG tablet Take 1 tablet (25 mg total) by mouth 3 (three) times daily as needed for dizziness. (Patient not taking: Reported on 09/20/2021) 30 tablet 0   No current facility-administered medications on file prior to visit.    Objective:   Vitals:   09/20/21 0909  BP: 120/76  Pulse: 77  Temp: 98.5 F (36.9 C)  TempSrc: Oral  SpO2: 98%  Weight: 200 lb (90.7 kg)    Exam General appearance : Awake, alert, not in any distress. Speech Clear. Not toxic looking HEENT: Atraumatic and Normocephalic, pupils equally reactive to light and accomodation Neck:  Supple, no JVD. No cervical lymphadenopathy.  Chest: Good air entry bilaterally, no added sounds  CVS: S1 S2 regular, no murmurs.  Abdomen: Bowel sounds present, Non tender and not distended with no gaurding, rigidity or rebound. Extremities: B/L Lower Ext shows no edema, both legs are warm to touch Neurology: Awake alert, and oriented X 3, Non focal Skin: No Rash  Data Review Lab Results  Component Value Date   HGBA1C 5.2 08/30/2020   HGBA1C 5.30 03/03/2014    Assessment & Plan  Erin Ruiz was seen today for follow-up.  Diagnoses and all orders for this visit:  External hemorrhoid Internal increase pain with BM- advised increase fibers or OTC laxative if bleeding or enlarging will need to be referred to general surgery. -     hydrocortisone (ANUSOL-HC) 2.5 % rectal cream; Place 1 Application rectally 2 (two) times daily.  Depression, unspecified depression type Endorses feeling better on the Lexapro no changes needed in dosage. Explain if has problems to call and she will need to be weaned down and off medication can not be stopped abruptly. Patient verbalized understanding.  Class 2 obesity due to excess calories without serious comorbidity with body mass index (BMI) of 35.0 to 35.9 in adult Obesity is 30-39 indicating an excess in caloric intake or underlining conditions. This may lead to other co-morbidities. Lifestyle modifications of diet and exercise may reduce obesity.    Patient have been counseled extensively about nutrition  and exercise. Other issues discussed during this visit include: low cholesterol diet, weight control and daily exercise, foot care, annual eye examinations at Ophthalmology, importance of adherence with medications and regular follow-up. We also discussed long term complications of uncontrolled diabetes and hypertension.   Return in about 6 months (around 03/23/2022) for weight management / depression.  The patient was given clear instructions to go to ER  or return to medical center if symptoms don't improve, worsen or new problems develop. The patient verbalized understanding. The patient was told to call to get lab results if they haven't heard anything in the next week.   This note has been created with Education officer, environmental. Any transcriptional errors are unintentional.   [name], NP 09/25/2021, 9:55 AM

## 2021-10-06 ENCOUNTER — Telehealth: Payer: Self-pay

## 2021-10-06 DIAGNOSIS — Z30011 Encounter for initial prescription of contraceptive pills: Secondary | ICD-10-CM

## 2021-10-06 MED ORDER — BLISOVI 24 FE 1-20 MG-MCG(24) PO TABS: 1.0000 | ORAL_TABLET | Freq: Every day | ORAL | 0 refills | Status: DC

## 2021-10-06 MED ORDER — BLISOVI 24 FE 1-20 MG-MCG(24) PO TABS: 1.0000 | ORAL_TABLET | Freq: Every day | ORAL | 1 refills | Status: DC

## 2021-10-06 NOTE — Telephone Encounter (Signed)
S/w pt and sent in refill for [facility], transferred to scheduler for annual

## 2021-11-01 ENCOUNTER — Ambulatory Visit (INDEPENDENT_AMBULATORY_CARE_PROVIDER_SITE_OTHER): Payer: BC Managed Care – PPO | Admitting: Primary Care

## 2021-11-01 ENCOUNTER — Encounter (INDEPENDENT_AMBULATORY_CARE_PROVIDER_SITE_OTHER): Payer: Self-pay | Admitting: Primary Care

## 2021-11-01 VITALS — BP 121/85 | HR 90 | Temp 98.1°F | Ht 64.0 in | Wt 203.8 lb

## 2021-11-01 DIAGNOSIS — B359 Dermatophytosis, unspecified: Secondary | ICD-10-CM

## 2021-11-01 MED ORDER — CLOTRIMAZOLE 1 % EX CREA: 1.0000 | TOPICAL_CREAM | Freq: Two times a day (BID) | CUTANEOUS | 0 refills | Status: DC

## 2021-11-01 NOTE — Progress Notes (Signed)
  [facility]  Erin Ruiz, is a 31 y.o. female  [id number]:[id number]  [id number]  DOB - [birth year]  Chief Complaint  Patient presents with   ringworm    Outer right leg and below knee on left        Subjective:   Erin Ruiz is a 31 y.o. female here today for a possible ring worm. Her son plays foot ball and the children sit on her lap. Patient has No headache, No chest pain, No abdominal pain - No Nausea, No new weakness tingling or numbness, No Cough - shortness of breath  No problems updated.  No Known Allergies  Past Medical History:  Diagnosis Date   Depression    Encounter for supervision of other normal pregnancy in first trimester 12/02/2014   Heart murmur    childhood   Hypercholesterolemia    Preeclampsia 06/01/2015    Current Outpatient Medications on File Prior to Visit  Medication Sig Dispense Refill   benzoyl peroxide 5 % external liquid Apply topically 2 (two) times daily. 142 g 12   escitalopram (LEXAPRO) 20 MG tablet Take 1 tablet (20 mg total) by mouth daily. 90 tablet 1   hydrocortisone (ANUSOL-HC) 2.5 % rectal cream Place 1 Application rectally 2 (two) times daily. 30 g 0   Norethindrone Acetate-Ethinyl Estrad-FE (BLISOVI 24 FE) 1-20 MG-MCG(24) tablet Take 1 tablet by mouth daily. 28 tablet 1   No current facility-administered medications on file prior to visit.    Objective:   Vitals:   11/01/21 1404  BP: 121/85  Pulse: 90  Temp: 98.1 F (36.7 C)  TempSrc: Oral  SpO2: 96%  Weight: 203 lb 12.8 oz (92.4 kg)  Height: 5\' 4"  (1.626 m)    Exam General appearance : Awake, alert, not in any distress. Speech Clear. Not toxic looking HEENT: Atraumatic and Normocephalic, pupils equally reactive to light and accomodation Neck: Supple, no JVD. No cervical lymphadenopathy.  Chest: Good air entry bilaterally, no added sounds  CVS: S1 S2 regular, no murmurs.  Abdomen: Bowel sounds present, Non tender and not distended with no  gaurding, rigidity or rebound. Extremities: B/L Lower Ext shows no edema, both legs are warm to touch Neurology: Awake alert, and oriented X 3, CN II-XII intact, Non focal Skin: Rash  Data Review Lab Results  Component Value Date   HGBA1C 5.2 08/30/2020   HGBA1C 5.30 03/03/2014    Assessment & Plan  Erin Ruiz was seen today for ringworm.  Diagnoses and all orders for this visit:  Ringworm     -     clotrimazole (ANTIFUNGAL, CLOTRIMAZOLE,) 1 % cream; Apply 1 Application topically 2 (two) times daily.     Patient have been counseled extensively about nutrition and exercise. Other issues discussed during this visit include: low cholesterol diet, weight control and daily exercise, foot care, annual eye examinations at Ophthalmology, importance of adherence with medications and regular follow-up. We also discussed long term complications of uncontrolled diabetes and hypertension.   Return for keep follow up appt..  The patient was given clear instructions to go to ER or return to medical center if symptoms don't improve, worsen or new problems develop. The patient verbalized understanding. The patient was told to call to get lab results if they haven't heard anything in the next week.   This note has been created with Jonathon Bellows. Any transcriptional errors are unintentional.   Education officer, environmental, NP 11/03/2021, 12:39 AM

## 2021-11-01 NOTE — Patient Instructions (Signed)
Body Ringworm Body ringworm is an infection of the skin that often causes a ring-shaped rash. Body ringworm is also called tinea corporis. Body ringworm can affect any part of your skin. This condition is easily spread from person to person (is very contagious). What are the causes? This condition is caused by fungi called dermatophytes. The condition develops when these fungi grow out of control on the skin. You can get this condition if you touch a person or animal that has it. You can also get it if you share any items with an infected person or pet. These include: Clothing, bedding, and towels. Brushes or combs. Gym equipment. Any other object that has the fungus on it. What increases the risk? You are more likely to develop this condition if you: Play sports that involve close physical contact, such as wrestling. Sweat a lot. Live in areas that are hot and humid. Use public showers. Have a weakened immune system. What are the signs or symptoms? Symptoms of this condition include: Itchy, raised red spots and bumps. Red scaly patches. A ring-shaped rash. The rash may have: A clear center. Scales or red bumps at its center. Redness near its borders. Dry and scaly skin on or around it. How is this diagnosed? This condition can usually be diagnosed with a skin exam. A skin scraping may be taken from the affected area and examined under a microscope to see if the fungus is present. How is this treated? This condition may be treated with: An antifungal cream or ointment. An antifungal shampoo. Antifungal medicines. These may be prescribed if your ringworm: Is severe. Keeps coming back. Lasts a long time. Follow these instructions at home: Take over-the-counter and prescription medicines only as told by your health care provider. If you were given an antifungal cream or ointment: Use it as told by your health care provider. Wash the infected area and dry it completely before  applying the cream or ointment. If you were given an antifungal shampoo: Use it as told by your health care provider. Leave the shampoo on your body for 3-5 minutes before rinsing. While you have a rash: Wear loose clothing to stop clothes from rubbing and irritating it. Wash or change your bed sheets every night. Disinfect or throw out items that may be infected. Wash clothes and bed sheets in hot water. Wash your hands often with soap and water. If soap and water are not available, use hand sanitizer. If your pet has the same infection, take your pet to see a veterinarian for treatment. How is this prevented? Take a bath or shower every day and after every time you work out or play sports. Dry your skin completely after bathing. Wear sandals or shoes in public places and showers. Change your clothes every day. Wash athletic clothes after each use. Do not share personal items with others. Avoid touching red patches of skin on other people. Avoid touching pets that have bald spots. If you touch an animal that has a bald spot, wash your hands. Contact a health care provider if: Your rash continues to spread after 7 days of treatment. Your rash is not gone in 4 weeks. The area around your rash gets red, warm, tender, and swollen. Summary Body ringworm is an infection of the skin that often causes a ring-shaped rash. This condition is easily spread from person to person (is very contagious). This condition may be treated with antifungal cream or ointment, antifungal shampoo, or antifungal medicines. Take over-the-counter and   prescription medicines only as told by your health care provider. This information is not intended to replace advice given to you by your health care provider. Make sure you discuss any questions you have with your health care provider. Document Revised: 05/03/2021 Document Reviewed: 12/13/2020 Elsevier Patient Education  2023 Elsevier Inc.  

## 2021-11-26 ENCOUNTER — Other Ambulatory Visit (INDEPENDENT_AMBULATORY_CARE_PROVIDER_SITE_OTHER): Payer: Self-pay | Admitting: Primary Care

## 2021-11-26 DIAGNOSIS — B359 Dermatophytosis, unspecified: Secondary | ICD-10-CM

## 2021-11-27 NOTE — Telephone Encounter (Signed)
Requested medication (s) are due for refill today: yes  Requested medication (s) are on the active medication list:yes  Last refill:  11/01/21  Future visit scheduled: yes  Notes to clinic:  Unable to refill per protocol, Medication not assigned to a protocol, review manually.     Requested Prescriptions  Pending Prescriptions Disp Refills   clotrimazole (LOTRIMIN) 1 % cream [Pharmacy Med Name: CLOTRIMAZOLE 1% TOPICAL CREAM] 30 g 0    Sig: APPLY TO AFFECTED AREA TWICE A DAY     Off-Protocol Failed - 11/26/2021 10:45 AM      Failed - Medication not assigned to a protocol, review manually.      Passed - Valid encounter within last 12 months    Recent Outpatient Visits           3 weeks ago Ringworm   [place] Kerin Perna, NP   2 months ago External hemorrhoid   [place], Michelle P, NP   7 months ago Anxiety and depression   [place], Michelle P, NP   9 months ago COVID-19 virus infection   [place] Kerin Perna, NP   1 year ago Need for Tdap vaccination   [place] Kerin Perna, NP

## 2021-11-28 NOTE — Telephone Encounter (Signed)
Will forward to provider  

## 2021-12-05 ENCOUNTER — Encounter: Payer: Self-pay | Admitting: Obstetrics and Gynecology

## 2021-12-05 ENCOUNTER — Ambulatory Visit (INDEPENDENT_AMBULATORY_CARE_PROVIDER_SITE_OTHER): Payer: Medicaid Other | Admitting: Obstetrics and Gynecology

## 2021-12-05 ENCOUNTER — Other Ambulatory Visit (HOSPITAL_COMMUNITY)
Admission: RE | Admit: 2021-12-05 | Discharge: 2021-12-05 | Disposition: A | Discharge status: Home / Self Care | Payer: Medicaid Other | Source: Ambulatory Visit | Attending: Obstetrics & Gynecology | Admitting: Obstetrics & Gynecology

## 2021-12-05 VITALS — BP 122/81 | HR 87 | Ht 64.0 in | Wt 197.0 lb

## 2021-12-05 DIAGNOSIS — K649 Unspecified hemorrhoids: Secondary | ICD-10-CM

## 2021-12-05 DIAGNOSIS — Z01411 Encounter for gynecological examination (general) (routine) with abnormal findings: Secondary | ICD-10-CM | POA: Diagnosis not present

## 2021-12-05 DIAGNOSIS — Z124 Encounter for screening for malignant neoplasm of cervix: Secondary | ICD-10-CM | POA: Diagnosis not present

## 2021-12-05 DIAGNOSIS — Z3009 Encounter for other general counseling and advice on contraception: Secondary | ICD-10-CM | POA: Diagnosis not present

## 2021-12-05 DIAGNOSIS — R102 Pelvic and perineal pain: Secondary | ICD-10-CM | POA: Diagnosis not present

## 2021-12-05 DIAGNOSIS — Z113 Encounter for screening for infections with a predominantly sexual mode of transmission: Secondary | ICD-10-CM | POA: Diagnosis present

## 2021-12-05 DIAGNOSIS — Z01419 Encounter for gynecological examination (general) (routine) without abnormal findings: Secondary | ICD-10-CM | POA: Diagnosis not present

## 2021-12-05 DIAGNOSIS — Z30011 Encounter for initial prescription of contraceptive pills: Secondary | ICD-10-CM

## 2021-12-05 MED ORDER — BLISOVI 24 FE 1-20 MG-MCG(24) PO TABS: 1.0000 | ORAL_TABLET | Freq: Every day | ORAL | 12 refills | Status: DC

## 2021-12-05 NOTE — Progress Notes (Addendum)
31 y.o GYN presents for AEX/STD screening. C/o cramping 7-8/10 x 3+ months, inflamed hemorrhoids.  The Anusol cream does not help.  PHQ-9=13 Patient refused treatment  Last PAP 11/14/2020

## 2021-12-05 NOTE — Addendum Note (Signed)
Addended by: Vivien Rota on: 12/05/2021 04:28 PM   Modules accepted: Orders

## 2021-12-05 NOTE — Progress Notes (Addendum)
GYNECOLOGY ANNUAL PREVENTATIVE CARE ENCOUNTER NOTE  Subjective:   Erin Ruiz is a 31 y.o. [phone] female here for a annual gynecologic exam. Current complaints: uterine cramping. Has been going on for about a year. 4/7 days, she has cramping like she is about to start period, rates 7/10 days. Has to take medicine to improve pain. Last period was in September. Has monthly periods. Doesn't think her cramping is related to her periods. She is on OCPs, happy with them.   Has uncomfortable hemorrhoids. Had one about a year ago that improved with treatment but never really went away, has a new one that is very uncomfortable.    Denies abnormal vaginal bleeding, discharge, problems with intercourse or other gynecologic concerns. Requests STI screen.   Gynecologic History Patient's last menstrual period was 11/12/2021 (exact date). Contraception: OCP (estrogen/progesterone) Last Pap: 11/2020. Results: normal Last mammogram: n/a Gardisil: has received  Obstetric History OB History  Gravida Para Term Preterm AB Living  4 3 3   1 3   SAB IAB Ectopic Multiple Live Births  1     0 3    # Outcome Date GA Lbr Len/2nd Weight Sex Delivery Anes PTL Lv  4 SAB 12/28/18 [redacted]w[redacted]d    SAB     3 Term 06/01/15 [redacted]w[redacted]d 05:13 / 00:11 7 lb 12.5 oz (3.53 kg) M Vag-Spont EPI  LIV  2 Term 07/30/13 [redacted]w[redacted]d 08:00 / 00:24 7 lb 9 oz (3.43 kg) F Vag-Spont EPI  LIV  1 Term 12/30/09 [redacted]w[redacted]d  7 lb 1 oz (3.204 kg) F Vag-Spont None  LIV     Birth Comments: No complications    Past Medical History:  Diagnosis Date   Depression    Encounter for supervision of other normal pregnancy in first trimester 12/02/2014   Heart murmur    childhood   Hypercholesterolemia    Preeclampsia 06/01/2015    Past Surgical History:  Procedure Laterality Date   TONSILLECTOMY  2004   WISDOM TOOTH EXTRACTION      Current Outpatient Medications on File Prior to Visit  Medication Sig Dispense Refill   benzoyl peroxide 5 % external  liquid Apply topically 2 (two) times daily. 142 g 12   clotrimazole (LOTRIMIN) 1 % cream APPLY TO AFFECTED AREA TWICE A DAY (Patient not taking: Reported on 12/05/2021) 30 g 0   escitalopram (LEXAPRO) 20 MG tablet Take 1 tablet (20 mg total) by mouth daily. 90 tablet 1   hydrocortisone (ANUSOL-HC) 2.5 % rectal cream Place 1 Application rectally 2 (two) times daily. 30 g 0   No current facility-administered medications on file prior to visit.    No Known Allergies  Social History   Socioeconomic History   Marital status: Married    Spouse name: Not on file   Number of children: Not on file   Years of education: Not on file   Highest education level: Not on file  Occupational History   Not on file  Tobacco Use   Smoking status: Former   Smokeless tobacco: Never  Vaping Use   Vaping Use: Never used  Substance and Sexual Activity   Alcohol use: Yes    Comment: every other week   Drug use: No   Sexual activity: Yes    Partners: Male    Birth control/protection: Pill  Other Topics Concern   Not on file  Social History Narrative   Right handed   Lives with husband in one story home.   Social Determinants of  Health   Financial Resource Strain: Not on file  Food Insecurity: Not on file  Transportation Needs: Not on file  Physical Activity: Not on file  Stress: Not on file  Social Connections: Not on file  Intimate Partner Violence: Not on file    Family History  Problem Relation Age of Onset   Hypertension Mother    Asthma Father      The following portions of the patient's history were reviewed and updated as appropriate: allergies, current medications, past family history, past medical history, past social history, past surgical history and problem list.  Review of Systems Pertinent items are noted in HPI.   Objective:  BP 122/81   Pulse 87   Ht 5\' 4"  (1.626 m)   Wt 197 lb (89.4 kg)   LMP 11/12/2021 (Exact Date)   BMI 33.81 kg/m  CONSTITUTIONAL:  Well-developed, well-nourished female in no acute distress.  HENT:  Normocephalic, atraumatic, External right and left ear normal. Oropharynx is clear and moist EYES: Conjunctivae and EOM are normal. Pupils are equal, round, and reactive to light. No scleral icterus.  NECK: Normal range of motion, supple, no masses.  Normal thyroid.  SKIN: Skin is warm and dry. No rash noted. Not diaphoretic. No erythema. No pallor. NEUROLOGIC: Alert and oriented to person, place, and time. Normal reflexes, muscle tone coordination. No cranial nerve deficit noted. PSYCHIATRIC: Normal mood and affect. Normal behavior. Normal judgment and thought content. CARDIOVASCULAR: Normal heart rate noted RESPIRATORY: Effort normal, no problems with respiration noted. BREASTS: Symmetric in size. No masses, skin changes, nipple drainage, or lymphadenopathy. ABDOMEN: Soft, no distention noted.  No tenderness, rebound or guarding.  PELVIC: Normal appearing external genitalia; normal appearing vaginal mucosa and cervix.  No abnormal discharge noted.  Pelvic cultures obtained. Normal uterine size, no other palpable masses, no uterine or adnexal tenderness. MUSCULOSKELETAL: Normal range of motion. No tenderness.  No cyanosis, clubbing, or edema.   Exam done with chaperone present.  Assessment and Plan:   1. Encounter for oral contraception initial prescription - Norethindrone Acetate-Ethinyl Estrad-FE (BLISOVI 24 FE) 1-20 MG-MCG(24) tablet; Take 1 tablet by mouth daily.  Dispense: 28 tablet; Refill: 12  2. Encounter for counseling regarding contraception Renewed Rx Happy with OCPs  4. Routine screening for STI (sexually transmitted infection) - Cervicovaginal ancillary only( [place]) - Hepatitis C Antibody - HIV antibody (with reflex) - RPR - Hepatitis B surface antigen  5. Pelvic pain Regular cramping Keep diary to see if assoc with GI symptoms TVUS to rule out polyp/other etiology  6. Well woman exam Healthy  female exam Declines flu vaccine Declines COVID vaccine  7. Hemorrhoids, unspecified hemorrhoid type Increase fiber - Ambulatory referral to Gastroenterology  Will follow up results of STI screen and manage accordingly. Encouraged improvement in diet and exercise.  Requests STI screen. COVID vaccine  Mammogram n/a Referral for colonoscopy n/a Flu vaccine declines Gardisil n/a  Routine preventative health maintenance measures emphasized. Please refer to After Visit Summary for other counseling recommendations.     01/12/2022, MD, [facility] Attending Center for [facility])

## 2021-12-06 LAB — HIV ANTIBODY (ROUTINE TESTING W REFLEX): HIV Screen 4th Generation wRfx: NONREACTIVE

## 2021-12-06 LAB — HEPATITIS C ANTIBODY: Hep C Virus Ab: NONREACTIVE

## 2021-12-06 LAB — CERVICOVAGINAL ANCILLARY ONLY
Chlamydia: NEGATIVE
Comment: NEGATIVE
Comment: NEGATIVE
Comment: NORMAL
Neisseria Gonorrhea: NEGATIVE
Trichomonas: NEGATIVE

## 2021-12-06 LAB — HEPATITIS B SURFACE ANTIGEN: Hepatitis B Surface Ag: NEGATIVE

## 2021-12-06 LAB — RPR: RPR Ser Ql: NONREACTIVE

## 2021-12-08 ENCOUNTER — Encounter: Payer: Self-pay | Admitting: Gastroenterology

## 2021-12-08 ENCOUNTER — Telehealth: Payer: Self-pay | Admitting: Gastroenterology

## 2021-12-08 ENCOUNTER — Ambulatory Visit (INDEPENDENT_AMBULATORY_CARE_PROVIDER_SITE_OTHER): Payer: Medicaid Other | Admitting: Gastroenterology

## 2021-12-08 VITALS — BP 128/72 | HR 84 | Ht 64.0 in | Wt 199.0 lb

## 2021-12-08 DIAGNOSIS — K602 Anal fissure, unspecified: Secondary | ICD-10-CM | POA: Diagnosis not present

## 2021-12-08 DIAGNOSIS — K641 Second degree hemorrhoids: Secondary | ICD-10-CM | POA: Diagnosis not present

## 2021-12-08 MED ORDER — AMBULATORY NON FORMULARY MEDICATION: 0 refills | Status: AC

## 2021-12-08 MED ORDER — BENEFIBER PO POWD: ORAL | 0 refills | Status: AC

## 2021-12-08 NOTE — Patient Instructions (Addendum)
If you are age 30 or younger, your body mass index should be between 19-25. Your Body mass index is 34.16 kg/m. If this is out of the aformentioned range listed, please consider follow up with your Primary Care Provider.  ________________________________________________________  The  GI providers would like to encourage you to use [facility] to communicate with providers for non-urgent requests or questions.  Due to long hold times on the telephone, sending your provider a message by [facility] may be a faster and more efficient way to get a response.  Please allow 48 business hours for a response.  Please remember that this is for non-urgent requests.  _______________________________________________________  Please purchase the following medications over the counter and take as directed:  START: Benefiber one tablespoon two times daily  We have sent a prescription for Diltiazem 2% gel to [facility] for you. Using your index finger, you should apply a small amount of medication inside the rectum up to your first knuckle/joint three times daily x 6 to 8 weeks.  [facility]'s information is below: Address: [address], [place], [place] [address]  [phone]  *Please DO NOT go directly from our office to pick up this medication! Give the pharmacy 1 day to process the prescription as this is compounded and takes time to make.  You are scheduled to follow up on 02-19-22 at 1:30pm.  Thank you for entrusting me with your care and choosing [facility].  Dr Silverio Decamp

## 2021-12-08 NOTE — Progress Notes (Signed)
Erin Ruiz    [id number]    1990-06-16  Primary Care Physician:Edwards, Milford Cage, NP  Referring Physician: Kerin Perna, NP [address] [place],  [place] [address]   Chief complaint:  Hemorrhoids  HPI: 31 year old very pleasant female here here with complaints of symptomatic hemorrhoids.  She is experiencing anorectal itching and discomfort, worse in the past year.  Intermittent bright red blood per rectum and clear discharge. Denies any family history of colorectal cancer or inflammatory bowel disease in immediate family.  Maternal grandfather had colon cancer. Denies any trauma in the anorectal region   Outpatient Encounter Medications as of 12/08/2021  Medication Sig   benzoyl peroxide 5 % external liquid Apply topically 2 (two) times daily.   clotrimazole (LOTRIMIN) 1 % cream APPLY TO AFFECTED AREA TWICE A DAY (Patient not taking: Reported on 12/05/2021)   escitalopram (LEXAPRO) 20 MG tablet Take 1 tablet (20 mg total) by mouth daily.   hydrocortisone (ANUSOL-HC) 2.5 % rectal cream Place 1 Application rectally 2 (two) times daily.   Norethindrone Acetate-Ethinyl Estrad-FE (BLISOVI 24 FE) 1-20 MG-MCG(24) tablet Take 1 tablet by mouth daily.   No facility-administered encounter medications on file as of 12/08/2021.    Allergies as of 12/08/2021   (No Known Allergies)    Past Medical History:  Diagnosis Date   Depression    Encounter for supervision of other normal pregnancy in first trimester 12/02/2014   Heart murmur    childhood   Hypercholesterolemia    Preeclampsia 06/01/2015    Past Surgical History:  Procedure Laterality Date   TONSILLECTOMY  2004   WISDOM TOOTH EXTRACTION      Family History  Problem Relation Age of Onset   Hypertension Mother    Ovarian cancer Mother    Asthma Father    Colon cancer Maternal Grandfather     Social History   Socioeconomic History   Marital status: Married    Spouse name: Not on file    Number of children: Not on file   Years of education: Not on file   Highest education level: Not on file  Occupational History   Not on file  Tobacco Use   Smoking status: Former   Smokeless tobacco: Never  Vaping Use   Vaping Use: Never used  Substance and Sexual Activity   Alcohol use: Yes    Comment: every other week   Drug use: No   Sexual activity: Yes    Partners: Male    Birth control/protection: Pill  Other Topics Concern   Not on file  Social History Narrative   Right handed   Lives with husband in one story home.   Social Determinants of Health   Financial Resource Strain: Not on file  Food Insecurity: Not on file  Transportation Needs: Not on file  Physical Activity: Not on file  Stress: Not on file  Social Connections: Not on file  Intimate Partner Violence: Not on file      Review of systems: All other review of systems negative except as mentioned in the HPI.   Physical Exam: Vitals:   12/08/21 1350  BP: 128/72  Pulse: 84   Body mass index is 34.16 kg/m. Gen:      No acute distress HEENT:  sclera anicteric Abd:      soft, non-tender; no palpable masses, no distension Ext:    No edema Neuro: alert and oriented x 3 Psych: normal mood and  affect  Data Reviewed:  Reviewed labs, radiology imaging, old records and pertinent past GI work up   Assessment and Plan/Recommendations:  31 year old very pleasant female with anorectal discomfort, itching and discharge, on exam has anal fissure and small hemorrhoids Advised patient to use small pea-sized amount diltiazem 2% gel per rectum 3 times daily for 6 to 8 weeks to heal the anal fissure Use Benefiber 1 tablespoon 2-3 times daily with meals Avoid straining during defecation  If has persistent symptoms or nonhealing fissure, will consider colonoscopy for further evaluation Follow-up in 6 to 8 weeks   The patient was provided an opportunity to ask questions and all were answered. The patient  agreed with the plan and demonstrated an understanding of the instructions.  Damaris Hippo , MD    CC: Kerin Perna, NP

## 2021-12-12 ENCOUNTER — Other Ambulatory Visit: Payer: Medicaid Other

## 2021-12-25 ENCOUNTER — Other Ambulatory Visit: Payer: Medicaid Other

## 2021-12-28 ENCOUNTER — Encounter: Payer: Self-pay | Admitting: Gastroenterology

## 2022-01-02 ENCOUNTER — Ambulatory Visit: Payer: Medicaid Other | Admitting: Obstetrics

## 2022-01-12 ENCOUNTER — Ambulatory Visit: Payer: Medicaid Other

## 2022-01-26 ENCOUNTER — Telehealth: Payer: Medicaid Other | Admitting: Family Medicine

## 2022-01-26 DIAGNOSIS — K047 Periapical abscess without sinus: Secondary | ICD-10-CM | POA: Diagnosis not present

## 2022-01-26 NOTE — Progress Notes (Signed)
Virtual Visit Consent   Erin Ruiz, you are scheduled for a virtual visit with a [facility] provider today. Just as with appointments in the office, your consent must be obtained to participate. Your consent will be active for this visit and any virtual visit you may have with one of our providers in the next 365 days. If you have a MyChart account, a copy of this consent can be sent to you electronically.  As this is a virtual visit, video technology does not allow for your provider to perform a traditional examination. This may limit your provider's ability to fully assess your condition. If your provider identifies any concerns that need to be evaluated in person or the need to arrange testing (such as labs, EKG, etc.), we will make arrangements to do so. Although advances in technology are sophisticated, we cannot ensure that it will always work on either your end or our end. If the connection with a video visit is poor, the visit may have to be switched to a telephone visit. With either a video or telephone visit, we are not always able to ensure that we have a secure connection.  By engaging in this virtual visit, you consent to the provision of healthcare and authorize for your insurance to be billed (if applicable) for the services provided during this visit. Depending on your insurance coverage, you may receive a charge related to this service.  I need to obtain your verbal consent now. Are you willing to proceed with your visit today? Dominik Lauricella has provided verbal consent on 01/26/2022 for a virtual visit (video or telephone). Georgana Curio, FNP  Date: 01/26/2022 12:23 PM  Virtual Visit via Video Note   I, Georgana Curio, connected with  Erin Ruiz  ([id number], 30-Sep-1990) on 01/26/22 at 12:15 PM EST by a video-enabled telemedicine application and verified that I am speaking with the correct person using two identifiers.  Location: Patient: Virtual Visit Location Patient:  Home Provider: Virtual Visit Location Provider: Home Office   I discussed the limitations of evaluation and management by telemedicine and the availability of in person appointments. The patient expressed understanding and agreed to proceed.    History of Present Illness: Erin Ruiz is a 31 y.o. who identifies as a female who was assigned female at birth, and is being seen today for left lower dental pain with history of recurrent abscess. She has had dental work and due to cost has not had the last treatment. She as pain. No fever. [name]  HPI: HPI  Problems:  Patient Active Problem List   Diagnosis Date Noted   Anxiety and depression 03/20/2019   SAB (spontaneous abortion) 12/26/2018    Allergies: No Known Allergies Medications:  Current Outpatient Medications:    AMBULATORY NON FORMULARY MEDICATION, Using your index finger, apply a small amount of medication inside the rectum up to your first knuckle/joint three times daily x 6-8 weeks., Disp: 30 g, Rfl: 0   benzoyl peroxide 5 % external liquid, Apply topically 2 (two) times daily., Disp: 142 g, Rfl: 12   clotrimazole (LOTRIMIN) 1 % cream, APPLY TO AFFECTED AREA TWICE A DAY, Disp: 30 g, Rfl: 0   escitalopram (LEXAPRO) 20 MG tablet, Take 1 tablet (20 mg total) by mouth daily., Disp: 90 tablet, Rfl: 1   hydrocortisone (ANUSOL-HC) 2.5 % rectal cream, Place 1 Application rectally 2 (two) times daily., Disp: 30 g, Rfl: 0   Norethindrone Acetate-Ethinyl Estrad-FE (BLISOVI 24 FE) 1-20 MG-MCG(24) tablet, Take 1 tablet  by mouth daily., Disp: 28 tablet, Rfl: 12   Wheat Dextrin (BENEFIBER) POWD, 1 tablespoon two times per day, Disp: 30 g, Rfl: 0  Observations/Objective: Patient is well-developed, well-nourished in no acute distress.  Resting comfortably  at home.  Head is normocephalic, atraumatic.  No labored breathing.  Speech is clear and coherent with logical content.  Patient is alert and oriented at baseline.    Assessment and  Plan: 1. Dental abscess  Warm salt water rinses, heat, ibuprofen, follow up with dentist this week.   Follow Up Instructions: I discussed the assessment and treatment plan with the patient. The patient was provided an opportunity to ask questions and all were answered. The patient agreed with the plan and demonstrated an understanding of the instructions.  A copy of instructions were sent to the patient via MyChart unless otherwise noted below.     The patient was advised to call back or seek an in-person evaluation if the symptoms worsen or if the condition fails to improve as anticipated.  Time:  I spent 10 minutes with the patient via telehealth technology discussing the above problems/concerns.    Georgana Curio, FNP

## 2022-01-26 NOTE — Patient Instructions (Signed)
Dental Abscess  A dental abscess is an infection around a tooth that may involve pain, swelling, and a collection of pus, as well as other symptoms. Treatment is important to help with symptoms and to prevent the infection from spreading. The general types of dental abscesses are: Pulpal abscess. This abscess may form from the inner part of the tooth (pulp). Periodontal abscess. This abscess may form from the gum. What are the causes? This condition is caused by a bacterial infection in or around the tooth. It may result from: Severe tooth decay (cavities). Trauma to the tooth, such as a broken or chipped tooth. What increases the risk? This condition is more likely to develop in males. It is also more likely to develop in people who: Have cavities. Have severe gum disease. Eat sugary snacks between meals. Use tobacco products. Have diabetes. Have a weakened disease-fighting system (immune system). Do not brush and care for their teeth regularly. What are the signs or symptoms? Mild symptoms of this condition include: Tenderness. Bad breath. Fever. A bitter taste in the mouth. Pain in and around the infected tooth. Moderate symptoms of this condition include: Swollen neck glands. Chills. Pus drainage. Swelling and redness around the infected tooth, in the mouth, or in the face. Severe pain in and around the infected tooth. Severe symptoms of this condition include: Difficulty swallowing. Difficulty opening the mouth. Nausea. Vomiting. How is this diagnosed? This condition is diagnosed based on: Your symptoms and your medical and dental history. An examination of the infected tooth. During the exam, your dental care provider may tap on the infected tooth. You may also need to have X-rays taken of the affected area. How is this treated? This condition is treated by getting rid of the infection. This may be done with: Antibiotic medicines. These may be used in certain  situations. Antibacterial mouth rinse. Incision and drainage. This procedure is done by making an incision in the abscess to drain out the pus. Removing pus is the first priority in treating an abscess. A root canal. This may be performed to save the tooth. Your dental care provider accesses the visible part of your tooth (crown) with a drill and removes any infected pulp. Then the space is filled and sealed off. Tooth extraction. The tooth is pulled out if it cannot be saved by other treatment. You may also receive treatment for pain, such as: Acetaminophen or NSAIDs. Gels that contain a numbing medicine. An injection to block the pain near your nerve. Follow these instructions at home: Medicines Take over-the-counter and prescription medicines only as told by your dental care provider. If you were prescribed an antibiotic, take it as told by your dental care provider. Do not stop taking the antibiotic even if you start to feel better. If you were prescribed a gel that contains a numbing medicine, use it exactly as told in the directions. Do not use these gels for children who are younger than 2 years of age. Use an antibacterial mouth rinse as told by your dental care provider. General instructions  Gargle with a mixture of salt and water 3-4 times a day or as needed. To make salt water, completely dissolve -1 tsp (3-6 g) of salt in 1 cup (237 mL) of warm water. Eat a soft diet while your abscess is healing. Drink enough fluid to keep your urine pale yellow. Do not apply heat to the outside of your mouth. Do not use any products that contain nicotine or tobacco. These   products include cigarettes, chewing tobacco, and vaping devices, such as e-cigarettes. If you need help quitting, ask your dental care provider. Keep all follow-up visits. This is important. How is this prevented?  Excellent dental home care, which includes brushing your teeth every morning and night with fluoride  toothpaste. Floss one time each day. Get regularly scheduled dental cleanings. Consider having a dental sealant applied on teeth that have deep grooves to prevent cavities. Drink fluoridated water regularly. This includes most tap water. Check the label on bottled water to see if it contains fluoride. Reduce or eliminate sugary drinks. Eat healthy meals and snacks. Wear a mouth guard or face shield to protect your teeth while playing sports. Contact a health care provider if: Your pain is worse and is not helped by medicine. You have swelling. You see pus around the tooth. You have a fever or chills. Get help right away if: Your symptoms suddenly get worse. You have a very bad headache. You have problems breathing or swallowing. You have trouble opening your mouth. You have swelling in your neck or around your eye. These symptoms may represent a serious problem that is an emergency. Do not wait to see if the symptoms will go away. Get medical help right away. Call your local emergency services (911 in the [place] Do not drive yourself to the hospital. Summary A dental abscess is a collection of pus in or around a tooth that results from an infection. A dental abscess may result from severe tooth decay, trauma to the tooth, or severe gum disease around a tooth. Symptoms include severe pain, swelling, redness, and drainage of pus in and around the infected tooth. The first priority in treating a dental abscess is to drain out the pus. Treatment may also involve removing damage inside the tooth (root canal) or extracting the tooth. This information is not intended to replace advice given to you by your health care provider. Make sure you discuss any questions you have with your health care provider. Document Revised: 04/28/2020 Document Reviewed: 04/28/2020 Elsevier Patient Education  2023 [employer]  

## 2022-01-29 ENCOUNTER — Other Ambulatory Visit: Payer: Medicaid Other

## 2022-01-31 ENCOUNTER — Telehealth: Payer: Medicaid Other | Admitting: Family Medicine

## 2022-01-31 ENCOUNTER — Ambulatory Visit
Admission: RE | Admit: 2022-01-31 | Discharge: 2022-01-31 | Disposition: A | Discharge status: Home / Self Care | Payer: Medicaid Other | Source: Ambulatory Visit | Attending: Obstetrics and Gynecology | Admitting: Obstetrics and Gynecology

## 2022-01-31 DIAGNOSIS — R102 Pelvic and perineal pain: Secondary | ICD-10-CM | POA: Diagnosis present

## 2022-01-31 DIAGNOSIS — K047 Periapical abscess without sinus: Secondary | ICD-10-CM | POA: Diagnosis not present

## 2022-01-31 MED ORDER — AMOXICILLIN 500 MG PO CAPS: 500.0000 mg | ORAL_CAPSULE | Freq: Three times a day (TID) | ORAL | 0 refills | Status: AC

## 2022-01-31 NOTE — Progress Notes (Signed)
Virtual Visit Consent   Erin Ruiz, you are scheduled for a virtual visit with a [facility] provider today. Just as with appointments in the office, your consent must be obtained to participate. Your consent will be active for this visit and any virtual visit you may have with one of our providers in the next 365 days. If you have a MyChart account, a copy of this consent can be sent to you electronically.  As this is a virtual visit, video technology does not allow for your provider to perform a traditional examination. This may limit your provider's ability to fully assess your condition. If your provider identifies any concerns that need to be evaluated in person or the need to arrange testing (such as labs, EKG, etc.), we will make arrangements to do so. Although advances in technology are sophisticated, we cannot ensure that it will always work on either your end or our end. If the connection with a video visit is poor, the visit may have to be switched to a telephone visit. With either a video or telephone visit, we are not always able to ensure that we have a secure connection.  By engaging in this virtual visit, you consent to the provision of healthcare and authorize for your insurance to be billed (if applicable) for the services provided during this visit. Depending on your insurance coverage, you may receive a charge related to this service.  I need to obtain your verbal consent now. Are you willing to proceed with your visit today? [name] has provided verbal consent on 01/31/2022 for a virtual visit (video or telephone). Freddy Finner, NP  Date: 01/31/2022 9:51 AM  Virtual Visit via Video Note   I, Freddy Finner, connected with  Erin Ruiz  ([id number], 1990-06-23) on 01/31/22 at  9:45 AM EST by a video-enabled telemedicine application and verified that I am speaking with the correct person using two identifiers.  Location: Patient: Virtual Visit Location Patient:  Home Provider: Virtual Visit Location Provider: Home Office   I discussed the limitations of evaluation and management by telemedicine and the availability of in person appointments. The patient expressed understanding and agreed to proceed.    History of Present Illness: Erin Ruiz is a 31 y.o. who identifies as a female who was assigned female at birth, and is being seen today for left lower molar- reports root canal on that tooth- but reports pain with it. In between insurances and not able to get within the next 10 days. Has history of abscesses with that area prior. Mild facial swelling on left today. Denies fevers, chills, bleeding or drainage. Denies swallowing or chewing trouble, does chew on the other side.   Of note was seen on 01/26/2022- no Abx ordered at that time.    Problems:  Patient Active Problem List   Diagnosis Date Noted   Anxiety and depression 03/20/2019   SAB (spontaneous abortion) 12/26/2018    Allergies: No Known Allergies Medications:  Current Outpatient Medications:    AMBULATORY NON FORMULARY MEDICATION, Using your index finger, apply a small amount of medication inside the rectum up to your first knuckle/joint three times daily x 6-8 weeks., Disp: 30 g, Rfl: 0   benzoyl peroxide 5 % external liquid, Apply topically 2 (two) times daily., Disp: 142 g, Rfl: 12   clotrimazole (LOTRIMIN) 1 % cream, APPLY TO AFFECTED AREA TWICE A DAY, Disp: 30 g, Rfl: 0   escitalopram (LEXAPRO) 20 MG tablet, Take 1 tablet (20 mg  total) by mouth daily., Disp: 90 tablet, Rfl: 1   hydrocortisone (ANUSOL-HC) 2.5 % rectal cream, Place 1 Application rectally 2 (two) times daily., Disp: 30 g, Rfl: 0   Norethindrone Acetate-Ethinyl Estrad-FE (BLISOVI 24 FE) 1-20 MG-MCG(24) tablet, Take 1 tablet by mouth daily., Disp: 28 tablet, Rfl: 12   Wheat Dextrin (BENEFIBER) POWD, 1 tablespoon two times per day, Disp: 30 g, Rfl: 0  Observations/Objective: Patient is well-developed,  well-nourished in no acute distress.  Resting comfortably  at home.  Head is normocephalic, atraumatic.  No labored breathing.  Speech is clear and coherent with logical content.  Patient is alert and oriented at baseline.    Assessment and Plan:  1. Dental infection  - amoxicillin (AMOXIL) 500 MG capsule; Take 1 capsule (500 mg total) by mouth 3 (three) times daily for 10 days.  Dispense: 30 capsule; Refill: 0   -warm salt water rinses, heat, ibuprofen as needed, and call Dentist   Reviewed side effects, risks and benefits of medication.    Patient acknowledged agreement and understanding of the plan.   Past Medical, Surgical, Social History, Allergies, and Medications have been Reviewed.       Follow Up Instructions: I discussed the assessment and treatment plan with the patient. The patient was provided an opportunity to ask questions and all were answered. The patient agreed with the plan and demonstrated an understanding of the instructions.  A copy of instructions were sent to the patient via MyChart unless otherwise noted below.    The patient was advised to call back or seek an in-person evaluation if the symptoms worsen or if the condition fails to improve as anticipated.  Time:  I spent 10 minutes with the patient via telehealth technology discussing the above problems/concerns.    Freddy Finner, NP

## 2022-01-31 NOTE — Patient Instructions (Signed)
Erin Ruiz, thank you for joining Freddy Finner, NP for today's virtual visit.  While this provider is not your primary care provider (PCP), if your PCP is located in our provider database this encounter information will be shared with them immediately following your visit.   A [facility] MyChart account gives you access to today's visit and all your visits, tests, and labs performed at [facility] " click here if you don't have a [facility] MyChart account or go to [email or web address]  Consent: (Patient) Erin Ruiz provided verbal consent for this virtual visit at the beginning of the encounter.  Current Medications:  Current Outpatient Medications:    amoxicillin (AMOXIL) 500 MG capsule, Take 1 capsule (500 mg total) by mouth 3 (three) times daily for 10 days., Disp: 30 capsule, Rfl: 0   AMBULATORY NON FORMULARY MEDICATION, Using your index finger, apply a small amount of medication inside the rectum up to your first knuckle/joint three times daily x 6-8 weeks., Disp: 30 g, Rfl: 0   benzoyl peroxide 5 % external liquid, Apply topically 2 (two) times daily., Disp: 142 g, Rfl: 12   clotrimazole (LOTRIMIN) 1 % cream, APPLY TO AFFECTED AREA TWICE A DAY, Disp: 30 g, Rfl: 0   escitalopram (LEXAPRO) 20 MG tablet, Take 1 tablet (20 mg total) by mouth daily., Disp: 90 tablet, Rfl: 1   hydrocortisone (ANUSOL-HC) 2.5 % rectal cream, Place 1 Application rectally 2 (two) times daily., Disp: 30 g, Rfl: 0   Norethindrone Acetate-Ethinyl Estrad-FE (BLISOVI 24 FE) 1-20 MG-MCG(24) tablet, Take 1 tablet by mouth daily., Disp: 28 tablet, Rfl: 12   Wheat Dextrin (BENEFIBER) POWD, 1 tablespoon two times per day, Disp: 30 g, Rfl: 0   Medications ordered in this encounter:  Meds ordered this encounter  Medications   amoxicillin (AMOXIL) 500 MG capsule    Sig: Take 1 capsule (500 mg total) by mouth 3 (three) times daily for 10 days.    Dispense:  30 capsule    Refill:  0    Order  Specific Question:   Supervising Provider    Answer:   Merrilee Jansky [id number]     *If you need refills on other medications prior to your next appointment, please contact your pharmacy*  Follow-Up: Call back or seek an in-person evaluation if the symptoms worsen or if the condition fails to improve as anticipated.   Virtual Care [phone]  Other Instructions Dental Pain Dental pain is often a sign that something is wrong with your teeth or gums. You can also have pain after a dental treatment. If you have dental pain, it is important to contact your dentist, especially if the cause of the pain is not known. Dental pain may hurt a lot or a little and can be caused by many things, including: Tooth decay (cavities or caries). Infection. The inner part of the tooth being filled with pus (an abscess). Injury. A crack in the tooth. Gums that move back and expose the root of a tooth. Gum disease. Abnormal grinding or clenching of teeth. Not taking good care of your teeth. Sometimes the cause of pain is not known. You may have pain all the time, or it may happen only when you are: Chewing. Exposed to hot or cold temperatures. Eating or drinking foods or drinks that have a lot of sugar in them, such as soda or candy. Follow these instructions at home: Medicines Take over-the-counter and prescription medicines only as told by your dentist.  If you were prescribed an antibiotic medicine, take it as told by your dentist. Do not stop taking it even if you start to feel better. Eating and drinking Do not eat foods or drinks that cause you pain. These include: Very hot or very cold foods or drinks. Sweet or sugary foods or drinks. Managing pain and swelling  If told, put ice on the painful area of your face. To do this: Put ice in a plastic bag. Place a towel between your skin and the bag. Leave the ice on for 20 minutes, 2-3 times a day. Take off the ice if your skin  turns bright red. This is very important. If you cannot feel pain, heat, or cold, you have a greater risk of damage to the area. Brushing your teeth Brush your teeth twice a day using a fluoride toothpaste. Use a toothpaste made for sensitive teeth as told by your dentist. Use a soft toothbrush. General instructions Floss your teeth at least once a day. Do not put heat on the outside of your face. Rinse your mouth often with salt water. To make salt water, dissolve -1 tsp (3-6 g) of salt in 1 cup (237 mL) of warm water. Watch your dental pain. Let your dentist know if there are any changes. Keep all follow-up visits. Contact a dentist if: You have dental pain and you do not know why. Medicine does not help your pain. Your symptoms get worse. You have new symptoms. Get help right away if: You cannot open your mouth. You are having trouble breathing or swallowing. You have a fever. Your face, neck, or jaw is swollen. These symptoms may be an emergency. Get help right away. Call your local emergency services (911 in the [place] Do not wait to see if the symptoms will go away. Do not drive yourself to the hospital. Summary Dental pain may be caused by many things, including tooth decay, injury, or infection. In some cases, the cause is not known. Dental pain may hurt a lot or very little. You may have pain all the time, or you may have it only when you eat or drink. Take over-the-counter and prescription medicines only as told by your dentist. Watch your dental pain for any changes. Let your dentist know if symptoms get worse. This information is not intended to replace advice given to you by your health care provider. Make sure you discuss any questions you have with your health care provider. Document Revised: 11/25/2019 Document Reviewed: 11/25/2019 Elsevier Patient Education  2023 Elsevier Inc.    If you have been instructed to have an in-person evaluation today at a local Urgent  Care facility, please use the link below. It will take you to a list of all of our available [facility], including address, phone number and hours of operation. Please do not delay care.  [facility]  If you or a family member do not have a primary care provider, use the link below to schedule a visit and establish care. When you choose a [facility] primary care physician or advanced practice provider, you gain a long-term partner in health. Find a Primary Care Provider  Learn more about [facility]'s in-office and virtual care options: [facility] - Get Care Now

## 2022-02-16 ENCOUNTER — Telehealth: Payer: Medicaid Other | Admitting: Family Medicine

## 2022-02-16 ENCOUNTER — Ambulatory Visit: Payer: Self-pay

## 2022-02-16 DIAGNOSIS — K047 Periapical abscess without sinus: Secondary | ICD-10-CM

## 2022-02-16 MED ORDER — CLINDAMYCIN HCL 300 MG PO CAPS: 300.0000 mg | ORAL_CAPSULE | Freq: Three times a day (TID) | ORAL | 0 refills | Status: AC

## 2022-02-16 NOTE — Progress Notes (Signed)
Virtual Visit Consent   Erin Ruiz, you are scheduled for a virtual visit with a [facility] provider today. Just as with appointments in the office, your consent must be obtained to participate. Your consent will be active for this visit and any virtual visit you may have with one of our providers in the next 365 days. If you have a MyChart account, a copy of this consent can be sent to you electronically.  As this is a virtual visit, video technology does not allow for your provider to perform a traditional examination. This may limit your provider's ability to fully assess your condition. If your provider identifies any concerns that need to be evaluated in person or the need to arrange testing (such as labs, EKG, etc.), we will make arrangements to do so. Although advances in technology are sophisticated, we cannot ensure that it will always work on either your end or our end. If the connection with a video visit is poor, the visit may have to be switched to a telephone visit. With either a video or telephone visit, we are not always able to ensure that we have a secure connection.  By engaging in this virtual visit, you consent to the provision of healthcare and authorize for your insurance to be billed (if applicable) for the services provided during this visit. Depending on your insurance coverage, you may receive a charge related to this service.  I need to obtain your verbal consent now. Are you willing to proceed with your visit today? Erin Ruiz has provided verbal consent on 02/16/2022 for a virtual visit (video or telephone). Georgana Curio, FNP  Date: 02/16/2022 10:33 AM  Virtual Visit via Video Note   I, Georgana Curio, connected with  Erin Ruiz  ([id number], April 18, 1990) on 02/16/22 at 10:30 AM EST by a video-enabled telemedicine application and verified that I am speaking with the correct person using two identifiers.  Location: Patient: Virtual Visit Location Patient:  Home Provider: Virtual Visit Location Provider: Home Office   I discussed the limitations of evaluation and management by telemedicine and the availability of in person appointments. The patient expressed understanding and agreed to proceed.    History of Present Illness: Erin Ruiz is a 31 y.o. who identifies as a female who was assigned female at birth, and is being seen today for dental abscess left lower teeth. She notes pain, mild swelling and a pocket of pus she says. No fever. [name]  HPI: HPI  Problems:  Patient Active Problem List   Diagnosis Date Noted   Anxiety and depression 03/20/2019   SAB (spontaneous abortion) 12/26/2018    Allergies: No Known Allergies Medications:  Current Outpatient Medications:    AMBULATORY NON FORMULARY MEDICATION, Using your index finger, apply a small amount of medication inside the rectum up to your first knuckle/joint three times daily x 6-8 weeks., Disp: 30 g, Rfl: 0   benzoyl peroxide 5 % external liquid, Apply topically 2 (two) times daily., Disp: 142 g, Rfl: 12   clindamycin (CLEOCIN) 300 MG capsule, Take 1 capsule (300 mg total) by mouth 3 (three) times daily for 7 days., Disp: 21 capsule, Rfl: 0   clotrimazole (LOTRIMIN) 1 % cream, APPLY TO AFFECTED AREA TWICE A DAY, Disp: 30 g, Rfl: 0   escitalopram (LEXAPRO) 20 MG tablet, Take 1 tablet (20 mg total) by mouth daily., Disp: 90 tablet, Rfl: 1   hydrocortisone (ANUSOL-HC) 2.5 % rectal cream, Place 1 Application rectally 2 (two) times daily., Disp: 30  g, Rfl: 0   Norethindrone Acetate-Ethinyl Estrad-FE (BLISOVI 24 FE) 1-20 MG-MCG(24) tablet, Take 1 tablet by mouth daily., Disp: 28 tablet, Rfl: 12   Wheat Dextrin (BENEFIBER) POWD, 1 tablespoon two times per day, Disp: 30 g, Rfl: 0  Observations/Objective: Patient is well-developed, well-nourished in no acute distress.  Resting comfortably   at home.  Head is normocephalic, atraumatic.  No labored breathing.  Speech is clear and coherent  with logical content.  Patient is alert and oriented at baseline.    Assessment and Plan: 1. Dental abscess  Warm salt water rinses, keep apptmt 12/20 for extraction, er or urgent care if sx worsen.   Follow Up Instructions: I discussed the assessment and treatment plan with the patient. The patient was provided an opportunity to ask questions and all were answered. The patient agreed with the plan and demonstrated an understanding of the instructions.  A copy of instructions were sent to the patient via MyChart unless otherwise noted below.     The patient was advised to call back or seek an in-person evaluation if the symptoms worsen or if the condition fails to improve as anticipated.  Time:  I spent 10 minutes with the patient via telehealth technology discussing the above problems/concerns.    Dellia Nims, FNP

## 2022-02-16 NOTE — Patient Instructions (Signed)
Dental Abscess  A dental abscess is an infection around a tooth that may involve pain, swelling, and a collection of pus, as well as other symptoms. Treatment is important to help with symptoms and to prevent the infection from spreading. The general types of dental abscesses are: Pulpal abscess. This abscess may form from the inner part of the tooth (pulp). Periodontal abscess. This abscess may form from the gum. What are the causes? This condition is caused by a bacterial infection in or around the tooth. It may result from: Severe tooth decay (cavities). Trauma to the tooth, such as a broken or chipped tooth. What increases the risk? This condition is more likely to develop in males. It is also more likely to develop in people who: Have cavities. Have severe gum disease. Eat sugary snacks between meals. Use tobacco products. Have diabetes. Have a weakened disease-fighting system (immune system). Do not brush and care for their teeth regularly. What are the signs or symptoms? Mild symptoms of this condition include: Tenderness. Bad breath. Fever. A bitter taste in the mouth. Pain in and around the infected tooth. Moderate symptoms of this condition include: Swollen neck glands. Chills. Pus drainage. Swelling and redness around the infected tooth, in the mouth, or in the face. Severe pain in and around the infected tooth. Severe symptoms of this condition include: Difficulty swallowing. Difficulty opening the mouth. Nausea. Vomiting. How is this diagnosed? This condition is diagnosed based on: Your symptoms and your medical and dental history. An examination of the infected tooth. During the exam, your dental care provider may tap on the infected tooth. You may also need to have X-rays taken of the affected area. How is this treated? This condition is treated by getting rid of the infection. This may be done with: Antibiotic medicines. These may be used in certain  situations. Antibacterial mouth rinse. Incision and drainage. This procedure is done by making an incision in the abscess to drain out the pus. Removing pus is the first priority in treating an abscess. A root canal. This may be performed to save the tooth. Your dental care provider accesses the visible part of your tooth (crown) with a drill and removes any infected pulp. Then the space is filled and sealed off. Tooth extraction. The tooth is pulled out if it cannot be saved by other treatment. You may also receive treatment for pain, such as: Acetaminophen or NSAIDs. Gels that contain a numbing medicine. An injection to block the pain near your nerve. Follow these instructions at home: Medicines Take over-the-counter and prescription medicines only as told by your dental care provider. If you were prescribed an antibiotic, take it as told by your dental care provider. Do not stop taking the antibiotic even if you start to feel better. If you were prescribed a gel that contains a numbing medicine, use it exactly as told in the directions. Do not use these gels for children who are younger than 2 years of age. Use an antibacterial mouth rinse as told by your dental care provider. General instructions  Gargle with a mixture of salt and water 3-4 times a day or as needed. To make salt water, completely dissolve -1 tsp (3-6 g) of salt in 1 cup (237 mL) of warm water. Eat a soft diet while your abscess is healing. Drink enough fluid to keep your urine pale yellow. Do not apply heat to the outside of your mouth. Do not use any products that contain nicotine or tobacco. These   products include cigarettes, chewing tobacco, and vaping devices, such as e-cigarettes. If you need help quitting, ask your dental care provider. Keep all follow-up visits. This is important. How is this prevented?  Excellent dental home care, which includes brushing your teeth every morning and night with fluoride  toothpaste. Floss one time each day. Get regularly scheduled dental cleanings. Consider having a dental sealant applied on teeth that have deep grooves to prevent cavities. Drink fluoridated water regularly. This includes most tap water. Check the label on bottled water to see if it contains fluoride. Reduce or eliminate sugary drinks. Eat healthy meals and snacks. Wear a mouth guard or face shield to protect your teeth while playing sports. Contact a health care provider if: Your pain is worse and is not helped by medicine. You have swelling. You see pus around the tooth. You have a fever or chills. Get help right away if: Your symptoms suddenly get worse. You have a very bad headache. You have problems breathing or swallowing. You have trouble opening your mouth. You have swelling in your neck or around your eye. These symptoms may represent a serious problem that is an emergency. Do not wait to see if the symptoms will go away. Get medical help right away. Call your local emergency services (911 in the [place] Do not drive yourself to the hospital. Summary A dental abscess is a collection of pus in or around a tooth that results from an infection. A dental abscess may result from severe tooth decay, trauma to the tooth, or severe gum disease around a tooth. Symptoms include severe pain, swelling, redness, and drainage of pus in and around the infected tooth. The first priority in treating a dental abscess is to drain out the pus. Treatment may also involve removing damage inside the tooth (root canal) or extracting the tooth. This information is not intended to replace advice given to you by your health care provider. Make sure you discuss any questions you have with your health care provider. Document Revised: 04/28/2020 Document Reviewed: 04/28/2020 Elsevier Patient Education  2023 [employer]  

## 2022-02-16 NOTE — Telephone Encounter (Signed)
    Chief Complaint: Continued dental pain, swelling left side Symptoms: Left lower tooth,  Frequency: 01/31/22 Pertinent Negatives: Patient denies fever Disposition: [] ED /[] Urgent Care (no appt availability in office) / [] Appointment(In office/virtual)/ [x]  [facility]/ [] Home Care/ [] Refused Recommended Disposition /[] [facility]/ []  Follow-up with PCP Additional Notes: Has appointment with dentist 02/21/22.  Answer Assessment - Initial Assessment Questions 1. LOCATION: "Which tooth is hurting?"  (e.g., right-side/left-side, upper/lower, front/back)     Left lower tooth 2. ONSET: "When did the toothache start?"  (e.g., hours, days)      01/31/22 3. SEVERITY: "How bad is the toothache?"  (Scale 1-10; mild, moderate or severe)   - MILD (1-3): doesn't interfere with chewing    - MODERATE (4-7): interferes with chewing, interferes with normal activities, awakens from sleep     - SEVERE (8-10): unable to eat, unable to do any normal activities, excruciating pain        4 4. SWELLING: "Is there any visible swelling of your face?"     Nose and jaw line 5. OTHER SYMPTOMS: "Do you have any other symptoms?" (e.g., fever)     Swelling to right arm and hand 6. PREGNANCY: "Is there any chance you are pregnant?" "When was your last menstrual period?"     No  Protocols used: Toothache-A-AH

## 2022-02-19 ENCOUNTER — Ambulatory Visit: Payer: Medicaid Other | Admitting: Gastroenterology

## 2022-02-21 ENCOUNTER — Ambulatory Visit (INDEPENDENT_AMBULATORY_CARE_PROVIDER_SITE_OTHER): Payer: Medicaid Other | Admitting: Primary Care

## 2022-03-02 ENCOUNTER — Ambulatory Visit (INDEPENDENT_AMBULATORY_CARE_PROVIDER_SITE_OTHER): Payer: Medicaid Other | Admitting: Primary Care

## 2022-03-02 ENCOUNTER — Encounter (INDEPENDENT_AMBULATORY_CARE_PROVIDER_SITE_OTHER): Payer: Self-pay | Admitting: Primary Care

## 2022-03-02 VITALS — BP 138/85 | HR 93 | Ht 64.0 in | Wt 210.8 lb

## 2022-03-02 DIAGNOSIS — Z23 Encounter for immunization: Secondary | ICD-10-CM

## 2022-03-02 DIAGNOSIS — R41840 Attention and concentration deficit: Secondary | ICD-10-CM | POA: Diagnosis not present

## 2022-03-02 NOTE — Progress Notes (Signed)
Attention span issues

## 2022-03-02 NOTE — Progress Notes (Signed)
[facility]  Erin Ruiz, is a 31 y.o. female  JJK:[id number]  BZJ:[id number]  DOB - Mar 09, 1990  Chief Complaint  Patient presents with   ADHD    Getting screened and runs in family  Having symptoms.        Subjective:   [name] is a 31 y.o. female here today concerns with possibly having ADD.  She finds herself starting a task and then remembers she needs to do something else, then she goes to cleaning the kitchen never went back to the first task family members she needs to wash close after a 6 to 8 hours or day of trying to clean the house and given the children little tested the house is still not.  Patient has No headache, No chest pain, No abdominal pain - No Nausea, No new weakness tingling or numbness, No Cough - shortness of breath  No problems updated.  No Known Allergies  Past Medical History:  Diagnosis Date   Anal fissure 2017   Depression    Encounter for supervision of other normal pregnancy in first trimester 12/02/2014   Heart murmur    childhood   Hypercholesterolemia    Preeclampsia 06/01/2015    Current Outpatient Medications on File Prior to Visit  Medication Sig Dispense Refill   escitalopram (LEXAPRO) 20 MG tablet Take 1 tablet (20 mg total) by mouth daily. 90 tablet 1   Norethindrone Acetate-Ethinyl Estrad-FE (BLISOVI 24 FE) 1-20 MG-MCG(24) tablet Take 1 tablet by mouth daily. 28 tablet 12   AMBULATORY NON FORMULARY MEDICATION Using your index finger, apply a small amount of medication inside the rectum up to your first knuckle/joint three times daily x 6-8 weeks. 30 g 0   benzoyl peroxide 5 % external liquid Apply topically 2 (two) times daily. 142 g 12   clotrimazole (LOTRIMIN) 1 % cream APPLY TO AFFECTED AREA TWICE A DAY 30 g 0   hydrocortisone (ANUSOL-HC) 2.5 % rectal cream Place 1 Application rectally 2 (two) times daily. 30 g 0   Wheat Dextrin (BENEFIBER) POWD 1 tablespoon two times per day 30 g 0   No current  facility-administered medications on file prior to visit.    Objective:  There were no vitals filed for this visit.  Exam General appearance : Awake, alert, not in any distress. Speech Clear. Not toxic looking HEENT: Atraumatic and Normocephalic, pupils equally reactive to light and accomodation Neck: Supple, no JVD. No cervical lymphadenopathy.  Chest: Good air entry bilaterally, no added sounds  CVS: S1 S2 regular, no murmurs.  Abdomen: Bowel sounds present, Non tender and not distended with no gaurding, rigidity or rebound. Extremities: B/L Lower Ext shows no edema, both legs are warm to touch Neurology: Awake alert, and oriented X 3, CN II-XII intact, Non focal Skin: No Rash  Data Review Lab Results  Component Value Date   HGBA1C 5.2 08/30/2020   HGBA1C 5.30 03/03/2014    Assessment & Plan  [name] was seen today for adhd.  Diagnoses and all orders for this visit:  Concentration deficit R/0- ADD or other factor for concentration deficits.  Ambulatory referral to Psychiatry   Patient have been counseled extensively about nutrition and exercise. Other issues discussed during this visit include: low cholesterol diet, weight control and daily exercise, foot care, annual eye examinations at Ophthalmology, importance of adherence with medications and regular follow-up. We also discussed long term complications of uncontrolled diabetes and hypertension.   The patient was given clear instructions to go  to ER or return to medical center if symptoms don't improve, worsen or new problems develop. The patient verbalized understanding. The patient was told to call to get lab results if they haven't heard anything in the next week.   This note has been created with Education officer, environmental. Any transcriptional errors are unintentional.   [name], NP 03/02/2022, 8:42 AM

## 2022-03-07 ENCOUNTER — Encounter (INDEPENDENT_AMBULATORY_CARE_PROVIDER_SITE_OTHER): Payer: Self-pay | Admitting: Primary Care

## 2022-03-08 ENCOUNTER — Telehealth: Payer: Self-pay | Admitting: Primary Care

## 2022-03-08 NOTE — Telephone Encounter (Signed)
Copied from [facility] [phone]. Topic: Referral - Request for Referral >> Mar 08, 2022  2:31 PM Everette C wrote: Has patient seen PCP for this complaint? Yes.   *If NO, is insurance requiring patient see PCP for this issue before PCP can refer them? Referral for which specialty: Behavioral health  Preferred provider/office: Patient has no preference but would like for them to accept their insurance  Reason for referral: The patient would like to be tested for ADHD

## 2022-03-15 ENCOUNTER — Ambulatory Visit (INDEPENDENT_AMBULATORY_CARE_PROVIDER_SITE_OTHER): Payer: Medicaid Other | Admitting: Primary Care

## 2022-03-20 ENCOUNTER — Encounter (INDEPENDENT_AMBULATORY_CARE_PROVIDER_SITE_OTHER): Payer: Self-pay | Admitting: Primary Care

## 2022-03-20 ENCOUNTER — Ambulatory Visit (INDEPENDENT_AMBULATORY_CARE_PROVIDER_SITE_OTHER): Payer: Medicaid Other | Admitting: Primary Care

## 2022-03-20 VITALS — BP 130/82 | HR 87 | Resp 16 | Ht 64.0 in | Wt 212.4 lb

## 2022-03-20 DIAGNOSIS — Z79899 Other long term (current) drug therapy: Secondary | ICD-10-CM | POA: Diagnosis not present

## 2022-03-20 DIAGNOSIS — F419 Anxiety disorder, unspecified: Secondary | ICD-10-CM

## 2022-03-20 DIAGNOSIS — E6609 Other obesity due to excess calories: Secondary | ICD-10-CM | POA: Diagnosis not present

## 2022-03-20 DIAGNOSIS — Z6835 Body mass index (BMI) 35.0-35.9, adult: Secondary | ICD-10-CM

## 2022-03-20 DIAGNOSIS — I10 Essential (primary) hypertension: Secondary | ICD-10-CM | POA: Diagnosis not present

## 2022-03-20 DIAGNOSIS — F32A Depression, unspecified: Secondary | ICD-10-CM

## 2022-03-20 DIAGNOSIS — Z760 Encounter for issue of repeat prescription: Secondary | ICD-10-CM

## 2022-03-22 NOTE — Progress Notes (Signed)
Erin Ruiz, is a 32 y.o. female  TKZ:[id number]  TDD:[id number]  DOB - 1991-02-06  Chief Complaint  Patient presents with   Blood Pressure Check   Weight Management Screening       Subjective:   Erin Ruiz is a 32 y.o. female here today for a follow up visit for Bp . Patient has No headache, No chest pain, No abdominal pain - No Nausea, No new weakness tingling or numbness, No Cough - shortness of breath. Discuss medication for depression unsure if actually working or not. Also, concerned about weight gain and how it makes her feel about herself.  No problems updated.  No Known Allergies  Past Medical History:  Diagnosis Date   Anal fissure 2017   Depression    Encounter for supervision of other normal pregnancy in first trimester 12/02/2014   Heart murmur    childhood   Hypercholesterolemia    Preeclampsia 06/01/2015    Current Outpatient Medications on File Prior to Visit  Medication Sig Dispense Refill   AMBULATORY NON FORMULARY MEDICATION Using your index finger, apply a small amount of medication inside the rectum up to your first knuckle/joint three times daily x 6-8 weeks. 30 g 0   benzoyl peroxide 5 % external liquid Apply topically 2 (two) times daily. 142 g 12   clotrimazole (LOTRIMIN) 1 % cream APPLY TO AFFECTED AREA TWICE A DAY 30 g 0   hydrocortisone (ANUSOL-HC) 2.5 % rectal cream Place 1 Application rectally 2 (two) times daily. 30 g 0   Norethindrone Acetate-Ethinyl Estrad-FE (BLISOVI 24 FE) 1-20 MG-MCG(24) tablet Take 1 tablet by mouth daily. 28 tablet 12   Wheat Dextrin (BENEFIBER) POWD 1 tablespoon two times per day 30 g 0   No current facility-administered medications on file prior to visit.    Objective:   Vitals:   03/20/22 1501 03/20/22 1525  BP: (Abnormal) 140/81 130/82  Pulse: 87   Resp: 16   SpO2: 96%   Weight: 212 lb 6.4 oz (96.3 kg)   Height: 5\' 4"  (1.626 m)     Exam General appearance :  Awake, alert, not in any distress. Speech Clear. Not toxic looking HEENT: Atraumatic and Normocephalic, pupils equally reactive to light and accomodation Neck: Supple, no JVD. No cervical lymphadenopathy.  Chest: Good air entry bilaterally, no added sounds  CVS: S1 S2 regular, no murmurs.  Abdomen: Bowel sounds present, Non tender and not distended with no gaurding, rigidity or rebound. Extremities: B/L Lower Ext shows no edema, both legs are warm to touch Neurology: Awake alert, and oriented X 3, CN II-XII intact, Non focal Skin: No Rash  Data Review Lab Results  Component Value Date   HGBA1C 5.2 08/30/2020   HGBA1C 5.30 03/03/2014    Assessment & Plan   1. Anxiety and depression Added  adjunct therapy Vravylar 1.5 mg daily. Continue lexapro 20mg  daily   2. Class 2 obesity due to excess calories without serious comorbidity with body mass index (BMI) of 35.0 to 35.9 in adult Obesity is 30-39 indicating an excess in caloric intake or underlining conditions. This may lead to other co-morbidities. Educated on lifestyle modifications of diet and exercise which may reduce obesity.    3. Medication management 2/2 Medication refill  Vravylar 1.5 mg daily  Lexapro 20mg  daily   4. Essential hypertension BP goal - < 130/80 Explained that having normal blood pressure is the goal and medications are helping to get to goal and maintain normal  blood pressure. DIET: Limit salt intake, read nutrition labels to check salt content, limit fried and high fatty foods  Avoid using multisymptom OTC cold preparations that generally contain sudafed which can rise BP. Consult with pharmacist on best cold relief products to use for persons with HTN EXERCISE Discussed incorporating exercise such as walking - 30 minutes most days of the week and can do in 10 minute intervals       Patient have been counseled extensively about nutrition and exercise. Other issues discussed during this visit include: low  cholesterol diet, weight control and daily exercise, foot care, annual eye examinations at Ophthalmology, importance of adherence with medications and regular follow-up. We also discussed long term complications of uncontrolled diabetes and hypertension.   No follow-ups on file.  The patient was given clear instructions to go to ER or return to medical center if symptoms don't improve, worsen or new problems develop. The patient verbalized understanding. The patient was told to call to get lab results if they haven't heard anything in the next week.   This note has been created with Surveyor, quantity. Any transcriptional errors are unintentional.   Kerin Perna, NP 03/25/2022, 8:00 PM

## 2022-03-25 MED ORDER — CARIPRAZINE HCL 1.5 MG PO CAPS: 1.5000 mg | ORAL_CAPSULE | Freq: Every day | ORAL | 1 refills | Status: DC

## 2022-03-25 MED ORDER — ESCITALOPRAM OXALATE 20 MG PO TABS: 20.0000 mg | ORAL_TABLET | Freq: Every day | ORAL | 1 refills | Status: AC

## 2022-03-28 ENCOUNTER — Encounter (INDEPENDENT_AMBULATORY_CARE_PROVIDER_SITE_OTHER): Payer: Self-pay | Admitting: Primary Care

## 2022-03-28 ENCOUNTER — Other Ambulatory Visit (INDEPENDENT_AMBULATORY_CARE_PROVIDER_SITE_OTHER): Payer: Self-pay | Admitting: Primary Care

## 2022-03-28 ENCOUNTER — Ambulatory Visit (INDEPENDENT_AMBULATORY_CARE_PROVIDER_SITE_OTHER): Payer: Medicaid Other | Admitting: Primary Care

## 2022-03-28 ENCOUNTER — Other Ambulatory Visit: Payer: Self-pay

## 2022-03-28 ENCOUNTER — Telehealth: Payer: Self-pay

## 2022-03-28 MED ORDER — QUETIAPINE FUMARATE 25 MG PO TABS: 25.0000 mg | ORAL_TABLET | Freq: Every day | ORAL | 0 refills | Status: AC

## 2022-03-28 NOTE — Telephone Encounter (Signed)
Prior authorization for Erin Ruiz has been denied by patient's ins. She must have a trial and failure of one of the following:

## 2022-03-29 ENCOUNTER — Other Ambulatory Visit: Payer: Self-pay

## 2022-04-05 ENCOUNTER — Other Ambulatory Visit: Payer: Self-pay

## 2022-05-03 NOTE — Progress Notes (Incomplete)
Erin Ruiz    [id number]    September 03, 1990  Primary Care Physician:Edwards, Milford Cage, NP  Referring Physician: Kerin Perna, NP [place],  [place] [id number]   Chief complaint:  Hemorrhoids  HPI: 32 year old very pleasant female here here with complaints of symptomatic hemorrhoids. Denies any family history of colorectal cancer or inflammatory bowel disease in immediate family.  Maternal grandfather had colon cancer.   I last saw her on 12/08/2021. At that time she complained of symptomatic hemorrhoids. She experienced anorectal itching an had intermittent bright red blood and clear discharge per rectum.  Today,   GI Hx: Transabdominal Ultrasound of Pelvis  01/31/22 Unremarkable examination of the pelvis   Review of Systems     Current Outpatient Medications:    AMBULATORY NON FORMULARY MEDICATION, Using your index finger, apply a small amount of medication inside the rectum up to your first knuckle/joint three times daily x 6-8 weeks., Disp: 30 g, Rfl: 0   benzoyl peroxide 5 % external liquid, Apply topically 2 (two) times daily., Disp: 142 g, Rfl: 12   clotrimazole (LOTRIMIN) 1 % cream, APPLY TO AFFECTED AREA TWICE A DAY, Disp: 30 g, Rfl: 0   escitalopram (LEXAPRO) 20 MG tablet, Take 1 tablet (20 mg total) by mouth daily., Disp: 90 tablet, Rfl: 1   hydrocortisone (ANUSOL-HC) 2.5 % rectal cream, Place 1 Application rectally 2 (two) times daily., Disp: 30 g, Rfl: 0   Norethindrone Acetate-Ethinyl Estrad-FE (BLISOVI 24 FE) 1-20 MG-MCG(24) tablet, Take 1 tablet by mouth daily., Disp: 28 tablet, Rfl: 12   QUEtiapine (SEROQUEL) 25 MG tablet, Take 1 tablet (25 mg total) by mouth at bedtime., Disp: 90 tablet, Rfl: 0   Wheat Dextrin (BENEFIBER) POWD, 1 tablespoon two times per day, Disp: 30 g, Rfl: 0    Allergies as of 05/04/2022   (No Known Allergies)    Past Medical History:  Diagnosis Date   Anal fissure 2017   Depression     Encounter for supervision of other normal pregnancy in first trimester 12/02/2014   Heart murmur    childhood   Hypercholesterolemia    Preeclampsia 06/01/2015    Past Surgical History:  Procedure Laterality Date   TONSILLECTOMY  2004   WISDOM TOOTH EXTRACTION      Family History  Problem Relation Age of Onset   Hypertension Mother    Ovarian cancer Mother    Asthma Father    Colon cancer Maternal Grandfather     Social History   Socioeconomic History   Marital status: Married    Spouse name: Not on file   Number of children: 3   Years of education: Not on file   Highest education level: Not on file  Occupational History   Occupation: pharmacy benifits  Tobacco Use   Smoking status: Former   Smokeless tobacco: Never  Scientific laboratory technician Use: Never used  Substance and Sexual Activity   Alcohol use: Yes    Comment: every other week   Drug use: No   Sexual activity: Yes    Partners: Male    Birth control/protection: Pill  Other Topics Concern   Not on file  Social History Narrative   Right handed   Lives with husband in one story home.   Social Determinants of Health   Financial Resource Strain: Not on file  Food Insecurity: Not on file  Transportation Needs: Not on file  Physical Activity: Not on file  Stress: Not on file  Social Connections: Not on file  Intimate Partner Violence: Not on file      Review of systems: All other review of systems negative except as mentioned in the HPI.   Physical Exam: General: well-appearing ***  Eyes: sclera anicteric, no redness ENT: oral mucosa moist without lesions, no cervical or supraclavicular lymphadenopathy CV: RRR, no JVD, no peripheral edema Resp: clear to auscultation bilaterally, normal RR and effort noted GI: soft, no tenderness, with active bowel sounds. No guarding or palpable organomegaly noted. Skin; warm and dry, no rash or jaundice noted Neuro: awake, alert and oriented x 3. Normal gross  motor function and fluent speech   Data Reviewed:  Reviewed labs, radiology imaging, old records and pertinent past GI work up   Assessment and Plan/Recommendations:  32 year old very pleasant female with anorectal discomfort, itching and discharge, on exam has anal fissure and small hemorrhoids Advised patient to use small pea-sized amount diltiazem 2% gel per rectum 3 times daily for 6 to 8 weeks to heal the anal fissure Use Benefiber 1 tablespoon 2-3 times daily with meals Avoid straining during defecation  If has persistent symptoms or nonhealing fissure, will consider colonoscopy for further evaluation Follow-up in 6 to 8 weeks   The patient was provided an opportunity to ask questions and all were answered. The patient agreed with the plan and demonstrated an understanding of the instructions.  Damaris Hippo , MD    CC: Kerin Perna, NP

## 2022-05-04 ENCOUNTER — Ambulatory Visit: Payer: Medicaid Other | Admitting: Gastroenterology

## 2022-06-04 ENCOUNTER — Ambulatory Visit (INDEPENDENT_AMBULATORY_CARE_PROVIDER_SITE_OTHER): Payer: Medicaid Other | Admitting: Primary Care

## 2022-06-04 ENCOUNTER — Telehealth (INDEPENDENT_AMBULATORY_CARE_PROVIDER_SITE_OTHER): Payer: Self-pay

## 2022-06-04 NOTE — Telephone Encounter (Signed)
Contacted pt because she missed her appt with provider this morning and was seeing if she would like to switch to virtual or reschedule pt didn't answer lvm

## 2022-07-14 IMAGING — MR MR MRA NECK WO/W CM
3 series · 48 of 48 positions shown · IV contrast (multihance)
Comparison: Prior CT from 06/10/2017.

CLINICAL DATA: Initial evaluation for severe headaches for 2-3
months, screening for cerebral aneurysm.

EXAM:
MRI HEAD WITHOUT CONTRAST
MRA HEAD WITHOUT CONTRAST
MRA NECK WITHOUT AND WITH CONTRAST
TECHNIQUE: Multiplanar, multiecho pulse sequences of the brain and surrounding
structures were obtained without intravenous contrast. Angiographic
images of the Circle of Willis were obtained using MRA technique
without intravenous contrast. Angiographic images of the neck were
obtained using MRA technique without and with intravenous contrast.
Carotid stenosis measurements (when applicable) are obtained
utilizing NASCET criteria, using the distal internal carotid
diameter as the denominator.
CONTRAST:  20mL MULTIHANCE GADOBENATE DIMEGLUMINE 529 MG/ML IV SOLN

[Series 5: (id) · axial · 3.0mm · 0.98mm/px · z∈[-156,-37]mm · 15 of 60 slices shown]
[im 1/60]
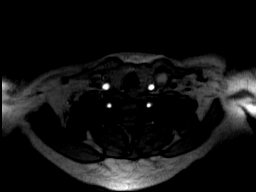
[im 5/60]
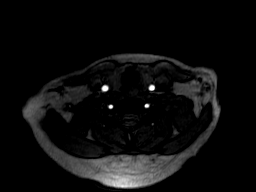
[im 9/60]
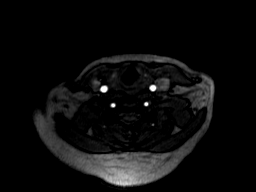
[im 13/60]
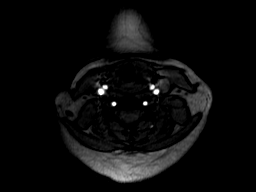
[im 17/60]
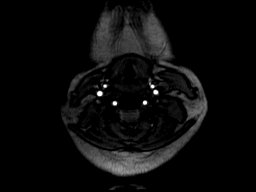
[im 22/60]
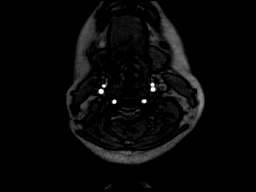
[im 26/60]
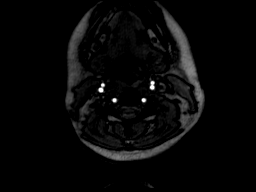
[im 30/60]
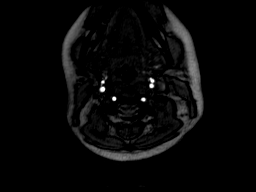
[im 34/60]
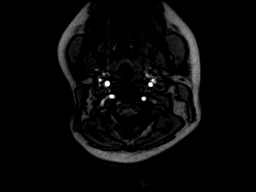
[im 38/60]
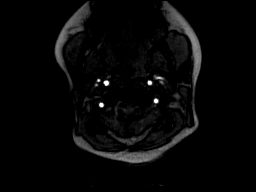
[im 43/60]
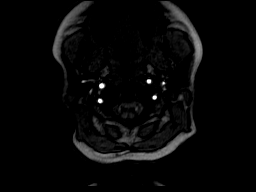
[im 47/60]
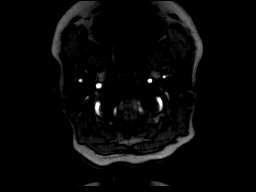
[im 51/60]
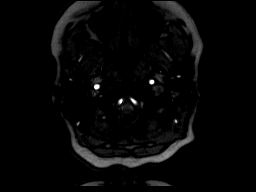
[im 55/60]
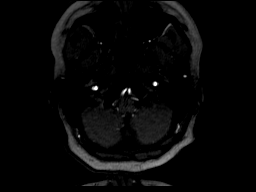
[im 60/60]
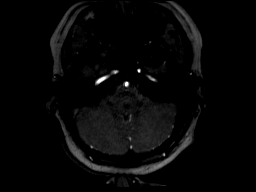

[Series 9: (id)_tt=1.0s · coronal · 0.8mm · 0.78mm/px · 17 of 71 slices shown (1 of 2)]
[im 1/71]
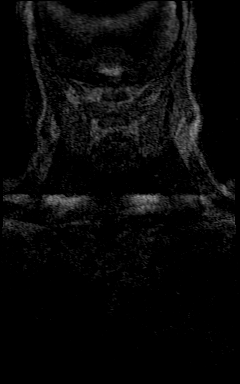
[im 5/71]
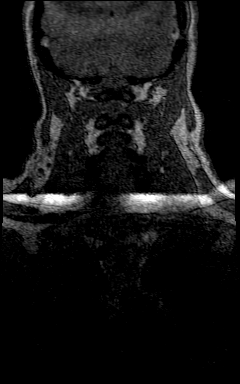
[im 9/71]
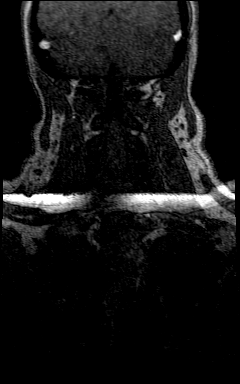
[im 14/71]
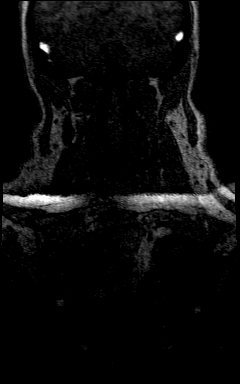
[im 18/71]
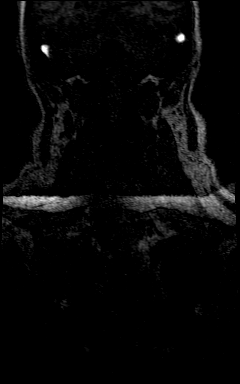
[im 22/71]
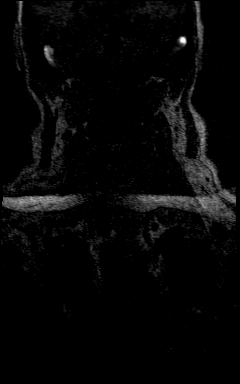
[im 27/71]
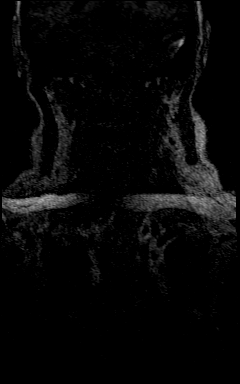
[im 31/71]
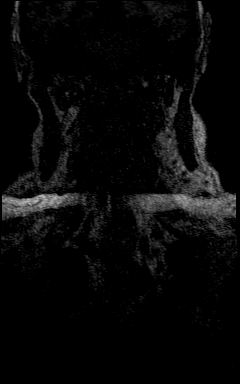
[im 36/71]
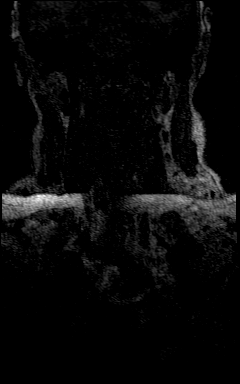
[im 40/71]
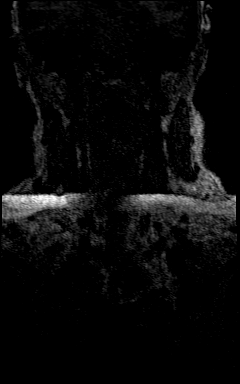
[im 44/71]
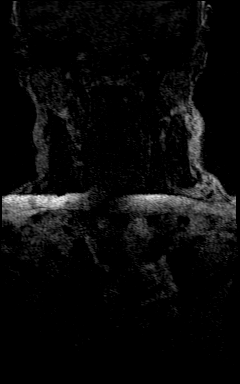
[im 49/71]
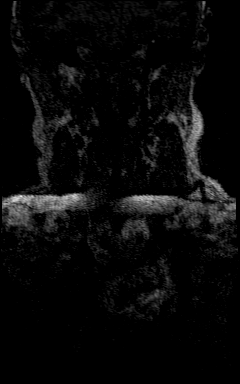
[im 53/71]
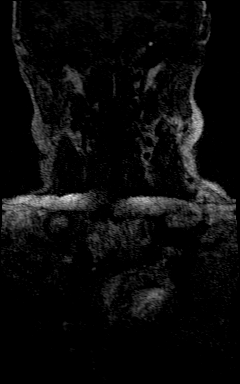
[im 57/71]
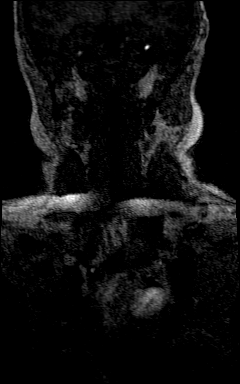
[im 62/71]
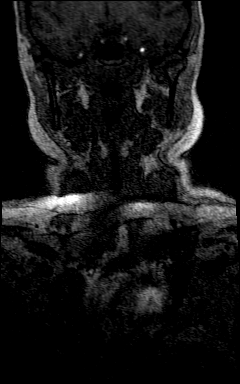
[im 66/71]
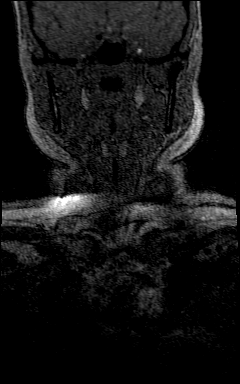
[im 71/71]
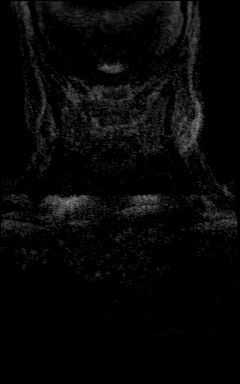

[Series 11: (id)_tt=1.0s · coronal · 0.8mm · 0.78mm/px · 16 of 66 slices shown (2 of 2)]
[im 1/66]
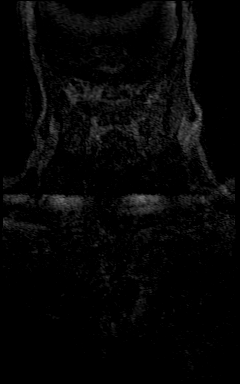
[im 5/66]
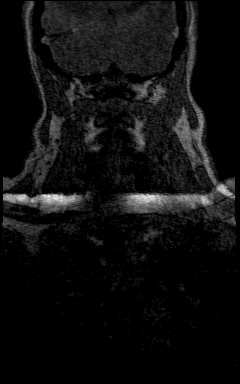
[im 9/66]
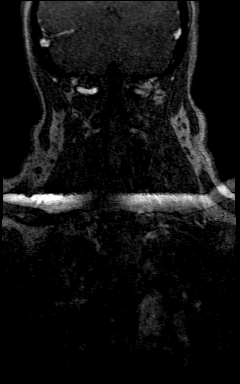
[im 14/66]
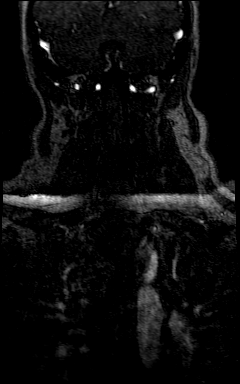
[im 18/66]
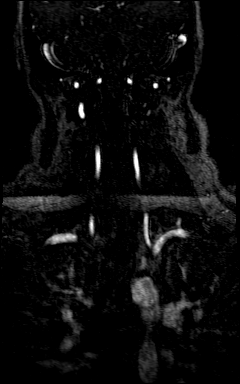
[im 22/66]
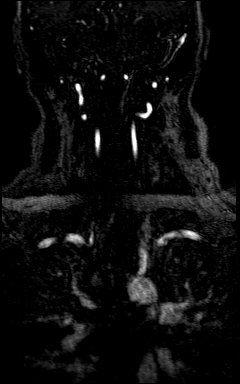
[im 27/66]
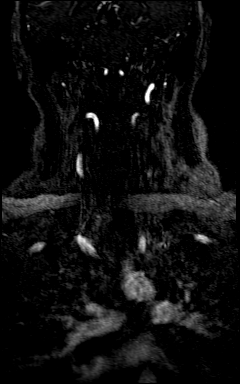
[im 31/66]
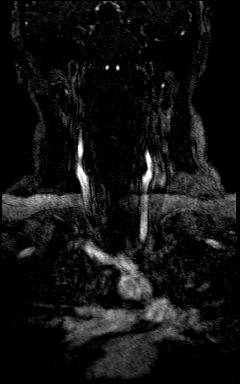
[im 35/66]
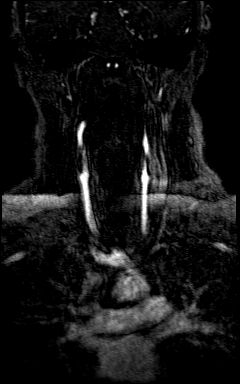
[im 40/66]
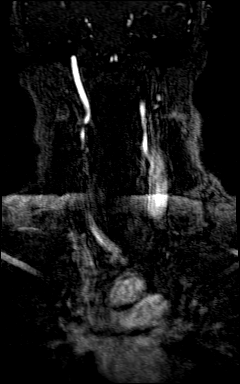
[im 44/66]
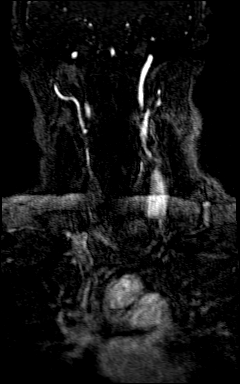
[im 48/66]
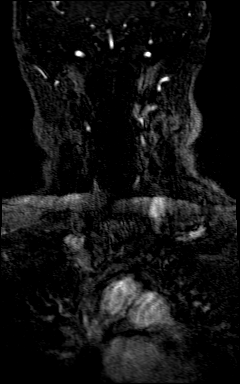
[im 53/66]
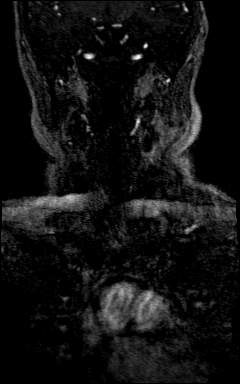
[im 57/66]
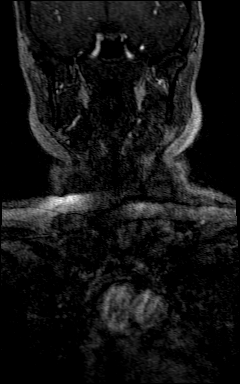
[im 61/66]
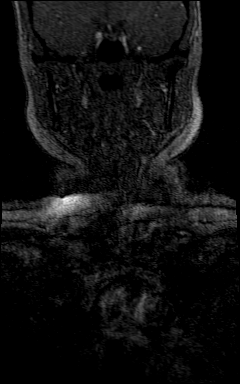
[im 66/66]
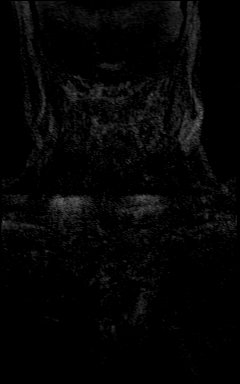

[48 of 48 positions shown; findings below may reference images not displayed]

FINDINGS: MRI HEAD FINDINGS

Brain: Cerebral volume within normal limits for patient age. No
focal parenchymal signal abnormality identified.

No abnormal foci of restricted diffusion to suggest acute or
subacute ischemia. Gray-white matter differentiation well
maintained. No encephalomalacia to suggest chronic infarction. No
foci of susceptibility artifact to suggest acute or chronic
intracranial hemorrhage.

No mass lesion, midline shift or mass effect. No hydrocephalus. No
extra-axial fluid collection. Major dural sinuses are grossly
patent.

Pituitary gland and suprasellar region are normal. Midline
structures intact and normal.

Vascular: Major intracranial vascular flow voids well maintained and
normal in appearance.

Skull and upper cervical spine: Craniocervical junction normal.
Visualized upper cervical spine within normal limits. Bone marrow
signal intensity normal. No scalp soft tissue abnormality.

Sinuses/Orbits: Globes and orbital soft tissues within normal
limits.

Paranasal sinuses are clear. No mastoid effusion. Inner ear
structures normal.

Other: None.

MRA HEAD FINDINGS

ANTERIOR CIRCULATION:

Both internal carotid arteries widely patent to the termini without
stenosis or other abnormality. A1 segments widely patent. Normal
anterior communicating artery complex. Anterior cerebral arteries
widely patent to their distal aspects without stenosis. No M1
stenosis or occlusion. Normal MCA bifurcations. Distal MCA branches
well perfused and symmetric.

POSTERIOR CIRCULATION:

Vertebral arteries largely codominant and widely patent to the
vertebrobasilar junction. Both PICAs patent. Basilar widely patent
to its distal aspect without stenosis. Superior cerebral arteries
patent bilaterally. Both PCAs primarily supplied via the basilar and
are well perfused to their distal aspects.

No intracranial aneurysm or other vascular abnormality.

MRA NECK FINDINGS

AORTIC ARCH: Visualized aortic arch of normal caliber with normal 3
vessel morphology. No hemodynamically significant stenosis about the
origin of the great vessels.

RIGHT CAROTID SYSTEM: Right common and internal carotid arteries
widely patent without stenosis or occlusion. No findings to suggest
dissection. No atheromatous narrowing or irregularity about the
right bifurcation.

LEFT CAROTID SYSTEM: Left common and internal carotid arteries
widely patent without stenosis or occlusion. No findings to suggest
dissection. No atheromatous narrowing or irregularity about the left
bifurcation.

VERTEBRAL ARTERIES: Both vertebral arteries arise from the
subclavian arteries. No proximal subclavian artery stenosis.
Vertebral arteries widely patent within the neck without stenosis or
occlusion or findings to suggest dissection.
IMPRESSION: 1. Normal MRI of the brain. No findings to explain patient's
symptoms identified.
2. Normal intracranial MRA.  No intracranial aneurysm.
3. Normal MRA of the neck.

## 2022-09-13 ENCOUNTER — Telehealth: Payer: Self-pay | Admitting: Emergency Medicine

## 2022-09-13 NOTE — Telephone Encounter (Signed)
Attempted RC to patient, LVM. 

## 2022-09-19 ENCOUNTER — Other Ambulatory Visit: Payer: Self-pay

## 2022-09-19 ENCOUNTER — Encounter: Payer: Self-pay | Admitting: Gastroenterology

## 2022-09-19 ENCOUNTER — Encounter: Payer: Self-pay | Admitting: Obstetrics and Gynecology

## 2022-09-19 DIAGNOSIS — Z30011 Encounter for initial prescription of contraceptive pills: Secondary | ICD-10-CM

## 2022-09-19 MED ORDER — BLISOVI 24 FE 1-20 MG-MCG(24) PO TABS: 1.0000 | ORAL_TABLET | Freq: Every day | ORAL | 12 refills | Status: AC
